# Patient Record
Sex: Female | Born: 1945 | Race: White | Hispanic: No | Marital: Married | Smoking: Never smoker
Health system: Southern US, Community
[De-identification: ages and names within clinical notes are randomized; demographics above are authoritative.]

## PROBLEM LIST (undated history)

## (undated) DIAGNOSIS — E785 Hyperlipidemia, unspecified: Secondary | ICD-10-CM

## (undated) DIAGNOSIS — IMO0001 Reserved for inherently not codable concepts without codable children: Secondary | ICD-10-CM

## (undated) DIAGNOSIS — F419 Anxiety disorder, unspecified: Secondary | ICD-10-CM

## (undated) DIAGNOSIS — I10 Essential (primary) hypertension: Secondary | ICD-10-CM

## (undated) DIAGNOSIS — E559 Vitamin D deficiency, unspecified: Secondary | ICD-10-CM

## (undated) DIAGNOSIS — C4490 Unspecified malignant neoplasm of skin, unspecified: Secondary | ICD-10-CM

## (undated) DIAGNOSIS — M549 Dorsalgia, unspecified: Secondary | ICD-10-CM

## (undated) DIAGNOSIS — E669 Obesity, unspecified: Secondary | ICD-10-CM

## (undated) DIAGNOSIS — F32A Depression, unspecified: Secondary | ICD-10-CM

## (undated) DIAGNOSIS — K219 Gastro-esophageal reflux disease without esophagitis: Secondary | ICD-10-CM

## (undated) DIAGNOSIS — N189 Chronic kidney disease, unspecified: Secondary | ICD-10-CM

## (undated) DIAGNOSIS — R609 Edema, unspecified: Secondary | ICD-10-CM

## (undated) DIAGNOSIS — H269 Unspecified cataract: Secondary | ICD-10-CM

## (undated) DIAGNOSIS — M1990 Unspecified osteoarthritis, unspecified site: Secondary | ICD-10-CM

## (undated) DIAGNOSIS — H532 Diplopia: Secondary | ICD-10-CM

## (undated) DIAGNOSIS — T7840XA Allergy, unspecified, initial encounter: Secondary | ICD-10-CM

## (undated) DIAGNOSIS — R0602 Shortness of breath: Secondary | ICD-10-CM

## (undated) HISTORY — DX: Hyperlipidemia, unspecified: E78.5

## (undated) HISTORY — PX: BREAST BIOPSY: SHX20

## (undated) HISTORY — DX: Gastro-esophageal reflux disease without esophagitis: K21.9

## (undated) HISTORY — DX: Dorsalgia, unspecified: M54.9

## (undated) HISTORY — DX: Edema, unspecified: R60.9

## (undated) HISTORY — PX: POLYPECTOMY: SHX149

## (undated) HISTORY — DX: Shortness of breath: R06.02

## (undated) HISTORY — DX: Allergy, unspecified, initial encounter: T78.40XA

## (undated) HISTORY — DX: Chronic kidney disease, unspecified: N18.9

## (undated) HISTORY — DX: Diplopia: H53.2

## (undated) HISTORY — DX: Unspecified malignant neoplasm of skin, unspecified: C44.90

## (undated) HISTORY — DX: Reserved for inherently not codable concepts without codable children: IMO0001

## (undated) HISTORY — DX: Anxiety disorder, unspecified: F41.9

## (undated) HISTORY — DX: Depression, unspecified: F32.A

## (undated) HISTORY — PX: OTHER SURGICAL HISTORY: SHX169

## (undated) HISTORY — DX: Unspecified cataract: H26.9

## (undated) HISTORY — PX: TOTAL KNEE ARTHROPLASTY: SHX125

## (undated) HISTORY — DX: Unspecified osteoarthritis, unspecified site: M19.90

## (undated) HISTORY — DX: Vitamin D deficiency, unspecified: E55.9

## (undated) HISTORY — DX: Essential (primary) hypertension: I10

## (undated) HISTORY — DX: Obesity, unspecified: E66.9

---
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
## 1997-12-11 HISTORY — PX: KNEE ARTHROSCOPY: SUR90

## 1998-06-16 ENCOUNTER — Ambulatory Visit (HOSPITAL_COMMUNITY): Admission: RE | Admit: 1998-06-16 | Discharge: 1998-06-16 | Payer: Self-pay | Admitting: Obstetrics & Gynecology

## 1999-02-24 ENCOUNTER — Ambulatory Visit (HOSPITAL_BASED_OUTPATIENT_CLINIC_OR_DEPARTMENT_OTHER): Admission: RE | Admit: 1999-02-24 | Discharge: 1999-02-24 | Payer: Self-pay | Admitting: Orthopedic Surgery

## 1999-07-18 ENCOUNTER — Other Ambulatory Visit: Admission: RE | Admit: 1999-07-18 | Discharge: 1999-07-18 | Payer: Self-pay | Admitting: Obstetrics and Gynecology

## 1999-07-25 ENCOUNTER — Ambulatory Visit (HOSPITAL_COMMUNITY): Admission: RE | Admit: 1999-07-25 | Discharge: 1999-07-25 | Payer: Self-pay | Admitting: Obstetrics and Gynecology

## 1999-07-25 ENCOUNTER — Encounter: Payer: Self-pay | Admitting: Obstetrics and Gynecology

## 1999-12-12 HISTORY — PX: ABDOMINAL HYSTERECTOMY: SHX81

## 2000-08-15 ENCOUNTER — Other Ambulatory Visit: Admission: RE | Admit: 2000-08-15 | Discharge: 2000-08-15 | Payer: Self-pay | Admitting: Obstetrics and Gynecology

## 2000-09-18 ENCOUNTER — Encounter: Admission: RE | Admit: 2000-09-18 | Discharge: 2000-09-18 | Payer: Self-pay | Admitting: Occupational Medicine

## 2000-09-18 ENCOUNTER — Encounter: Payer: Self-pay | Admitting: Occupational Medicine

## 2000-09-24 ENCOUNTER — Encounter (INDEPENDENT_AMBULATORY_CARE_PROVIDER_SITE_OTHER): Payer: Self-pay

## 2000-09-24 ENCOUNTER — Inpatient Hospital Stay (HOSPITAL_COMMUNITY): Admission: RE | Admit: 2000-09-24 | Discharge: 2000-09-26 | Payer: Self-pay | Admitting: Obstetrics and Gynecology

## 2001-09-20 ENCOUNTER — Encounter: Payer: Self-pay | Admitting: Family Medicine

## 2001-09-20 ENCOUNTER — Encounter: Admission: RE | Admit: 2001-09-20 | Discharge: 2001-09-20 | Payer: Self-pay | Admitting: Family Medicine

## 2001-11-06 ENCOUNTER — Other Ambulatory Visit: Admission: RE | Admit: 2001-11-06 | Discharge: 2001-11-06 | Payer: Self-pay | Admitting: Obstetrics and Gynecology

## 2002-10-02 ENCOUNTER — Encounter: Payer: Self-pay | Admitting: Family Medicine

## 2002-10-02 ENCOUNTER — Encounter: Admission: RE | Admit: 2002-10-02 | Discharge: 2002-10-02 | Payer: Self-pay | Admitting: Family Medicine

## 2002-11-10 ENCOUNTER — Other Ambulatory Visit: Admission: RE | Admit: 2002-11-10 | Discharge: 2002-11-10 | Payer: Self-pay | Admitting: Obstetrics and Gynecology

## 2004-01-06 ENCOUNTER — Encounter: Admission: RE | Admit: 2004-01-06 | Discharge: 2004-01-06 | Payer: Self-pay | Admitting: Occupational Medicine

## 2005-01-06 ENCOUNTER — Encounter: Admission: RE | Admit: 2005-01-06 | Discharge: 2005-01-06 | Payer: Self-pay | Admitting: Family Medicine

## 2005-12-11 HISTORY — PX: CARDIAC CATHETERIZATION: SHX172

## 2005-12-11 HISTORY — PX: LAPAROSCOPIC CHOLECYSTECTOMY: SUR755

## 2006-01-22 ENCOUNTER — Encounter: Admission: RE | Admit: 2006-01-22 | Discharge: 2006-01-22 | Payer: Self-pay | Admitting: Family Medicine

## 2006-05-01 ENCOUNTER — Observation Stay (HOSPITAL_COMMUNITY): Admission: EM | Admit: 2006-05-01 | Discharge: 2006-05-01 | Payer: Self-pay | Admitting: Emergency Medicine

## 2006-05-20 ENCOUNTER — Emergency Department (HOSPITAL_COMMUNITY): Admission: EM | Admit: 2006-05-20 | Discharge: 2006-05-20 | Payer: Self-pay | Admitting: Emergency Medicine

## 2006-08-01 ENCOUNTER — Ambulatory Visit (HOSPITAL_COMMUNITY): Admission: RE | Admit: 2006-08-01 | Discharge: 2006-08-02 | Payer: Self-pay | Admitting: Surgery

## 2006-08-01 ENCOUNTER — Encounter (INDEPENDENT_AMBULATORY_CARE_PROVIDER_SITE_OTHER): Payer: Self-pay | Admitting: *Deleted

## 2007-01-22 ENCOUNTER — Ambulatory Visit: Payer: Self-pay | Admitting: Internal Medicine

## 2007-01-23 ENCOUNTER — Encounter: Admission: RE | Admit: 2007-01-23 | Discharge: 2007-01-23 | Payer: Self-pay | Admitting: Family Medicine

## 2007-02-07 ENCOUNTER — Ambulatory Visit: Payer: Self-pay | Admitting: Internal Medicine

## 2007-02-07 DIAGNOSIS — K5730 Diverticulosis of large intestine without perforation or abscess without bleeding: Secondary | ICD-10-CM | POA: Insufficient documentation

## 2007-04-20 ENCOUNTER — Emergency Department (HOSPITAL_COMMUNITY): Admission: EM | Admit: 2007-04-20 | Discharge: 2007-04-20 | Payer: Self-pay | Admitting: Emergency Medicine

## 2007-04-24 ENCOUNTER — Encounter: Admission: RE | Admit: 2007-04-24 | Discharge: 2007-04-24 | Payer: Self-pay | Admitting: Family Medicine

## 2007-05-15 ENCOUNTER — Emergency Department (HOSPITAL_COMMUNITY): Admission: EM | Admit: 2007-05-15 | Discharge: 2007-05-15 | Payer: Self-pay | Admitting: Emergency Medicine

## 2007-07-22 ENCOUNTER — Ambulatory Visit: Payer: Self-pay | Admitting: Internal Medicine

## 2007-07-25 ENCOUNTER — Ambulatory Visit: Payer: Self-pay | Admitting: Internal Medicine

## 2007-07-25 ENCOUNTER — Encounter: Payer: Self-pay | Admitting: Internal Medicine

## 2007-07-25 DIAGNOSIS — K2100 Gastro-esophageal reflux disease with esophagitis, without bleeding: Secondary | ICD-10-CM | POA: Insufficient documentation

## 2007-07-25 DIAGNOSIS — K449 Diaphragmatic hernia without obstruction or gangrene: Secondary | ICD-10-CM | POA: Insufficient documentation

## 2008-01-27 ENCOUNTER — Encounter: Admission: RE | Admit: 2008-01-27 | Discharge: 2008-01-27 | Payer: Self-pay | Admitting: Family Medicine

## 2008-03-14 DIAGNOSIS — I10 Essential (primary) hypertension: Secondary | ICD-10-CM | POA: Insufficient documentation

## 2008-03-14 DIAGNOSIS — F411 Generalized anxiety disorder: Secondary | ICD-10-CM | POA: Insufficient documentation

## 2008-03-14 DIAGNOSIS — F329 Major depressive disorder, single episode, unspecified: Secondary | ICD-10-CM | POA: Insufficient documentation

## 2008-03-14 DIAGNOSIS — K219 Gastro-esophageal reflux disease without esophagitis: Secondary | ICD-10-CM | POA: Insufficient documentation

## 2009-07-16 ENCOUNTER — Encounter: Admission: RE | Admit: 2009-07-16 | Discharge: 2009-07-16 | Payer: Self-pay | Admitting: Family Medicine

## 2009-12-11 DIAGNOSIS — IMO0001 Reserved for inherently not codable concepts without codable children: Secondary | ICD-10-CM

## 2009-12-11 HISTORY — DX: Reserved for inherently not codable concepts without codable children: IMO0001

## 2010-05-31 ENCOUNTER — Emergency Department (HOSPITAL_COMMUNITY): Admission: EM | Admit: 2010-05-31 | Discharge: 2010-05-31 | Payer: Self-pay | Admitting: Emergency Medicine

## 2010-06-02 ENCOUNTER — Ambulatory Visit: Payer: Self-pay | Admitting: Cardiology

## 2010-06-02 ENCOUNTER — Observation Stay (HOSPITAL_COMMUNITY): Admission: EM | Admit: 2010-06-02 | Discharge: 2010-06-03 | Payer: Self-pay | Admitting: Emergency Medicine

## 2010-06-03 ENCOUNTER — Encounter (INDEPENDENT_AMBULATORY_CARE_PROVIDER_SITE_OTHER): Payer: Self-pay | Admitting: Emergency Medicine

## 2010-07-18 ENCOUNTER — Encounter: Admission: RE | Admit: 2010-07-18 | Discharge: 2010-07-18 | Payer: Self-pay | Admitting: Family Medicine

## 2010-07-25 ENCOUNTER — Telehealth: Payer: Self-pay | Admitting: Internal Medicine

## 2010-07-25 ENCOUNTER — Encounter (INDEPENDENT_AMBULATORY_CARE_PROVIDER_SITE_OTHER): Payer: Self-pay | Admitting: *Deleted

## 2010-07-27 ENCOUNTER — Ambulatory Visit: Payer: Self-pay | Admitting: Internal Medicine

## 2010-08-05 ENCOUNTER — Ambulatory Visit (HOSPITAL_COMMUNITY): Admission: RE | Admit: 2010-08-05 | Discharge: 2010-08-05 | Payer: Self-pay | Admitting: Internal Medicine

## 2011-01-01 ENCOUNTER — Encounter: Payer: Self-pay | Admitting: Family Medicine

## 2011-01-12 NOTE — Progress Notes (Signed)
Summary: Appt ssoner than next available  Phone Note From Other Clinic   Caller: Raynelle Fanning @ Dr Maurice Small [phone] direct line ok to leave info if no answer Call For: Dr Juanda Chance Summary of Call: Would like pt seen sooner than next available in Nov - PA would be ok. Non Cardiac chest pain, increased Nexium to two times a day with no hlep. Screen for Endo.  Initial call taken by: Leanor Kail [facility],  July 25, 2010 12:35 PM  Follow-up for Phone Call        I have left a voicemail with Raynelle Fanning for appointment 07/27/10 10:15.   Follow-up by: Darcey Nora RN, CGRN,  July 25, 2010 1:51 PM

## 2011-01-12 NOTE — Assessment & Plan Note (Signed)
Summary: non cardiac chest pain-sheri   History of Present Illness Visit Type: Initial Consult Primary GI MD: Lina Sar MD Primary Janard Culp: Maurice Small, MD Requesting Brinley Treanor: Maurice Small, MD Chief Complaint: chest pain non-cardiac, burning intense x 2 months History of Present Illness:   This is a 65 year old white female with recent acute substernal chest pain and left precordial chest pain evaluated in the emergency room. She still has some remaining tenderness in the rib cage. Cardiac causes have been ruled out.  She had noncardiac chest pain in 2008 for which she underwent an upper endoscopy in August 2008 with findings of a 2 cm hiatal hernia and mild esophagitis. She was put on Nexium and Zantac. She continued to take Nexium one a day. On the night before the episode of chest pain, she had a chocolate sundae at [facility] cream shop, went to bed and woke up in the morning with  belching and  pain and indigestion. It reminded her of her gallbladder attack. She responded to a GI cocktail while in the emergency room. She since then has been increased to twice a day Nexium and has not had any recurrence of the pain. There is a family history of colon cancer in her father and sister. Prior colonoscopies in 2003 and 2008 showed mild diverticulosis of the left colon. She had a laparoscopic cholecystectomy in 2007.   GI Review of Systems    Reports acid reflux, belching, chest pain, and  nausea.      Denies abdominal pain, bloating, dysphagia with liquids, dysphagia with solids, heartburn, loss of appetite, vomiting, vomiting blood, weight loss, and  weight gain.      Reports constipation and  diarrhea.     Denies anal fissure, black tarry stools, change in bowel habit, diverticulosis, fecal incontinence, heme positive stool, hemorrhoids, irritable bowel syndrome, jaundice, light color stool, liver problems, rectal bleeding, and  rectal pain.    Current Medications (verified): 1)   Quinapril Hcl 40 Mg Tabs (Quinapril Hcl) .... Take 1 Tablet Once Daily 2)  Amlodipine Besylate 10 Mg Tabs (Amlodipine Besylate) .... Take 1 Tablet By Mouth Once Daily 3)  Metoprolol Tartrate 25 Mg Tabs (Metoprolol Tartrate) .... Take  1 Tablet By Mouth Once Daily 4)  Nexium 40 Mg Cpdr (Esomeprazole Magnesium) .... Two Times A Day 5)  Ibuprofen 400 Mg Tabs (Ibuprofen) .... As Needed  Allergies (verified): 1)  ! Penicillin  Past History:  Past Medical History: Reviewed history from 03/14/2008 and no changes required. Current Problems:  GERD (ICD-530.81) DEPRESSION (ICD-311) ANXIETY (ICD-300.00) HYPERTENSION (ICD-401.9) ESOPHAGITIS, REFLUX (ICD-530.11) HIATAL HERNIA (ICD-553.3) Family Hx of ADENOCARCINOMA, COLON, FAMILY HX (ICD-V16.0) DIVERTICULOSIS, COLON (ICD-562.10)  Past Surgical History: appendectomy cholecystectomy Hysterectomy  Family History: Reviewed history from 07/25/2010 and no changes required. Family History of Colon Cancer: Father, Sister Family History of Colon Polyps: Brother, Sister Family History of Heart Disease: Mother, Father Family History of Prostate Cancer: Father Family History of Diabetes:   Social History: Alcohol Use - no Illicit Drug Use - no Occupation: Retired Daily Caffeine Use  Review of Systems       The patient complains of anxiety-new, arthritis/joint pain, back pain, depression-new, fatigue, headaches-new, itching, and swelling of feet/legs.  The patient denies allergy/sinus, anemia, blood in urine, breast changes/lumps, change in vision, confusion, cough, coughing up blood, fainting, fever, hearing problems, heart murmur, heart rhythm changes, menstrual pain, muscle pains/cramps, night sweats, nosebleeds, pregnancy symptoms, shortness of breath, skin rash, sleeping problems, sore throat,  swollen lymph glands, thirst - excessive , urination - excessive , urination changes/pain, urine leakage, vision changes, and voice change.          Pertinent positive and negative review of systems were noted in the above HPI. All other ROS was otherwise negative.   Vital Signs:  Patient profile:   65 year old female Height:      66 inches Weight:      239.50 pounds BMI:     38.80 Pulse rate:   64 / minute Pulse rhythm:   regular BP sitting:   122 / 80  (left arm) Cuff size:   large  Vitals Entered By: June McMurray CMA Duncan Dull) (July 27, 2010 10:23 AM)  Physical Exam  General:  alert, oriented and in no distress, overweight. Eyes:  PERRLA, no icterus. Mouth:  No deformity or lesions, dentition normal. Neck:  Supple; no masses or thyromegaly. Chest Wall:  mild tenderness over the costochondral junctions in the left precordial tissues including the breasts. Lungs:  Clear throughout to auscultation. Heart:  Regular rate and rhythm; no murmurs, rubs,  or bruits. Abdomen:  soft protuberant abdomen but no tenderness. No mass. Normoactive bowel sounds. Left lower and right lower quadrants unremarkable. Extremities:  No clubbing, cyanosis, edema or deformities noted. Skin:  Intact without significant lesions or rashes. Psych:  Alert and cooperative. Normal mood and affect.   Impression & Recommendations:  Problem # 1:  GERD (ICD-530.81)  Patient has noncardiac chest pain almost certainly due to gastroesophageal reflux based on the symptoms and the history  preceding the episode .The fact that cardiac causes have been ruled out. She had a small hiatal hernia 3 years ago. She might have developed esophageal spasms as a result of the reflux or possibly have an enlarged hiatal hernia. I will schedule for for a barium esophagram to assess the esophageal motility, rule out spasm and also quantitate the reflux. She will stay on Nexium 40 mg twice a day for now and I have given her samples of it. I have also discussed head of the bed elevation at night and antireflux measures in general, weight loss included. I would not rush into a Nissen  fundoplication because she can improve her lifestyle and reduce her reflux without surgical intervention. Should we decide for Nissen fundoplication, she would need esophageal motility testing and re endoscopy.   Orders: Barium Swallow (Barium Swallow)  Problem # 2:  Family Hx of ADENOCARCINOMA, COLON, FAMILY HX (ICD-V16.0) Patient will be due for a recall colonoscopy in February 2013.  Patient Instructions: 1)  Continue Nexium 40 mg twice a day. 2)  Strict antireflux measures. 3)  Barium esophagram. 4)  Recall colonoscopy February 2013. 5)  Copy sent to : Dr Maurice Small 6)  The medication list was reviewed and reconciled.  All changed / newly prescribed medications were explained.  A complete medication list was provided to the patient / caregiver.  Appended Document: non cardiac chest pain-sheri please call pt with results, normal exam, no significant reflux noted during this particular exam. Continue Nexium, please ( two times a day). If Sx,s continue, will need upper endoscopy. She needs to call us within 4-6 weeks.  Appended Document: non cardiac chest pain-sheri Patient notified of radiology results and Dr Regino Schultze recommendations.  She will call back with an update.

## 2011-01-12 NOTE — Progress Notes (Signed)
Summary: Office Visit- [facility]  Office Visit- [facility]   Imported By: Harlow Mares CMA (AAMA) 07/27/2010 10:31:20  _____________________________________________________________________  External Attachment:    Type:   Image     Comment:   External Document

## 2011-02-26 LAB — DIFFERENTIAL
Basophils Absolute: 0 10*3/uL (ref 0.0–0.1)
Lymphocytes Relative: 23 % (ref 12–46)
Monocytes Absolute: 0.4 10*3/uL (ref 0.1–1.0)
Monocytes Relative: 5 % (ref 3–12)

## 2011-02-26 LAB — CBC: Platelets: 226 10*3/uL (ref 150–400)

## 2011-02-26 LAB — POCT CARDIAC MARKERS
CKMB, poc: <1 ng/mL — ABNORMAL LOW (ref 1.0–8.0)
CKMB, poc: <1 ng/mL — ABNORMAL LOW (ref 1.0–8.0)
CKMB, poc: <1 ng/mL — ABNORMAL LOW (ref 1.0–8.0)
Myoglobin, poc: 34.1 ng/mL (ref 12–200)
Myoglobin, poc: 56.3 ng/mL (ref 12–200)
Myoglobin, poc: 59.7 ng/mL (ref 12–200)
Troponin i, poc: <0.05 ng/mL (ref 0.00–0.09)
Troponin i, poc: <0.05 ng/mL (ref 0.00–0.09)
Troponin i, poc: <0.05 ng/mL (ref 0.00–0.09)

## 2011-02-26 LAB — POCT I-STAT, CHEM 8
Calcium, Ion: 1.04 mmol/L — ABNORMAL LOW (ref 1.12–1.32)
Chloride: 106 mEq/L (ref 96–112)
Chloride: 108 mEq/L (ref 96–112)
Glucose, Bld: 132 mg/dL — ABNORMAL HIGH (ref 70–99)
Glucose, Bld: 140 mg/dL — ABNORMAL HIGH (ref 70–99)
HCT: 45 % (ref 36.0–46.0)
HCT: 46 % (ref 36.0–46.0)
Potassium: 3.9 mEq/L (ref 3.5–5.1)
Potassium: 4 mEq/L (ref 3.5–5.1)
TCO2: 20 mmol/L (ref 0–100)

## 2011-04-25 NOTE — Assessment & Plan Note (Signed)
[facility]                         GASTROENTEROLOGY OFFICE NOTE   NAME:[name], [name]                    MRN:          [id number]  DATE:07/22/2007                            DOB:          [birth year]    Ms. Heese is a very nice, 65 year old patient of Dr. Valentina Lucks whom we  saw in the past for colorectal screening. She has a positive family  history of colon cancer in her father and sister. Her last colonoscopy  in February 2008 showed diverticulosis of the left colon but no polyps.  She is here today because of episode of epigastric pain which occurred  in June 2008. It occurred at night as well as during the day and it was  a burning sensation. She has been under a lot of stress with her aging  mother and took some Xanax to relax but the pain continued. Dr. Valentina Lucks  referred her for a cardiology consultation and she was seen by Dr.  Elsie Lincoln. Her coronary arteries were normal on cardiac catheterization in  the past. She underwent a laparoscopic cholecystectomy for acute  cholecystitis by Dr. Daphine Deutscher in August 2007. The pain in June of this  year was similar to the gallbladder pain. She has decreased her fat  intake and modified her diet. She also followed Dr. Jone Baseman  recommendations to add ranitidine 150 mg to her regimen of Nexium 40 mg  a day. This seemed to have helped some although she still had some  discomfort in the epigastrium. She does not smoke but takes ibuprofen 2  at a time several times a week for headaches.   MEDICATIONS:  1. Quinapril 40 mg p.o. daily.  2. Metoprolol 25 mg p.o. daily.  3. Nexium 40 mg p.o. daily.  4. Ranitidine 150 mg p.o. daily.   PAST MEDICAL HISTORY:  Significant for high blood pressure, anxiety,  depression.   PAST SURGICAL HISTORY:  Laparoscopic cholecystectomy and appendectomy.   FAMILY HISTORY:  Positive for colon cancer in father and sister. Colon  polyps in brother and sister. Prostate cancer in  father. Heart disease  in mother and father.   SOCIAL HISTORY:  Married with one child. Retired as a Arboriculturist. She does not smoke and does not drink alcohol.   REVIEW OF SYSTEMS:  Positive for eyeglasses,  frequent cough, arthritic  complaints.   PHYSICAL EXAMINATION:  Blood pressure 158/90, pulse 60 and weight 238  pounds.  She was alert and oriented in no distress. She was clearly overweight.  LUNGS:  Clear to auscultation.  COR:  Normal S1, normal S2.  ABDOMEN:  Protuberant, soft, very tender in subxiphoid and midline  epigastric area not radiating to the left or right upper quadrant. The  lower abdomen was normal. Post cholecystectomy, a surgical scar was  around the umbilicus.  EXTREMITIES:  Trace edema.   IMPRESSION:  A 65 year old white female with epigastric discomfort  status post remote laparoscopic cholecystectomy. Her pain is now  suggestive of gastritis because of its location in the midline, although  I cannot entirely rule out the possibility of retained common bile duct  stone. Her intraoperative cholangiogram after lap chole was normal. Her  pain is really more consistent with ibuprofen-induced gastropathy or  possibly due to hiatal hernia with reflux.   PLAN:  1. Upper endoscopy scheduled.  2. Nexium 40 mg at bedtime and Zantac 150 mg q.a.m.  3. Avoid ibuprofen.  4. Antireflux measures and dietary modifications. Also change the      eating time at the end of the day to give her at least 2 hours      prior to laying down. Depending on the results of the upper      endoscopy, I would consider adding Reglan to the regimen.     Hedwig Morton. Juanda Chance, MD  Electronically Signed    DMB/MedQ  DD: 07/22/2007  DT: 07/23/2007  Job #: [id number]   cc:   Gretta Arab. Valentina Lucks, M.D.

## 2011-04-28 NOTE — Op Note (Signed)
[facility]  Patient:    [name], [name]            MRN: [id number] Proc. Date: 09/24/00 Adm. Date:  [id number] Attending:  Oliver Pila                           Operative Report  PREOPERATIVE DIAGNOSES: 1. Menorrhagia. 2. Fibroid uterus. 3. History of pelvic inflammatory disease with Dalkon shield.  POSTOPERATIVE DIAGNOSES: 1. Menorrhagia. 2. Fibroid uterus. 3. History of pelvic inflammatory disease with Dalkon shield. 4. Extensive pelvic adhesions.  PROCEDURE:  Total abdominal hysterectomy and bilateral salpingo-oophorectomy.  SURGEON:  Alvino Chapel, M.D.  ASSISTANT:  Malachi Pro. Ambrose Mantle, M.D.  ANESTHESIA:  General.  ESTIMATED BLOOD LOSS:  400 cc.  URINE OUTPUT:  Approximately 150 cc of clear urine.  IV FLUIDS:  Approximately 2500 cc LR.  FINDINGS:  The uterus had a large _______ fibroid, approximately 5 cm, and there was a bilateral hydrosalpinx noted with the tubes and ovaries scarred into the pelvis and the cul-de-sac.  There were also some large bowel adhesions to the adnexa and uterus.  DESCRIPTION OF PROCEDURE:  The patient was taken to the operating room where general anesthesia was obtained without difficulty.  She was then prepped and draped in the normal sterile fashion in the dorsal supine position.  A Pfannenstiel skin incision was then made with the scalpel and carried through to the underlying layered fascia with Bovie cautery.  The fascia was then nicked in the midline and the incision was extended laterally with Mayo scissors.  The inferior aspect of the fascia was then grasped with the Kocher clamps, elevated, and dissected off the underlying rectus muscles.  In a similar fashion, the superior aspect of the fascia was elevated with Kocher clamps and dissected off the rectus muscles.  The rectus muscles were then separated in the midline, the peritoneum identified, grasped with hemostats x 2, and  entered sharply with Metzenbaum scissors.  The peritoneal incision was then extended both superiorly and inferiorly with careful attention to avoid both bowel and bladder.  The Balfour retractor was then placed within the incision and the bowel packed away with moist laparotomy sponges.  With the bladder blade in place, the pelvis was inspected with the findings of adhesions as previously stated.  Initially, the adhesions of the descending colon were taken down with Metzenbaum scissors from the patients left adnexal region.  This freed the left hydrosalpinx significantly and it was able to be elevated.  Two curved Kelly clamps were then placed on the uterine cornua and the uterus was elevated slightly.  The patients right round ligament was then transected with Bovie cautery and the retroperitoneal space opened laterally.  The infundibulopelvic ligament was isolated on the patients right, doubly clamped with curved Zeppelin clamps, and suture ligated x 2 with 0 Vicryl.  This pedicle was hemostatic and attention was turned to the right adnexa.  The right adnexa was quite adherent into the cul-de-sac and could not be freed at this time.  Attention was then turned to the patients left, where the round ligament was transected with Bovie cautery in a similar fashion and the retroperitoneal space opened with Bovie cautery.  The infundibulopelvic ligament was also able to be isolated with two ________ which was then transected and suture ligated x 2 with 0 Vicryl.  The bladder flap was then initially developed with Metzenbaum scissors and the uterine arteries skeletonized.  The uterine arteries were then clamped bilaterally, transected, and suture ligated with 0 Vicryl.  At this point, secondary to poor visibility, the decision was made to proceed with removal of the uterine fundus which was trimmed away with Jorgenson scissors and the cervix was grasped with straight Kocher clamps.  The  bladder flap was then further developed with some scarring noted on the anterior surface of the cervix.  However, this was adequately taken down, and the remainder of the cervix was removed with straight Zeppelin clamps in a sequential fashion along the lateral edge at each step.  There was a ligature of 0 Vicryl.  At the level of the vaginal cuff, the cervix was clamped across with right angle Zeppelin clamps and trimmed away with Jorgenson scissors. The vaginal cuff was then secured with 0 Vicryl sutures at each angle, and several additional figure-of-eight sutures were placed in the middle to assure adequate hemostasis.  Attention was then turned to an area of mesentery which was bleeding.  This was grasped with a right angle clamp and suture ligated with 3-0 Vicryl with good hemostasis noted.  Finally, attention was turned to the patients right adnexa.  The retroperitoneal space was further opened and ureter clearly identified.  The right adnexa was then able to be bluntly removed without difficulty.  This was handed off with the specimen and the abdomen was irrigated x 2.  There was no significant active bleeding.  Some small areas of oozing were controlled with Bovie cautery.  The infundibulopelvic ligaments were once again inspected and found to be hemostatic, and the mesenteric area then suture ligated and was also hemostatic.  Therefore, all sponge and instruments were removed from the abdomen and the omentum pulled over the intestine.  The fascia was then closed with 0 Vicryl in a running fashion and the skin was closed with staples.  Sponge, lap, and needle counts were correct x 2, and the patient was taken to the recovery room in stable condition. DD:  09/24/00 TD:  09/24/00 Job: [id number]

## 2011-04-28 NOTE — Discharge Summary (Signed)
[facility]  Patient:    [name], REIGER            MRN: [id number] Adm. Date:  [id number] Disc. Date: 09/26/00 Attending:  Oliver Pila                           Discharge Summary  DISCHARGE DIAGNOSES: 1. Status post total abdominal hysterectomy and bilateral salpingo-oophorectomy. 2. Menorrhagia. 3. Fibroid uterus. 4. History of pelvic inflammatory disease.  DISCHARGE MEDICATIONS: 1. Motrin 600 mg p.o. q.6h. p.r.n. 2. Percocet one to two tablets p.o. q.4h. p.r.n. 3. Premarin one tablet 0.625 mg p.o. q.d.  DISCHARGE FOLLOWUP:  The patient is to follow up in my office on Friday, October 19 for staple removal.  HOSPITAL COURSE:  The patient is a 65 year old, G1, P1, who is admitted for a total abdominal hysterectomy and bilateral salpingo-oophorectomy secondary to significant and longstanding menorrhagia which failed oral management and previous D & C.  She underwent a total abdominal hysterectomy and bilateral salpingo-oophorectomy on September 24, 2000, without difficulty and was then admitted for routine postoperative care.  She did very well postoperatively. On postop day #2, she was tolerating regular diet, passing flatus, her pain was well controlled, and she was voiding without difficulty.  She was afebrile with stable vital signs.  Her incision was well approximated with no erythema or exudate; therefore, she was discharged to home with follow-up and prescriptions as previously stated.  DISCHARGE CONDITION:  Improved.  DISCHARGE LABORATORIES:  Postoperative hemoglobin was 10.6, hematocrit 30.4. DD:  09/26/00 TD:  09/26/00 Job: [id number]

## 2011-04-28 NOTE — H&P (Signed)
[facility]  Patient:    [name], [name]                      MRN: [id number] Adm. Date:  09/24/00 Attending:  Alvino Chapel, M.D. CC:         [facility] OR 09/24/00   History and Physical  PREOPERATIVE HISTORY AND PHYSICAL  HISTORY OF PRESENT ILLNESS:  The patient is a 65 year old G1, P1, who is admitted for a scheduled total abdominal hysterectomy and bilateral salpingo-oophorectomy given a long history of menorrhagia and some dysmenorrhea as well.  In the past, patient has been treated with D&C and oral contraceptives which failed to control her problem.  Furthermore, on oral contraceptive pills, patient had severe migraine headaches.  PAST MEDICAL HISTORY:  None.  PAST SURGICAL HISTORY:  She had a knee surgery in March 2000, a [facility] July 1999 and an appendectomy.  PAST GYNECOLOGIC HISTORY:  There are no abnormal Pap smears.  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  PAST OBSTETRICAL HISTORY:  Normal spontaneous vaginal delivery.  FAMILY HISTORY:  History of colon cancer in the father.  No family history of breast cancer.  MEDICATIONS:  The patient has no medications.  SOCIAL HISTORY:  She is a nonsmoker.  ALLERGIES:  PENICILLIN only.  PHYSICAL EXAMINATION:  VITAL SIGNS:  Weight 233 pounds.  Blood pressure 155/90.  CARDIAC:  Regular rate and rhythm.  LUNGS:  Clear to auscultation bilaterally.  ABDOMEN:  Slightly obese, nontender.  PELVIC:  Exam reveals normal external genitalia.  Cervix is without lesions. Uterus is approximately 12-weekssize, retroverted with very little descensus.  Adnexa are nonpalpable.  RECTAL:  Heme negative.  BREASTS:  Exam was normal with no masses, adenopathy or discharge.  LABORATORY DATA:  Hemoglobin 13.6.  ASSESSMENT/PLAN:  The patient was counseled as to all available options, given history of fibroid uterus and menorrhagia which  has been unresponsive to dilatation and curettage and oral contraceptive therapy.  Given that the uterus is greater than 10 weeks in size and there are distorting fibroids, it is unlikely that she will benefit from ThermaChoice ablation.  Therefore, the patient requests proceed with hysterectomy.  As a 65 year old woman, she elects to have an oophorectomy at the time of her hysterectomy and will proceed with a total abdominal hysterectomy given her history of pelvic inflammatory disease and a ruptured appendix with significant scarring possible.  The patient was counseled as to the risks and benefits of the surgery including bleeding, infection and possible damage to bowel and bladder.  The patient understands these risks and agrees to proceed. DD:  09/21/00 TD:  09/21/00 Job: [id number]

## 2011-04-28 NOTE — Consult Note (Signed)
NAMEJONQUIL, [name]             ACCOUNT NO.:  [id number]   MEDICAL RECORD NO.:  [id number]          PATIENT TYPE:  INP   LOCATION:  1824                         FACILITY:  [facility]   PHYSICIAN:  [name], M.D.DATE OF BIRTH:  May 11, 1946   DATE OF CONSULTATION:  05/01/2006  DATE OF DISCHARGE:                                   CONSULTATION   CHIEF COMPLAINT:  Left subscapular pain.   HISTORY OF PRESENT ILLNESS:  Ms. [name] is a 65 year old female whom we  were asked to see in consult in the emergency room from the [name]  group for left subscapular pain.  The patient complains of localized left  scapular pain off and on for the last 5 days.  Symptoms have increased over  the last 24 hours.  This morning, she felt funny with some flushing and  nausea.  She denies any shortness of breath or diaphoresis.  In the  emergency room, her exam is notable for tachycardia.  Initially, her blood  pressure was elevated by EMS, but is now normal.  The patient denies any  aggravation of her symptoms with exercise, and, in fact, her symptoms get  better when she walks.  She has taken Tums with intermittent relief, but not  this morning.   PAST MEDICAL HISTORY:  1.  Remarkable for dyslipidemia, which is better with Omega 3 treatment.  2.  She is status post hysterectomy in 2001.  3.  She is status post remote appendectomy and tonsillectomy.  4.  Her blood pressure has been somewhat labile in the past, but this has      never been treated.   ALLERGIES:  PENICILLIN, which has caused a rash.   SOCIAL HISTORY:  She has been married for 37 years.  She has never smoked.  She is retired from [employer] work.  She has 1 child and 2 grandchildren.   FAMILY HISTORY:  Remarkable in that her mother has a history of coronary  disease and is followed by Dr. Clarene [name].   REVIEW OF SYSTEMS:  Essentially unremarkable, except as noted above.  She  denies any thyroid problems, diabetes, GI bleeding  or kidney problems.  She  has had some tachycardia today when she was seen by EMS, but admits she was  anxious at that time.  She has not had syncope.   PHYSICAL EXAMINATION:  VITAL SIGNS:  Blood pressure 110/55, pulse 75,  temperature 89, respirations 12.  GENERAL:  She is a well-developed, well-nourished female, somewhat anxious,  but in no acute distress.  HEENT:  Normocephalic.  Extraocular movements are intact.  Sclerae are  nonicteric.  She does wear glasses.  NECK:  Without JVD and without bruit.  CHEST:  Clear to auscultation and percussion.  CARDIAC:  Regular rate and rhythm without obvious murmur, rub, or gallop.  Normal S1 and S2.  ABDOMEN:  The abdomen is not tender.  No hepatosplenomegaly.  EXTREMITIES:  Without edema.  Distal pulses are 2+/4.  NEUROLOGIC:  Grossly intact.  She is awake, alert, oriented and cooperative.  Moves all extremities without obvious deficit.  SKIN:  Warm and dry.   ELECTROCARDIOGRAM:  Sinus rhythm without acute changes.   LABORATORY DATA:  Essentially normal.   CHEST X-RAY:  No active disease.   IMPRESSION:  1.  Left scapular pain, rule out angina.  2.  Family history of coronary disease.  3.  History of dyslipidemia.   PLAN:  The patient was seen by Dr. Elsie [name] in the emergency room.  Outpatient  stress test would take at least a couple weeks.  Dr. Elsie [name] has offered her  a diagnostic catheterization this afternoon to clarify the issue.  The  patient is considering this.      [name], P.A.    ______________________________  [name], M.D.    [name]  D:  05/01/2006  T:  05/01/2006  Job:  [id number]

## 2011-04-28 NOTE — Discharge Summary (Signed)
[name], [name]             ACCOUNT NO.:  [id number]   MEDICAL RECORD NO.:  [id number]          PATIENT TYPE:  OBV   LOCATION:  2852                         FACILITY:  [facility]   PHYSICIAN:  [name], M.D.DATE OF BIRTH:  1946/07/23   DATE OF ADMISSION:  05/01/2006  DATE OF DISCHARGE:  05/01/2006                                 DISCHARGE SUMMARY   DISCHARGE DIAGNOSES:  1.  Subscapular pain worrisome for unstable angina, catheterization showing      normal coronaries and normal left ventricular function.  2.  Family history of coronary artery disease.  3.  Treated dyslipidemia.  4.  History of hypertension.   HOSPITAL COURSE:  The patient is a 65 year old female followed by Dr. Maurice Small who was admitted to the emergency room with left subscapular pain.  Please see admission history and physical for complete details.  She does  have risk factors for coronary artery disease.  She was seen by Dr. Elsie Lincoln,  and it was decided to proceed with a diagnostic catheterization.  Her  troponin was 0.05.  Her EKG showed nonspecific ST changes.  The patient was  given Lovenox once in the emergency room.  Catheterization done on May 01, 2006 by Dr. gamble revealed normal coronaries and normal LV function.  She  was discharged later on May 01, 2006.   DISCHARGE MEDICATIONS:  Prilosec OTC.   LABORATORIES:  EKG showed sinus rhythm, with nonspecific ST changes.  Chest  x-ray showed no pulmonary disease.  Sodium 139, potassium 3.6, BUN 12,  creatinine 0.8.  CK-MB and troponin are negative. Hemoglobin 15.6,  hematocrit 46.  Urinalysis unremarkable   DISPOSITION:  The patient is discharged stable condition.  She will follow  up with her family doctor as an outpatient.  We will see her on a p.r.n.  basis.      Abelino Derrick, P.A.    ______________________________  [name], M.D.    Lenard Lance  D:  05/23/2006  T:  05/23/2006  Job:  [id number]   cc:   Gretta Arab. Valentina Lucks,  M.D.  Fax: [phone]

## 2011-04-28 NOTE — Op Note (Signed)
[name], [name]             ACCOUNT NO.:  [id number]   MEDICAL RECORD NO.:  [id number]          PATIENT TYPE:  OIB   LOCATION:  1608                         [facility]ACILITY:  [facility]   PHYSICIAN:  Thornton Park. Daphine Deutscher, MD  DATE OF BIRTH:  [birth year]   DATE OF PROCEDURE:  08/01/2006  DATE OF DISCHARGE:                                 OPERATIVE REPORT   PREOPERATIVE DIAGNOSIS:  Chronic cholecystitis.   POSTOPERATIVE DIAGNOSIS:  Chronic cholecystitis.   PROCEDURE:  Laparoscopic cholecystectomy, intraoperative cholangiogram,  normal intraoperative cholangiogram.   SURGEON:  Thornton Park. Daphine Deutscher, MD   ASSISTANT:  Wilmon Arms. Tsuei, M.D.   ANESTHESIA:  General.   DESCRIPTION OF PROCEDURE:  [name] was taken to OR #1 August 01, 2006, given general anesthesia.  The abdomen was prepped with chlorhexidine  and draped sterilely.  A longitudinal incision was made down to the  umbilicus and the abdomen was entered without difficulty.  The abdomen was  insufflated through the Hasson and then three ports placed in he upper  abdomen.  The gallbladder was elevated, mobilized, and the cystic duct  dissected free.  I incised and inserted the Reddick catheter.  A dynamic  cholangiogram was done, which showed good intrahepatic filling and free flow  into the duodenum.  The cystic duct was then triple-clipped and divided, the  cystic artery was triple-clipped and divided, and the gallbladder is removed  without entering it from the gallbladder bed with the hook electrocautery.  It was placed in a bag and brought out through the umbilicus, although I did  have to enlarge my incision in the umbilicus because she had about at least  a 1.5 cm stone.  I then I went back in and surveyed the area and no bleeding  was noted.  No bile leaks were noted.  I repaired the umbilical defect with  the laparoscopic the upper position so I could watch myself and I got a good  closure of that.  The abdomen was  deflated, port sites were removed and  closed with 4-0 Vicryl, Benzoin and Steri-Strips.  The patient tolerated the  procedure well taken, was taken to the recovery room in satisfactory  addition.      Thornton Park Daphine Deutscher, MD  Electronically Signed     MBM/MEDQ  D:  08/01/2006  T:  08/01/2006  Job:  [id number]   cc:   Gretta Arab. Valentina Lucks, M.D.  Fax: [phone]

## 2011-04-28 NOTE — H&P (Signed)
[name], [name]             ACCOUNT NO.:  [id number]   MEDICAL RECORD NO.:  [id number]          PATIENT TYPE:  EMS   LOCATION:  MAJO                         FACILITY:  [facility]   PHYSICIAN:  Kela Millin, M.D.DATE OF BIRTH:  04-29-1946   DATE OF ADMISSION:  05/01/2006  DATE OF DISCHARGE:                                HISTORY & PHYSICAL   PRIMARY CARE PHYSICIAN:  Dr. Maurice Small.   CHIEF COMPLAINT:  Chest pain.   HISTORY OF PRESENT ILLNESS:  The patient is a 65 year old obese white female  with a past medical history significant for hyperlipidemia, who presents  with complaints of chest pain.  She states that initially, she had a pain in  her left shoulder blade area, and this radiated up to her neck.  She states  that overall, she just did not feel well and began experiencing chest pain,  left precordial, described as burning, intermittent, associated with nausea  but no vomiting.  The patient also reports that she has a lot of belching in  the past few weeks.  She reports that she felt dizzy as well.  She denies  diaphoresis and no associated shortness of breath.  The patient denies  cough, fevers, dysuria, melena, diarrhea, and no hematochezia.   The patient was seen in the ER, and EKG showed normal sinus rhythm with  nonspecific T wave changes.  Point-of-care markers were negative.  She was  admitted to the [facility] service for further evaluation and  management.   PAST MEDICAL HISTORY:  As stated above.   PAST SURGICAL HISTORY:  1.  Status post hysterectomy with BSO.  2.  History of right knee surgery.   MEDICATIONS:  Omega III, chondroitin sulfate, calcium, arthritis pain  reliever (over-the-counter).  She also states that she was started on Tricor  by her primary care physician, but she has not filled the prescription.  She  has been taking Omega III instead.   ALLERGIES:  PENICILLIN.   SOCIAL HISTORY:  She denies tobacco.  She also denies  alcohol.   FAMILY HISTORY:  Her father had colon cancer and congestive heart failure.  Mother has a history of angina but no MI.   REVIEW OF SYSTEMS:  As per HPI, other review of systems negative.   PHYSICAL EXAMINATION:  VITAL SIGNS:  Temperature 98.8, blood pressure  111/55, initially 161/99, and her pulse 75, respiratory rate 20.  O2 sat  98%.  GENERAL:  The patient is an older white female, obese, alert in no apparent  distress.  HEENT:  PERRL.  EOMI.  Sclerae are anicteric.  Moist mucous membranes.  No  oral exudates.  NECK:  Supple.  No thyromegaly.  No adenopathy.  LUNGS:  Clear to auscultation bilaterally.  No crackles or wheezes.  CARDIOVASCULAR:  Regular rate and rhythm.  Normal S1 and S2.  No murmurs or  gallops.  ABDOMEN:  Soft.  Normal bowel sounds present.  Nontender, nondistended.  No  organomegaly.  No masses palpable.  EXTREMITIES:  No cyanosis or edema.  NEUROLOGIC:  Alert and oriented x3.  Cranial nerves II-XII are  grossly  intact.  Nonfocal exam.   LABORATORY DATA:  EKG normal sinus rhythm.  Nonspecific T wave changes.   Point-of-care markers are negative.  Urinalysis is negative.  Her sodium is  139, potassium 3.6, chloride 108, BUN 12, glucose 117.  The pH is 7.43, pCO2  34.2, bicarb 23.1.  Hemoglobin is 15.6, hematocrit 46.  Her creatinine is  0.8.   ASSESSMENT/PLAN:  1.  Atypical chest pain:  As discussed above, the patient is an obese older      white female, status post total abdominal hysterectomy with BSO and      history of hyperlipidemia who presents with atypical chest pain.  Will      obtain serial cardiac enzymes and an EKG.  Place the patient on aspirin      and nitroglycerin.  Will consult cardiology for further risk      stratification.  Will cover with a PPI for possible GI etiology.  2.  Dyslipidemia:  Will recheck fasting lipid profile and follow.  3.  Obesity.      Kela Millin, M.D.  Electronically Signed     ACV/MEDQ  D:   05/01/2006  T:  05/01/2006  Job:  [id number]   cc:   Gretta Arab. Valentina Lucks, M.D.  Fax: [phone]

## 2011-04-28 NOTE — Cardiovascular Report (Signed)
[name], [name]             ACCOUNT NO.:  [id number]   MEDICAL RECORD NO.:  [id number]          PATIENT TYPE:  OBV   LOCATION:  2852                         FACILITY:  [facility]   PHYSICIAN:  [name], M.D.DATE OF BIRTH:  May 31, 1946   DATE OF PROCEDURE:  05/01/2006  DATE OF DISCHARGE:                              CARDIAC CATHETERIZATION   PROCEDURES PERFORMED:  1.  Selective coronary angiography with Judkins technique.  2.  Retrograde left heart catheterization.  3.  Left ventricular angiography.  4.  Right percutaneous femoral artery AngioSeal closure.   COMPLICATIONS:  None.   ENTRY SITE:  Right femoral.   DYE USED:  Omnipaque.   PATIENT PROFILE:  The patient is a 65 year old obese, hypertensive white  female with a family history of coronary disease who presented to the  emergency room with chest pain.  She was admitted to the service of Dr.  Kela Millin.  We were asked to see the patient in consultation.  The  patient had some features of her chest pain that were somewhat suggestive of  coronary disease, as well as being compatible with GI dysfunction because of  a lot burping and belching.  We evaluated the patient, and, after informed  consent with the patient and her husband including the risks of death, MI,  stroke, bleeding and infection, we came to the cath lab and proceeded with  diagnostic cardiac catheterization.  No complications occurred.   RESULTS:   PRESSURES:  Left ventricular pressure after labetalol was given for a blood  pressure of 190/100 was then noted to be 147/14, end-diastolic pressure of  26, central aortic pressure 150/84, mean of 110.  No aortic valve gradient  by pullback technique.   ANGIOGRAPHIC RESULTS:  The patient's coronary arteries were entirely normal.  The left main was normal in length and in overall size.  The LAD coursed to  the cardiac apex.  It gave rise to three diagonal branches which were medium  in size  none.  None were diseased.  The circumflex was nondominant, giving  rise to one somewhat tortuous obtuse marginal branch.  No lesions seen.  RCA  was normal with a bifurcating posterolateral branch and a medium-sized  posterior descending branch, all normal.   After I then did a right femoral angiogram with hand injection, I proceeded  to perform a successful closure of the right femoral artery.  No  complications occurred.  The patient tolerated the procedure well.   FINAL DIAGNOSES:  1.  Angiographically patent coronary arteries.  2.  Normal left ventricular systolic function.  3.  Successful right femoral AngioSeal.  4.  Chest pain with mixed features of gastrointestinal and cardiac suspected      etiology.   PLAN:  The patient will be a candidate for discharge in 2 hours because of  her successful AngioSeal closure.           ______________________________  [name], M.D.     WHG/MEDQ  D:  05/01/2006  T:  05/02/2006  Job:  [id number]   cc:   Kela Millin, M.D.  [facility]   [facility]

## 2011-06-23 ENCOUNTER — Other Ambulatory Visit: Payer: Self-pay | Admitting: Family Medicine

## 2011-06-23 DIAGNOSIS — Z1231 Encounter for screening mammogram for malignant neoplasm of breast: Secondary | ICD-10-CM

## 2011-07-27 ENCOUNTER — Ambulatory Visit
Admission: RE | Admit: 2011-07-27 | Discharge: 2011-07-27 | Disposition: A | Discharge status: Home / Self Care | Payer: Federal, State, Local not specified - PPO | Source: Ambulatory Visit | Attending: Family Medicine | Admitting: Family Medicine

## 2011-07-27 DIAGNOSIS — Z1231 Encounter for screening mammogram for malignant neoplasm of breast: Secondary | ICD-10-CM

## 2011-09-28 LAB — COMPREHENSIVE METABOLIC PANEL
AST: 21
Alkaline Phosphatase: 67
BUN: 10
CO2: 26
Chloride: 107
Creatinine, Ser: 0.66
GFR calc Af Amer: >60
GFR calc non Af Amer: >60
Potassium: 3.7
Total Bilirubin: 1.1

## 2011-09-28 LAB — CBC
HCT: 41.5
MCV: 85.3
RBC: 4.86
WBC: 9.5

## 2011-09-28 LAB — DIFFERENTIAL
Basophils Absolute: 0.1
Basophils Relative: 1
Eosinophils Absolute: 0.3
Eosinophils Relative: 3
Lymphocytes Relative: 23
Monocytes Absolute: 0.5

## 2011-09-28 LAB — POCT CARDIAC MARKERS
Operator id: 208821
Troponin i, poc: <0.05

## 2012-02-12 DIAGNOSIS — H40019 Open angle with borderline findings, low risk, unspecified eye: Secondary | ICD-10-CM | POA: Diagnosis not present

## 2012-02-12 DIAGNOSIS — H2510 Age-related nuclear cataract, unspecified eye: Secondary | ICD-10-CM | POA: Diagnosis not present

## 2012-03-20 ENCOUNTER — Encounter: Payer: Self-pay | Admitting: Internal Medicine

## 2012-04-04 DIAGNOSIS — E785 Hyperlipidemia, unspecified: Secondary | ICD-10-CM | POA: Diagnosis not present

## 2012-04-04 DIAGNOSIS — I10 Essential (primary) hypertension: Secondary | ICD-10-CM | POA: Diagnosis not present

## 2012-04-08 DIAGNOSIS — I10 Essential (primary) hypertension: Secondary | ICD-10-CM | POA: Diagnosis not present

## 2012-04-08 DIAGNOSIS — Z23 Encounter for immunization: Secondary | ICD-10-CM | POA: Diagnosis not present

## 2012-04-08 DIAGNOSIS — E669 Obesity, unspecified: Secondary | ICD-10-CM | POA: Diagnosis not present

## 2012-04-08 DIAGNOSIS — B351 Tinea unguium: Secondary | ICD-10-CM | POA: Diagnosis not present

## 2012-04-08 DIAGNOSIS — E7800 Pure hypercholesterolemia, unspecified: Secondary | ICD-10-CM | POA: Diagnosis not present

## 2012-06-26 ENCOUNTER — Other Ambulatory Visit: Payer: Self-pay | Admitting: Family Medicine

## 2012-06-26 DIAGNOSIS — Z1231 Encounter for screening mammogram for malignant neoplasm of breast: Secondary | ICD-10-CM

## 2012-07-30 ENCOUNTER — Ambulatory Visit
Admission: RE | Admit: 2012-07-30 | Discharge: 2012-07-30 | Disposition: A | Discharge status: Home / Self Care | Payer: Medicare Other | Source: Ambulatory Visit | Attending: Family Medicine | Admitting: Family Medicine

## 2012-07-30 ENCOUNTER — Ambulatory Visit: Payer: Federal, State, Local not specified - PPO

## 2012-07-30 DIAGNOSIS — Z1231 Encounter for screening mammogram for malignant neoplasm of breast: Secondary | ICD-10-CM | POA: Diagnosis not present

## 2012-10-17 DIAGNOSIS — H6120 Impacted cerumen, unspecified ear: Secondary | ICD-10-CM | POA: Diagnosis not present

## 2012-10-17 DIAGNOSIS — K219 Gastro-esophageal reflux disease without esophagitis: Secondary | ICD-10-CM | POA: Diagnosis not present

## 2012-10-17 DIAGNOSIS — F411 Generalized anxiety disorder: Secondary | ICD-10-CM | POA: Diagnosis not present

## 2012-10-17 DIAGNOSIS — E669 Obesity, unspecified: Secondary | ICD-10-CM | POA: Diagnosis not present

## 2012-10-17 DIAGNOSIS — E7800 Pure hypercholesterolemia, unspecified: Secondary | ICD-10-CM | POA: Diagnosis not present

## 2012-10-17 DIAGNOSIS — Z0000 Encounter for general adult medical examination without abnormal findings: Secondary | ICD-10-CM | POA: Diagnosis not present

## 2012-10-17 DIAGNOSIS — I10 Essential (primary) hypertension: Secondary | ICD-10-CM | POA: Diagnosis not present

## 2012-10-17 DIAGNOSIS — Z23 Encounter for immunization: Secondary | ICD-10-CM | POA: Diagnosis not present

## 2012-10-22 DIAGNOSIS — I10 Essential (primary) hypertension: Secondary | ICD-10-CM | POA: Diagnosis not present

## 2012-10-22 DIAGNOSIS — E782 Mixed hyperlipidemia: Secondary | ICD-10-CM | POA: Diagnosis not present

## 2012-12-31 DIAGNOSIS — M899 Disorder of bone, unspecified: Secondary | ICD-10-CM | POA: Diagnosis not present

## 2012-12-31 DIAGNOSIS — M949 Disorder of cartilage, unspecified: Secondary | ICD-10-CM | POA: Diagnosis not present

## 2013-01-17 DIAGNOSIS — J019 Acute sinusitis, unspecified: Secondary | ICD-10-CM | POA: Diagnosis not present

## 2013-03-06 DIAGNOSIS — H2510 Age-related nuclear cataract, unspecified eye: Secondary | ICD-10-CM | POA: Diagnosis not present

## 2013-03-06 DIAGNOSIS — H40019 Open angle with borderline findings, low risk, unspecified eye: Secondary | ICD-10-CM | POA: Diagnosis not present

## 2013-04-15 DIAGNOSIS — I10 Essential (primary) hypertension: Secondary | ICD-10-CM | POA: Diagnosis not present

## 2013-04-15 DIAGNOSIS — E669 Obesity, unspecified: Secondary | ICD-10-CM | POA: Diagnosis not present

## 2013-04-15 DIAGNOSIS — K219 Gastro-esophageal reflux disease without esophagitis: Secondary | ICD-10-CM | POA: Diagnosis not present

## 2013-04-15 DIAGNOSIS — B351 Tinea unguium: Secondary | ICD-10-CM | POA: Diagnosis not present

## 2013-04-15 DIAGNOSIS — E7800 Pure hypercholesterolemia, unspecified: Secondary | ICD-10-CM | POA: Diagnosis not present

## 2013-04-15 DIAGNOSIS — F411 Generalized anxiety disorder: Secondary | ICD-10-CM | POA: Diagnosis not present

## 2013-05-20 DIAGNOSIS — R609 Edema, unspecified: Secondary | ICD-10-CM | POA: Diagnosis not present

## 2013-05-20 DIAGNOSIS — Z8349 Family history of other endocrine, nutritional and metabolic diseases: Secondary | ICD-10-CM | POA: Diagnosis not present

## 2013-05-20 DIAGNOSIS — E669 Obesity, unspecified: Secondary | ICD-10-CM | POA: Diagnosis not present

## 2013-05-20 DIAGNOSIS — I10 Essential (primary) hypertension: Secondary | ICD-10-CM | POA: Diagnosis not present

## 2013-05-20 DIAGNOSIS — R5381 Other malaise: Secondary | ICD-10-CM | POA: Diagnosis not present

## 2013-05-22 ENCOUNTER — Encounter: Payer: Self-pay | Admitting: Cardiology

## 2013-05-22 DIAGNOSIS — E785 Hyperlipidemia, unspecified: Secondary | ICD-10-CM

## 2013-05-22 DIAGNOSIS — I10 Essential (primary) hypertension: Secondary | ICD-10-CM

## 2013-05-23 ENCOUNTER — Encounter: Payer: Self-pay | Admitting: Cardiovascular Disease

## 2013-05-23 ENCOUNTER — Ambulatory Visit (INDEPENDENT_AMBULATORY_CARE_PROVIDER_SITE_OTHER): Payer: Medicare Other | Admitting: Cardiovascular Disease

## 2013-05-23 VITALS — BP 124/86 | HR 78 | Resp 16 | Ht 66.0 in | Wt 261.1 lb

## 2013-05-23 DIAGNOSIS — I10 Essential (primary) hypertension: Secondary | ICD-10-CM

## 2013-05-23 DIAGNOSIS — E785 Hyperlipidemia, unspecified: Secondary | ICD-10-CM | POA: Diagnosis not present

## 2013-05-23 NOTE — Patient Instructions (Signed)
Your physician recommends that you schedule a follow-up appointment in: 1 year. Keep up the hard work with trying to lose weight: don't be discouraged, your efforts will pay off with health benefits.

## 2013-05-24 ENCOUNTER — Encounter: Payer: Self-pay | Admitting: Cardiovascular Disease

## 2013-05-24 NOTE — Progress Notes (Signed)
[name], female   DOB: 1946-03-12, 67 y.o.   MRN: [id number]      Reason for office visit Followup for coronary risk factors.  [name] is a morbidly obese 67 year old woman whose husband [name] is also our patient. Chest strong family history of coronary disease and a personal history of hypertension and hyperlipidemia and borderline diabetes mellitus. She has a previous stress test performed in 2008 that did not show signs of coronary disease and underwent cardiac catheterization in 2007 and had angiographically patent coronary arteries without meaningful atherosclerosis.  From a cardiac standpoint there have been no new problems since her last visit. Laboratory tests performed by Dr. Doristine Locks recently showed generally satisfactory lipid profile values, considering the fact that she has been intolerant to statin medications and fibroids do to severe myalgia. Her total course was 186 HDL 39 LDL 108 triglycerides 242. Her fasting glucose is 97 and her liver function tests and renal function tests were normal.  [name] is very frustrated and discouraged about her inability to lose weight. She says this has been a lifelong struggle and clearly has deeply rooted unpleasant feelings related to her body image. She cried for a bit in the office today. She says that exercising hasn't helped at all. She is frustrated that she eats a lot less and her husband who is much leaner than she is.  She is seeing Dr. Talmage Coin and is hoping that they will be an endocrinological explanation for her obesity that he can help treat    Allergies  Allergen Reactions  . Penicillins Rash    Current Outpatient Prescriptions  Medication Sig Dispense Refill  . amLODipine (NORVASC) 10 MG tablet Take 10 mg by mouth daily.      Marland Kitchen aspirin EC 81 MG tablet Take 81 mg by mouth daily.      . cyanocobalamin 500 MCG tablet Take 500 mcg by mouth daily.      Marland Kitchen esomeprazole (NEXIUM) 40 MG capsule Take  40 mg by mouth 2 (two) times daily.      . Lecith-Inosi-Chol-B12-Liver (LIVERITE PO) Take 2 tablets by mouth at bedtime.      . metoprolol tartrate (LOPRESSOR) 25 MG tablet Take 12.5 mg by mouth 2 (two) times daily.      . Multiple Minerals-Vitamins (CALCIUM-MAGNESIUM-ZINC-D3) TABS Take 1 tablet by mouth 2 (two) times daily.      . Omega-3 Fatty Acids (FISH OIL) 1000 MG CAPS Take 2 capsules by mouth at bedtime.      . quinapril (ACCUPRIL) 40 MG tablet Take 40 mg by mouth at bedtime.      . Red Yeast Rice 600 MG CAPS Take 1 capsule by mouth daily.      Marland Kitchen terbinafine (LAMISIL) 250 MG tablet Take 250 mg by mouth daily.      . Turmeric Curcumin 500 MG CAPS Take 1 capsule by mouth daily.       No current facility-administered medications for this visit.    Past Medical History  Diagnosis Date  . HTN (hypertension)   . Hyperlipidemia   . Obesity   . Normal cardiac stress test 2011    echo stress with normal LV function., Normal 2-D echo 2008    Past Surgical History  Procedure Laterality Date  . Appendectomy  1957  . Tonsilectomy/adenoidectomy with myringotomy  1963  . Partial hysterectomy  2001  . Cardiac catheterization  2007    Patent coronary arteries    Family  History  Problem Relation Age of Onset  . Heart disease Mother 37  . Hypertension Mother   . Hyperlipidemia Mother   . Cancer Father   . Heart failure Father   . Sudden death Maternal Grandfather 87  . Sudden death Paternal Grandfather 34    History   Social History  . Marital Status: Married    Spouse Name: N/A    Number of Children: N/A  . Years of Education: N/A   Occupational History  . Not on file.   Social History Main Topics  . Smoking status: Never Smoker   . Smokeless tobacco: Not on file  . Alcohol Use: Not on file  . Drug Use: Not on file  . Sexually Active: Not on file   Other Topics Concern  . Not on file   Social History Narrative  . No narrative on file    Review of systems: The  patient specifically denies any chest pain at rest or with exertion, dyspnea at rest or with exertion, orthopnea, paroxysmal nocturnal dyspnea, syncope, palpitations, focal neurological deficits, intermittent claudication, lower extremity edema, unexplained weight gain, cough, hemoptysis or wheezing.  The patient also denies abdominal pain, nausea, vomiting, dysphagia, diarrhea, constipation, polyuria, polydipsia, dysuria, hematuria, frequency, urgency, abnormal bleeding or bruising, fever, chills, unexpected weight changes, mood swings, change in skin or hair texture, change in voice quality, auditory or visual problems, allergic reactions or rashes, new musculoskeletal complaints other than usual "aches and pains".   PHYSICAL EXAM BP 124/86  Pulse 78  Resp 16  Ht 5\' 6"  (1.676 m)  Wt 261 lb 1.6 oz (118.434 kg)  BMI 42.16 kg/m2  General: Alert, oriented x3, no distress Head: no evidence of trauma, PERRL, EOMI, no exophtalmos or lid lag, no myxedema, no xanthelasma; normal ears, nose and oropharynx Neck: normal jugular venous pulsations and no hepatojugular reflux; brisk carotid pulses without delay and no carotid bruits Chest: clear to auscultation, no signs of consolidation by percussion or palpation, normal fremitus, symmetrical and full respiratory excursions Cardiovascular: normal position and quality of the apical impulse, regular rhythm, normal first and second heart sounds, no murmurs, rubs or gallops Abdomen: no tenderness or distention, no masses by palpation, no abnormal pulsatility or arterial bruits, normal bowel sounds, no hepatosplenomegaly Extremities: no clubbing, cyanosis or edema; 2+ radial, ulnar and brachial pulses bilaterally; 2+ right femoral, posterior tibial and dorsalis pedis pulses; 2+ left femoral, posterior tibial and dorsalis pedis pulses; no subclavian or femoral bruits Neurological: grossly nonfocal   EKG: Normal sinus rhythm, normal tracing    ASSESSMENT AND  PLAN HYPERTENSION Good control  Hyperlipidemia I tried to encourage [name], telling her that her efforts at diet and exercise are likely beneficial to her health, even if they are not associated with visible weight loss. I've encouraged her to keep on exercising and to continue watching her intake of sweets, high glycemic index arches and saturated fats and concentrated on a diet rich in protein and on saturated fat. Since she does not have established coronary disease and her diabetes is kept at bay with diet only I do not think aggressive pharmacological treatment is necessary for her mild dyslipidemia. It would be used to bring her triglycerides less than 150 but I do not think that the side effects of medication can be justified for her mild abnormality. A short note continue the same medication      Meds ordered this encounter  Medications  . aspirin EC 81 MG tablet  Sig: Take 81 mg by mouth daily.  . cyanocobalamin 500 MCG tablet    Sig: Take 500 mcg by mouth daily.  . Turmeric Curcumin 500 MG CAPS    Sig: Take 1 capsule by mouth daily.  Marland Kitchen terbinafine (LAMISIL) 250 MG tablet    Sig: Take 250 mg by mouth daily.  . Red Yeast Rice 600 MG CAPS    Sig: Take 1 capsule by mouth daily.  . Lecith-Inosi-Chol-B12-Liver (LIVERITE PO)    Sig: Take 2 tablets by mouth at bedtime.  . Multiple Minerals-Vitamins (CALCIUM-MAGNESIUM-ZINC-D3) TABS    Sig: Take 1 tablet by mouth 2 (two) times daily.  . Omega-3 Fatty Acids (FISH OIL) 1000 MG CAPS    Sig: Take 2 capsules by mouth at bedtime.    Junious Silk, MD, [facility] [phone] office [phone] pager  \

## 2013-05-24 NOTE — Assessment & Plan Note (Signed)
I tried to encourage [name], telling her that her efforts at diet and exercise are likely beneficial to her health, even if they are not associated with visible weight loss. I've encouraged her to keep on exercising and to continue watching her intake of sweets, high glycemic index arches and saturated fats and concentrated on a diet rich in protein and on saturated fat. Since she does not have established coronary disease and her diabetes is kept at bay with diet only I do not think aggressive pharmacological treatment is necessary for her mild dyslipidemia. It would be used to bring her triglycerides less than 150 but I do not think that the side effects of medication can be justified for her mild abnormality. A short note continue the same medication

## 2013-05-24 NOTE — Assessment & Plan Note (Signed)
Good control

## 2013-05-26 ENCOUNTER — Encounter: Payer: Self-pay | Admitting: Cardiovascular Disease

## 2013-05-28 DIAGNOSIS — Z8349 Family history of other endocrine, nutritional and metabolic diseases: Secondary | ICD-10-CM | POA: Diagnosis not present

## 2013-05-28 DIAGNOSIS — I10 Essential (primary) hypertension: Secondary | ICD-10-CM | POA: Diagnosis not present

## 2013-05-28 DIAGNOSIS — R609 Edema, unspecified: Secondary | ICD-10-CM | POA: Diagnosis not present

## 2013-05-28 DIAGNOSIS — R5381 Other malaise: Secondary | ICD-10-CM | POA: Diagnosis not present

## 2013-05-28 DIAGNOSIS — E669 Obesity, unspecified: Secondary | ICD-10-CM | POA: Diagnosis not present

## 2013-06-27 DIAGNOSIS — Z8349 Family history of other endocrine, nutritional and metabolic diseases: Secondary | ICD-10-CM | POA: Diagnosis not present

## 2013-06-27 DIAGNOSIS — Z713 Dietary counseling and surveillance: Secondary | ICD-10-CM | POA: Diagnosis not present

## 2013-06-27 DIAGNOSIS — E559 Vitamin D deficiency, unspecified: Secondary | ICD-10-CM | POA: Diagnosis not present

## 2013-06-30 ENCOUNTER — Other Ambulatory Visit: Payer: Self-pay

## 2013-06-30 DIAGNOSIS — Z1231 Encounter for screening mammogram for malignant neoplasm of breast: Secondary | ICD-10-CM

## 2013-07-11 DIAGNOSIS — C4490 Unspecified malignant neoplasm of skin, unspecified: Secondary | ICD-10-CM

## 2013-07-11 HISTORY — DX: Unspecified malignant neoplasm of skin, unspecified: C44.90

## 2013-07-31 ENCOUNTER — Ambulatory Visit
Admission: RE | Admit: 2013-07-31 | Discharge: 2013-07-31 | Disposition: A | Discharge status: Home / Self Care | Payer: Medicare Other | Source: Ambulatory Visit

## 2013-07-31 DIAGNOSIS — Z1231 Encounter for screening mammogram for malignant neoplasm of breast: Secondary | ICD-10-CM | POA: Diagnosis not present

## 2013-08-14 ENCOUNTER — Other Ambulatory Visit: Payer: Self-pay | Admitting: Dermatology

## 2013-08-14 DIAGNOSIS — L819 Disorder of pigmentation, unspecified: Secondary | ICD-10-CM | POA: Diagnosis not present

## 2013-08-14 DIAGNOSIS — W57XXXA Bitten or stung by nonvenomous insect and other nonvenomous arthropods, initial encounter: Secondary | ICD-10-CM | POA: Diagnosis not present

## 2013-08-14 DIAGNOSIS — L723 Sebaceous cyst: Secondary | ICD-10-CM | POA: Diagnosis not present

## 2013-08-14 DIAGNOSIS — C44319 Basal cell carcinoma of skin of other parts of face: Secondary | ICD-10-CM | POA: Diagnosis not present

## 2013-08-14 DIAGNOSIS — D239 Other benign neoplasm of skin, unspecified: Secondary | ICD-10-CM | POA: Diagnosis not present

## 2013-08-14 DIAGNOSIS — D485 Neoplasm of uncertain behavior of skin: Secondary | ICD-10-CM | POA: Diagnosis not present

## 2013-08-14 DIAGNOSIS — T148 Other injury of unspecified body region: Secondary | ICD-10-CM | POA: Diagnosis not present

## 2013-08-14 DIAGNOSIS — D1801 Hemangioma of skin and subcutaneous tissue: Secondary | ICD-10-CM | POA: Diagnosis not present

## 2013-08-14 DIAGNOSIS — L909 Atrophic disorder of skin, unspecified: Secondary | ICD-10-CM | POA: Diagnosis not present

## 2013-09-03 DIAGNOSIS — Z85828 Personal history of other malignant neoplasm of skin: Secondary | ICD-10-CM | POA: Diagnosis not present

## 2013-09-03 DIAGNOSIS — C44319 Basal cell carcinoma of skin of other parts of face: Secondary | ICD-10-CM | POA: Diagnosis not present

## 2013-10-20 DIAGNOSIS — E559 Vitamin D deficiency, unspecified: Secondary | ICD-10-CM | POA: Diagnosis not present

## 2013-10-20 DIAGNOSIS — Z0000 Encounter for general adult medical examination without abnormal findings: Secondary | ICD-10-CM | POA: Diagnosis not present

## 2013-10-20 DIAGNOSIS — I10 Essential (primary) hypertension: Secondary | ICD-10-CM | POA: Diagnosis not present

## 2013-10-20 DIAGNOSIS — C4491 Basal cell carcinoma of skin, unspecified: Secondary | ICD-10-CM | POA: Diagnosis not present

## 2013-10-20 DIAGNOSIS — Z23 Encounter for immunization: Secondary | ICD-10-CM | POA: Diagnosis not present

## 2013-10-20 DIAGNOSIS — K219 Gastro-esophageal reflux disease without esophagitis: Secondary | ICD-10-CM | POA: Diagnosis not present

## 2013-10-20 DIAGNOSIS — E7800 Pure hypercholesterolemia, unspecified: Secondary | ICD-10-CM | POA: Diagnosis not present

## 2013-10-20 DIAGNOSIS — F411 Generalized anxiety disorder: Secondary | ICD-10-CM | POA: Diagnosis not present

## 2014-01-15 ENCOUNTER — Encounter: Payer: Self-pay | Admitting: Internal Medicine

## 2014-01-21 ENCOUNTER — Encounter: Payer: Self-pay | Admitting: Internal Medicine

## 2014-03-04 ENCOUNTER — Ambulatory Visit (AMBULATORY_SURGERY_CENTER): Payer: Self-pay | Admitting: *Deleted

## 2014-03-04 VITALS — Ht 66.0 in | Wt 231.0 lb

## 2014-03-04 DIAGNOSIS — Z800 Family history of malignant neoplasm of digestive organs: Secondary | ICD-10-CM

## 2014-03-04 MED ORDER — MOVIPREP 100 G PO SOLR: ORAL | Status: DC

## 2014-03-04 NOTE — Progress Notes (Signed)
No allergies to eggs or soy. No problems with anesthesia.  

## 2014-03-09 DIAGNOSIS — H40019 Open angle with borderline findings, low risk, unspecified eye: Secondary | ICD-10-CM | POA: Diagnosis not present

## 2014-03-18 ENCOUNTER — Encounter: Payer: Self-pay | Admitting: Internal Medicine

## 2014-03-18 ENCOUNTER — Ambulatory Visit (AMBULATORY_SURGERY_CENTER): Payer: Medicare Other | Admitting: Internal Medicine

## 2014-03-18 VITALS — BP 109/62 | HR 60 | Temp 96.4°F | Resp 20 | Ht 66.0 in | Wt 231.0 lb

## 2014-03-18 DIAGNOSIS — Z1211 Encounter for screening for malignant neoplasm of colon: Secondary | ICD-10-CM

## 2014-03-18 DIAGNOSIS — F411 Generalized anxiety disorder: Secondary | ICD-10-CM | POA: Diagnosis not present

## 2014-03-18 DIAGNOSIS — Z800 Family history of malignant neoplasm of digestive organs: Secondary | ICD-10-CM | POA: Diagnosis not present

## 2014-03-18 MED ORDER — SODIUM CHLORIDE 0.9 % IV SOLN: 500.0000 mL | INTRAVENOUS | Status: DC

## 2014-03-18 NOTE — Progress Notes (Signed)
A/ox3 pleased with MAC, report to April RN 

## 2014-03-18 NOTE — Patient Instructions (Signed)
YOU HAD AN ENDOSCOPIC PROCEDURE TODAY AT THE [facility]: Refer to the procedure report that was given to you for any specific questions about what was found during the examination.  If the procedure report does not answer your questions, please call your gastroenterologist to clarify.  If you requested that your care partner not be given the details of your procedure findings, then the procedure report has been included in a sealed envelope for you to review at your convenience later.  YOU SHOULD EXPECT: Some feelings of bloating in the abdomen. Passage of more gas than usual.  Walking can help get rid of the air that was put into your GI tract during the procedure and reduce the bloating. If you had a lower endoscopy (such as a colonoscopy or flexible sigmoidoscopy) you may notice spotting of blood in your stool or on the toilet paper. If you underwent a bowel prep for your procedure, then you may not have a normal bowel movement for a few days.  DIET: Your first meal following the procedure should be a light meal and then it is ok to progress to your normal diet.  A half-sandwich or bowl of soup is an example of a good first meal.  Heavy or fried foods are harder to digest and may make you feel nauseous or bloated.  Likewise meals heavy in dairy and vegetables can cause extra gas to form and this can also increase the bloating.  Drink plenty of fluids but you should avoid alcoholic beverages for 24 hours.  ACTIVITY: Your care partner should take you home directly after the procedure.  You should plan to take it easy, moving slowly for the rest of the day.  You can resume normal activity the day after the procedure however you should NOT DRIVE or use heavy machinery for 24 hours (because of the sedation medicines used during the test).    SYMPTOMS TO REPORT IMMEDIATELY: A gastroenterologist can be reached at any hour.  During normal business hours, 8:30 AM to 5:00 PM Monday through Friday,  call [phone].  After hours and on weekends, please call the GI answering service at [phone] who will take a message and have the physician on call contact you.   Following lower endoscopy (colonoscopy or flexible sigmoidoscopy):  Excessive amounts of blood in the stool  Significant tenderness or worsening of abdominal pains  Swelling of the abdomen that is new, acute  Fever of 100F or higher  FOLLOW UP: If any biopsies were taken you will be contacted by phone or by letter within the next 1-3 weeks.  Call your gastroenterologist if you have not heard about the biopsies in 3 weeks.  Our staff will call the home number listed on your records the next business day following your procedure to check on you and address any questions or concerns that you may have at that time regarding the information given to you following your procedure. This is a courtesy call and so if there is no answer at the home number and we have not heard from you through the emergency physician on call, we will assume that you have returned to your regular daily activities without incident.  SIGNATURES/CONFIDENTIALITY: You and/or your care partner have signed paperwork which will be entered into your electronic medical record.  These signatures attest to the fact that that the information above on your After Visit Summary has been reviewed and is understood.  Full responsibility of the confidentiality of this   this discharge information lies with you and/or your care-partner.  Diverticulosis, high fiber diet-handouts given   

## 2014-03-18 NOTE — Op Note (Signed)
[place]  [address] [place], [id number]   COLONOSCOPY PROCEDURE REPORT  PATIENT: [name], [name]  MR#: [id number] BIRTHDATE: 22-Apr-1946 , 67  yrs. old GENDER: Female ENDOSCOPIST: Lafayette Dragon, MD REFERRED AO:[id number] Laurann Montana, M.D. PROCEDURE DATE:  03/18/2014 PROCEDURE:   Colonoscopy, screening First Screening Colonoscopy - Avg.  risk and is 50 yrs.  old or older - No.  Prior Negative Screening - Now for repeat screening. 10 or more years since last screening Prior Negative Screening - Now for repeat screening.  Above average risk  History of Adenoma - Now for follow-up colonoscopy & has been > or = to 3 yrs.  N/A Polyps Removed Today? No.  Recommend repeat exam, <10 yrs? Yes. High risk (family or personal hx). ASA CLASS:   Class II INDICATIONS:parent with colon cancer.  Last colonoscopy 2003 and 2008. MEDICATIONS: MAC sedation, administered by CRNA and propofol (Diprivan) 200mg  IV  DESCRIPTION OF PROCEDURE:   After the risks benefits and alternatives of the procedure were thoroughly explained, informed consent was obtained.  A digital rectal exam revealed no abnormalities of the rectum.   The LB [id number]  endoscope was introduced through the anus and advanced to the cecum, which was identified by both the appendix and ileocecal valve. No adverse events experienced.   The quality of the prep was good, using MoviPrep  The instrument was then slowly withdrawn as the colon was fully examined.      COLON FINDINGS: Mild diverticulosis was noted throughout the entire examined colon.  Retroflexed views revealed no abnormalities. The time to cecum=4 minutes 41 seconds.  Withdrawal time=7 minutes 08 seconds.  The scope was withdrawn and the procedure completed. COMPLICATIONS: There were no complications.  ENDOSCOPIC IMPRESSION: Mild diverticulosis was noted throughout the entire examined colon  RECOMMENDATIONS: high fiber diet Recall  colonoscopy in 5 years   eSigned:  Lafayette Dragon, MD 03/18/2014 2:36 PM   cc:   PATIENT NAME:  [name], [name] MR#: [id number]

## 2014-03-19 ENCOUNTER — Telehealth: Payer: Self-pay | Admitting: *Deleted

## 2014-03-19 NOTE — Telephone Encounter (Signed)
  Follow up Call-  Call back number 03/18/2014  Post procedure Call Back phone  # cell [phone]  Permission to leave phone message No     Patient questions:  Do you have a fever, pain , or abdominal swelling? no Pain Score  0 *  Have you tolerated food without any problems? yes  Have you been able to return to your normal activities? yes  Do you have any questions about your discharge instructions: Diet   no Medications  no Follow up visit  no  Do you have questions or concerns about your Care? no  Actions: * If pain score is 4 or above: No action needed, pain <4. Patient stating she is a little dizzy this am. Patient eating and drinking fine, no other complaints. Encouraged patient to drink plenty of fluids.Marland Kitchen

## 2014-04-20 DIAGNOSIS — I10 Essential (primary) hypertension: Secondary | ICD-10-CM | POA: Diagnosis not present

## 2014-04-20 DIAGNOSIS — E7800 Pure hypercholesterolemia, unspecified: Secondary | ICD-10-CM | POA: Diagnosis not present

## 2014-04-27 ENCOUNTER — Ambulatory Visit: Payer: Medicare Other | Admitting: Cardiovascular Disease

## 2014-05-19 ENCOUNTER — Ambulatory Visit: Payer: Medicare Other | Admitting: Cardiovascular Disease

## 2014-05-28 ENCOUNTER — Encounter: Payer: Self-pay | Admitting: Cardiovascular Disease

## 2014-06-29 ENCOUNTER — Other Ambulatory Visit: Payer: Self-pay

## 2014-06-29 DIAGNOSIS — Z1231 Encounter for screening mammogram for malignant neoplasm of breast: Secondary | ICD-10-CM

## 2014-06-30 ENCOUNTER — Ambulatory Visit: Payer: Medicare Other | Admitting: Cardiovascular Disease

## 2014-07-10 ENCOUNTER — Ambulatory Visit (INDEPENDENT_AMBULATORY_CARE_PROVIDER_SITE_OTHER): Payer: Medicare Other | Admitting: Cardiovascular Disease

## 2014-07-10 ENCOUNTER — Encounter: Payer: Self-pay | Admitting: Cardiovascular Disease

## 2014-07-10 VITALS — BP 118/76 | HR 73 | Resp 16 | Ht 67.0 in | Wt 230.7 lb

## 2014-07-10 DIAGNOSIS — I10 Essential (primary) hypertension: Secondary | ICD-10-CM | POA: Diagnosis not present

## 2014-07-10 DIAGNOSIS — E785 Hyperlipidemia, unspecified: Secondary | ICD-10-CM | POA: Diagnosis not present

## 2014-07-10 NOTE — Patient Instructions (Signed)
Your physician wants you to follow-up in: 1 year. You will receive a reminder letter in the mail two months in advance. If you don't receive a letter, please call our office to schedule the follow-up appointment.  

## 2014-07-12 ENCOUNTER — Encounter: Payer: Self-pay | Admitting: Cardiovascular Disease

## 2014-07-12 NOTE — Progress Notes (Signed)
[name], female   DOB: 11-24-1946, 68 y.o.   MRN: [id number]     Reason for office visit Hypertension, hyperlipidemia , lower extremity edema  [name] is a morbidly obese 68 year old woman, whose husband [name] is also our patient. She has a strong family history of coronary disease and a personal history of hypertension and hyperlipidemia and borderline diabetes mellitus. She has a previous stress test performed in 2008 that did not show signs of coronary disease and underwent cardiac catheterization in 2007 with angiographically patent coronary arteries without meaningful atherosclerosis. She has been intolerant to statin medications and fibrates due to severe myalgia.   Since her last appointment she seems a little more optimistic. She has joined [employer] again and has lost weight. She is unhappy with the slow rate of progress. She had 2 cancel her subscription [employer] since she could not afford both that and [employer]. Her lipid profile which was just performed in May shows a total cholesterol of 170, triglycerides 168, HDL 41, LDL 95. Her appointment with Dr. Buddy Duty did not show evidence of any new endocrinology problems and vitamin D deficiency.  Her only complaint today is intermittent puffiness in her feet.  Allergies  Allergen Reactions  . Statins     Legs swell; muscle aches  . Penicillins Rash    Current Outpatient Prescriptions  Medication Sig Dispense Refill  . amLODipine (NORVASC) 10 MG tablet Take 10 mg by mouth daily.      Marland Kitchen aspirin EC 81 MG tablet Take 81 mg by mouth daily.      Marland Kitchen b complex vitamins tablet Take 1 tablet by mouth daily.      . Cholecalciferol (VITAMIN D-3) 1000 UNITS CAPS Take 2,000 Units by mouth daily.      Marland Kitchen esomeprazole (NEXIUM) 40 MG capsule Take 40 mg by mouth daily as needed.       Marland Kitchen FLAXSEED, LINSEED, PO Take 1,000 mg by mouth daily.      . Lecith-Inosi-Chol-B12-Liver (LIVERITE PO) Take 2 tablets by mouth at  bedtime.      . metoprolol tartrate (LOPRESSOR) 25 MG tablet Take 6.25 mg by mouth 2 (two) times daily.       . Multiple Minerals-Vitamins (CALCIUM-MAGNESIUM-ZINC-D3) TABS Take 1 tablet by mouth 2 (two) times daily.      . Omega-3 Fatty Acids (FISH OIL) 1000 MG CAPS Take 1 capsule by mouth at bedtime.       . Probiotic Product (PROBIOTIC DAILY PO) Take by mouth daily.      . quinapril (ACCUPRIL) 40 MG tablet Take 40 mg by mouth at bedtime.      . Turmeric Curcumin 500 MG CAPS Take 1 capsule by mouth daily.      . vitamin C (ASCORBIC ACID) 500 MG tablet Take 500 mg by mouth daily.       No current facility-administered medications for this visit.    Past Medical History  Diagnosis Date  . HTN (hypertension)   . Hyperlipidemia   . Obesity   . Normal cardiac stress test 2011    echo stress with normal LV function., Normal 2-D echo 2008  . GERD (gastroesophageal reflux disease)   . 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 Appendectomy  1957  . Tonsilectomy/adenoidectomy with myringotomy  1963  . Partial hysterectomy  2001  . Cardiac catheterization  2007  Patent coronary arteries  . Laparoscopic cholecystectomy  2007  . Knee arthroscopy Right 1999    Family History  Problem Relation Age of Onset  . Heart disease Mother 70  . Hypertension Mother   . Hyperlipidemia Mother   . Cancer Father   . Heart failure Father   . Colon cancer Father 54  . Sudden death Maternal Grandfather 54  . Sudden death Paternal Grandfather 27  . Colon cancer Sister 72  . Esophageal cancer Neg Hx   . Rectal cancer Neg Hx   . Stomach cancer Neg Hx     History   Social History  . Marital Status: Married    Spouse Name: N/A    Number of Children: N/A  . Years of Education: N/A   Occupational History  . Not on file.   Social History Main Topics  . Smoking status: Never Smoker   . Smokeless tobacco: Never Used  . Alcohol Use: No  .  Drug Use: No  . Sexual Activity: Not on file   Other Topics Concern  . Not on file   Social History Narrative  . No narrative on file    Review of systems: The patient specifically denies any chest pain at rest or with exertion, dyspnea at rest or with exertion, orthopnea, paroxysmal nocturnal dyspnea, syncope, palpitations, focal neurological deficits, intermittent claudication, unexplained weight gain, cough, hemoptysis or wheezing.  The patient also denies abdominal pain, nausea, vomiting, dysphagia, diarrhea, constipation, polyuria, polydipsia, dysuria, hematuria, frequency, urgency, abnormal bleeding or bruising, fever, chills, unexpected weight changes, mood swings, change in skin or hair texture, change in voice quality, auditory or visual problems, allergic reactions or rashes, new musculoskeletal complaints other than usual "aches and pains".   PHYSICAL EXAM BP 118/76  Pulse 73  Resp 16  Ht 5\' 7"  (1.702 m)  Wt 230 lb 11.2 oz (104.645 kg)  BMI 36.12 kg/m2 General: Alert, oriented x3, no distress  Head: no evidence of trauma, PERRL, EOMI, no exophtalmos or lid lag, no myxedema, no xanthelasma; normal ears, nose and oropharynx  Neck: normal jugular venous pulsations and no hepatojugular reflux; brisk carotid pulses without delay and no carotid bruits  Chest: clear to auscultation, no signs of consolidation by percussion or palpation, normal fremitus, symmetrical and full respiratory excursions  Cardiovascular: normal position and quality of the apical impulse, regular rhythm, normal first and second heart sounds, no murmurs, rubs or gallops  Abdomen: no tenderness or distention, no masses by palpation, no abnormal pulsatility or arterial bruits, normal bowel sounds, no hepatosplenomegaly  Extremities: no clubbing, cyanosis or edema; 2+ radial, ulnar and brachial pulses bilaterally; 2+ right femoral, posterior tibial and dorsalis pedis pulses; 2+ left femoral, posterior tibial and  dorsalis pedis pulses; no subclavian or femoral bruits  Neurological: grossly nonfocal   EKG: Normal sinus rhythm, LVH by voltage in leads 1 and aVL, and reduced voltage in the precordial leads possibly do to obesity  Lipid Panel May 2015 total cholesterol of 170, triglycerides 168, HDL 41, LDL 95.    BMET Creatinine 0.87, potassium 4.5, glucose 96, normal LFTs   ASSESSMENT AND PLAN   Lipid profile findings are encouraging, with slight improvement in all her parameters since last year. She is maintaining normal, albeit borderline high glucose. She has managed to lose some weight. Her blood pressure is excellent. All in all although the changes are small, they are all in the right direction. Even if [facility]'t [facility] she should continue  trying to exercise even if this is only walking at a slow pace to her neighborhood.  Patient Instructions  Your physician wants you to follow-up in: 1 year. You will receive a reminder letter in the mail two months in advance. If you don't receive a letter, please call our office to schedule the follow-up appointment.     No orders of the defined types were placed in this encounter.   Meds ordered this encounter  Medications  . vitamin C (ASCORBIC ACID) 500 MG tablet    Sig: Take 500 mg by mouth daily.  Marland Kitchen b complex vitamins tablet    Sig: Take 1 tablet by mouth daily.  . Cholecalciferol (VITAMIN D-3) 1000 UNITS CAPS    Sig: Take 2,000 Units by mouth daily.    Holli Humbles, MD, [place] [phone] office [phone] pager

## 2014-08-03 ENCOUNTER — Ambulatory Visit
Admission: RE | Admit: 2014-08-03 | Discharge: 2014-08-03 | Disposition: A | Discharge status: Home / Self Care | Payer: Medicare Other | Source: Ambulatory Visit

## 2014-08-03 DIAGNOSIS — Z1231 Encounter for screening mammogram for malignant neoplasm of breast: Secondary | ICD-10-CM

## 2014-08-22 ENCOUNTER — Encounter: Payer: Self-pay | Admitting: Internal Medicine

## 2014-10-07 ENCOUNTER — Other Ambulatory Visit: Payer: Self-pay | Admitting: Dermatology

## 2014-10-07 DIAGNOSIS — D2372 Other benign neoplasm of skin of left lower limb, including hip: Secondary | ICD-10-CM | POA: Diagnosis not present

## 2014-10-07 DIAGNOSIS — Z85828 Personal history of other malignant neoplasm of skin: Secondary | ICD-10-CM | POA: Diagnosis not present

## 2014-10-07 DIAGNOSIS — D485 Neoplasm of uncertain behavior of skin: Secondary | ICD-10-CM | POA: Diagnosis not present

## 2014-10-07 DIAGNOSIS — L821 Other seborrheic keratosis: Secondary | ICD-10-CM | POA: Diagnosis not present

## 2014-10-07 DIAGNOSIS — D2272 Melanocytic nevi of left lower limb, including hip: Secondary | ICD-10-CM | POA: Diagnosis not present

## 2014-10-07 DIAGNOSIS — D225 Melanocytic nevi of trunk: Secondary | ICD-10-CM | POA: Diagnosis not present

## 2014-10-07 DIAGNOSIS — L820 Inflamed seborrheic keratosis: Secondary | ICD-10-CM | POA: Diagnosis not present

## 2014-10-07 DIAGNOSIS — D1801 Hemangioma of skin and subcutaneous tissue: Secondary | ICD-10-CM | POA: Diagnosis not present

## 2014-10-21 DIAGNOSIS — E559 Vitamin D deficiency, unspecified: Secondary | ICD-10-CM | POA: Diagnosis not present

## 2014-10-21 DIAGNOSIS — F39 Unspecified mood [affective] disorder: Secondary | ICD-10-CM | POA: Diagnosis not present

## 2014-10-21 DIAGNOSIS — Z6838 Body mass index (BMI) 38.0-38.9, adult: Secondary | ICD-10-CM | POA: Diagnosis not present

## 2014-10-21 DIAGNOSIS — K219 Gastro-esophageal reflux disease without esophagitis: Secondary | ICD-10-CM | POA: Diagnosis not present

## 2014-10-21 DIAGNOSIS — Z0000 Encounter for general adult medical examination without abnormal findings: Secondary | ICD-10-CM | POA: Diagnosis not present

## 2014-10-21 DIAGNOSIS — E669 Obesity, unspecified: Secondary | ICD-10-CM | POA: Diagnosis not present

## 2014-10-21 DIAGNOSIS — I10 Essential (primary) hypertension: Secondary | ICD-10-CM | POA: Diagnosis not present

## 2014-10-21 DIAGNOSIS — E780 Pure hypercholesterolemia: Secondary | ICD-10-CM | POA: Diagnosis not present

## 2014-10-21 DIAGNOSIS — Z1389 Encounter for screening for other disorder: Secondary | ICD-10-CM | POA: Diagnosis not present

## 2014-10-21 DIAGNOSIS — Z23 Encounter for immunization: Secondary | ICD-10-CM | POA: Diagnosis not present

## 2015-03-18 DIAGNOSIS — H40013 Open angle with borderline findings, low risk, bilateral: Secondary | ICD-10-CM | POA: Diagnosis not present

## 2015-03-18 DIAGNOSIS — H2513 Age-related nuclear cataract, bilateral: Secondary | ICD-10-CM | POA: Diagnosis not present

## 2015-04-22 DIAGNOSIS — N183 Chronic kidney disease, stage 3 (moderate): Secondary | ICD-10-CM | POA: Diagnosis not present

## 2015-04-22 DIAGNOSIS — E780 Pure hypercholesterolemia: Secondary | ICD-10-CM | POA: Diagnosis not present

## 2015-04-22 DIAGNOSIS — I129 Hypertensive chronic kidney disease with stage 1 through stage 4 chronic kidney disease, or unspecified chronic kidney disease: Secondary | ICD-10-CM | POA: Diagnosis not present

## 2015-04-22 DIAGNOSIS — M5430 Sciatica, unspecified side: Secondary | ICD-10-CM | POA: Diagnosis not present

## 2015-05-20 DIAGNOSIS — J45909 Unspecified asthma, uncomplicated: Secondary | ICD-10-CM | POA: Diagnosis not present

## 2015-06-18 ENCOUNTER — Ambulatory Visit (INDEPENDENT_AMBULATORY_CARE_PROVIDER_SITE_OTHER): Payer: Medicare Other | Admitting: Cardiovascular Disease

## 2015-06-18 ENCOUNTER — Encounter: Payer: Self-pay | Admitting: Cardiovascular Disease

## 2015-06-18 VITALS — BP 106/78 | HR 74 | Ht 66.0 in | Wt 233.0 lb

## 2015-06-18 DIAGNOSIS — I10 Essential (primary) hypertension: Secondary | ICD-10-CM | POA: Diagnosis not present

## 2015-06-18 DIAGNOSIS — E785 Hyperlipidemia, unspecified: Secondary | ICD-10-CM | POA: Diagnosis not present

## 2015-06-18 NOTE — Patient Instructions (Signed)
Medication Instructions:   STOP METOPROLOL    Dr. Sallyanne Kuster recommends that you schedule a follow-up appointment in: [place]

## 2015-06-19 NOTE — Progress Notes (Signed)
[name], female   DOB: 1946/07/09, 69 y.o.   MRN: [id number]      Cardiology Office Note   Date:  06/19/2015   ID:  [name], DOB 08/05/1946, MRN [id number]  PCP:  Osborne Casco, MD  Cardiologist:   Sanda Klein, MD   Chief Complaint  Patient presents with  . Annual Exam    Patient feels sob at time and has had swelling.      History of Present Illness: [name] is a 69 y.o. female who presents for HTN, hyperlipidemia. She has no cardiovascular complaints and as always is very frustrated with her inability to lose weight. She has given up on [employer] since she didn't make any progress. She has ankle swelling but no other cardiovascular complaints. Recently she has been seriously limited by bilateral sciatica pain. She has had a lot of respiratory infections this year and was diagnosed with "asthmatic bronchitis". She is using bronchodilator inhalers  occasionally.  husband, [name], is also a patient. She has a strong family history of coronary disease and a personal history of hypertension and hyperlipidemia and borderline diabetes mellitus. She has a previous stress test performed in 2008 that did not show signs of coronary disease and underwent cardiac catheterization in 2007 with angiographically patent coronary arteries without meaningful atherosclerosis. She has been intolerant to statin medications and fibrates due to severe myalgia.    Past Medical History  Diagnosis Date  . HTN (hypertension)   . Hyperlipidemia   . Obesity   . Normal cardiac stress test 2011    echo stress with normal LV function., Normal 2-D echo 2008  . GERD (gastroesophageal reflux disease)   . 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 Appendectomy  1957  . Tonsilectomy/adenoidectomy with myringotomy  1963  . Partial hysterectomy  2001  . Cardiac catheterization  2007    Patent coronary  arteries  . Laparoscopic cholecystectomy  2007  . Knee arthroscopy Right 1999     Current Outpatient Prescriptions  Medication Sig Dispense Refill  . amLODipine (NORVASC) 10 MG tablet Take 10 mg by mouth daily.    Marland Kitchen aspirin EC 81 MG tablet Take 81 mg by mouth daily.    Marland Kitchen b complex vitamins tablet Take 1 tablet by mouth daily.    . Cholecalciferol (VITAMIN D-3) 1000 UNITS CAPS Take 2,000 Units by mouth daily.    Marland Kitchen esomeprazole (NEXIUM) 40 MG capsule Take 40 mg by mouth daily as needed.     Marland Kitchen FLAXSEED, LINSEED, PO Take 1,000 mg by mouth daily.    . Lecith-Inosi-Chol-B12-Liver (LIVERITE PO) Take 2 tablets by mouth at bedtime.    . Multiple Minerals-Vitamins (CALCIUM-MAGNESIUM-ZINC-D3) TABS Take 1 tablet by mouth 2 (two) times daily.    . Omega-3 Fatty Acids (FISH OIL) 1000 MG CAPS Take 1 capsule by mouth at bedtime.     . Probiotic Product (PROBIOTIC DAILY PO) Take by mouth daily.    . quinapril (ACCUPRIL) 40 MG tablet Take 40 mg by mouth at bedtime.    . Turmeric Curcumin 500 MG CAPS Take 1 capsule by mouth daily.    . vitamin C (ASCORBIC ACID) 500 MG tablet Take 500 mg by mouth daily.     No current facility-administered medications for this visit.    Allergies:   Statins and Penicillins    Social History:  The patient  reports that she  has never smoked. She has never used smokeless tobacco. She reports that she does not drink alcohol or use illicit drugs.   Family History:  The patient's family history includes Cancer in her father; Colon cancer (age of onset: 44) in her sister; Colon cancer (age of onset: 69) in her father; Heart disease (age of onset: 85) in her mother; Heart failure in her father; Hyperlipidemia in her mother; Hypertension in her mother; Sudden death (age of onset: 31) in her paternal grandfather; Sudden death (age of onset: 19) in her maternal grandfather. There is no history of Esophageal cancer, Rectal cancer, or Stomach cancer.    ROS:  Please see the history  of present illness.    Otherwise, review of systems positive for none.   All other systems are reviewed and negative.    PHYSICAL EXAM: VS:  BP 106/78 mmHg  Pulse 74  Ht 5\' 6"  (1.676 m)  Wt 233 lb (105.688 kg)  BMI 37.63 kg/m2 , BMI Body mass index is 37.63 kg/(m^2).  General: Alert, oriented x3, no distress Head: no evidence of trauma, PERRL, EOMI, no exophtalmos or lid lag, no myxedema, no xanthelasma; normal ears, nose and oropharynx Neck: normal jugular venous pulsations and no hepatojugular reflux; brisk carotid pulses without delay and no carotid bruits Chest: clear to auscultation, no signs of consolidation by percussion or palpation, normal fremitus, symmetrical and full respiratory excursions Cardiovascular: normal position and quality of the apical impulse, regular rhythm, normal first and second heart sounds, no murmurs, rubs or gallops Abdomen: no tenderness or distention, no masses by palpation, no abnormal pulsatility or arterial bruits, normal bowel sounds, no hepatosplenomegaly Extremities: no clubbing, cyanosis or edema; 2+ radial, ulnar and brachial pulses bilaterally; 2+ right femoral, posterior tibial and dorsalis pedis pulses; 2+ left femoral, posterior tibial and dorsalis pedis pulses; no subclavian or femoral bruits Neurological: grossly nonfocal Psych: euthymic mood, full affect   EKG:  EKG is ordered today. The ekg ordered today demonstrates  Most sinus rhythm, QTC 408 ms   Lipid Panel May 2015 total cholesterol of 170, triglycerides 168, HDL 41, LDL 95.    BMET Creatinine 0.87, potassium 4.5, glucose 96, normal LFTs  Wt Readings from Last 3 Encounters:  06/18/15 233 lb (105.688 kg)  07/10/14 230 lb 11.2 oz (104.645 kg)  03/18/14 231 lb (104.781 kg)      ASSESSMENT AND PLAN:  1.  Blood pressure control is excellent. Ankle swelling is probably secondary to amlodipine. Problems with wheezing in the past noted. We'll discontinue the tiny dose of beta  blocker that she is taking.  2.  Updated lipid profile was performed by Dr. Laurann Montana and is reportedly good. I don't have those results yet. No changes made to her medications.   Encouraged her to try alternative methods of exercise such as water aerobics or recumbent bicycle even if she is limited by her back pain and knee pain. Even if she doesn't lose weight, improved fitness will be beneficial in more ways than 1.    Current medicines are reviewed at length with the patient today.  The patient does not have concerns regarding medicines.   Labs/ tests ordered today include:  Orders Placed This Encounter  Procedures  . EKG 12-Lead     Patient Instructions  Medication Instructions:   STOP METOPROLOL    Dr. Sallyanne Kuster recommends that you schedule a follow-up appointment in: ONE YEAR         SignedSanda Klein, MD  06/19/2015 10:23 AM  Sanda Klein, MD, [facility] [phone] office [phone] pager

## 2015-06-24 ENCOUNTER — Encounter: Payer: Self-pay | Admitting: Cardiovascular Disease

## 2015-07-01 ENCOUNTER — Other Ambulatory Visit: Payer: Self-pay

## 2015-07-01 DIAGNOSIS — Z1231 Encounter for screening mammogram for malignant neoplasm of breast: Secondary | ICD-10-CM

## 2015-08-06 ENCOUNTER — Ambulatory Visit
Admission: RE | Admit: 2015-08-06 | Discharge: 2015-08-06 | Disposition: A | Discharge status: Home / Self Care | Payer: Medicare Other | Source: Ambulatory Visit

## 2015-08-06 DIAGNOSIS — Z1231 Encounter for screening mammogram for malignant neoplasm of breast: Secondary | ICD-10-CM | POA: Diagnosis not present

## 2015-11-11 DIAGNOSIS — I129 Hypertensive chronic kidney disease with stage 1 through stage 4 chronic kidney disease, or unspecified chronic kidney disease: Secondary | ICD-10-CM | POA: Diagnosis not present

## 2015-11-11 DIAGNOSIS — N183 Chronic kidney disease, stage 3 (moderate): Secondary | ICD-10-CM | POA: Diagnosis not present

## 2015-11-11 DIAGNOSIS — Z0000 Encounter for general adult medical examination without abnormal findings: Secondary | ICD-10-CM | POA: Diagnosis not present

## 2015-11-11 DIAGNOSIS — J45909 Unspecified asthma, uncomplicated: Secondary | ICD-10-CM | POA: Diagnosis not present

## 2015-11-11 DIAGNOSIS — E669 Obesity, unspecified: Secondary | ICD-10-CM | POA: Diagnosis not present

## 2015-11-11 DIAGNOSIS — M1990 Unspecified osteoarthritis, unspecified site: Secondary | ICD-10-CM | POA: Diagnosis not present

## 2015-11-11 DIAGNOSIS — K219 Gastro-esophageal reflux disease without esophagitis: Secondary | ICD-10-CM | POA: Diagnosis not present

## 2015-11-11 DIAGNOSIS — Z1389 Encounter for screening for other disorder: Secondary | ICD-10-CM | POA: Diagnosis not present

## 2015-11-11 DIAGNOSIS — F39 Unspecified mood [affective] disorder: Secondary | ICD-10-CM | POA: Diagnosis not present

## 2015-11-11 DIAGNOSIS — Z23 Encounter for immunization: Secondary | ICD-10-CM | POA: Diagnosis not present

## 2015-11-11 DIAGNOSIS — E559 Vitamin D deficiency, unspecified: Secondary | ICD-10-CM | POA: Diagnosis not present

## 2015-11-11 DIAGNOSIS — E7800 Pure hypercholesterolemia, unspecified: Secondary | ICD-10-CM | POA: Diagnosis not present

## 2015-11-17 DIAGNOSIS — D2371 Other benign neoplasm of skin of right lower limb, including hip: Secondary | ICD-10-CM | POA: Diagnosis not present

## 2015-11-17 DIAGNOSIS — L821 Other seborrheic keratosis: Secondary | ICD-10-CM | POA: Diagnosis not present

## 2015-11-17 DIAGNOSIS — D225 Melanocytic nevi of trunk: Secondary | ICD-10-CM | POA: Diagnosis not present

## 2015-11-17 DIAGNOSIS — D2372 Other benign neoplasm of skin of left lower limb, including hip: Secondary | ICD-10-CM | POA: Diagnosis not present

## 2015-11-17 DIAGNOSIS — D1801 Hemangioma of skin and subcutaneous tissue: Secondary | ICD-10-CM | POA: Diagnosis not present

## 2015-11-17 DIAGNOSIS — Z85828 Personal history of other malignant neoplasm of skin: Secondary | ICD-10-CM | POA: Diagnosis not present

## 2015-11-17 DIAGNOSIS — L218 Other seborrheic dermatitis: Secondary | ICD-10-CM | POA: Diagnosis not present

## 2015-11-29 DIAGNOSIS — K219 Gastro-esophageal reflux disease without esophagitis: Secondary | ICD-10-CM | POA: Diagnosis not present

## 2015-12-07 DIAGNOSIS — M1711 Unilateral primary osteoarthritis, right knee: Secondary | ICD-10-CM | POA: Diagnosis not present

## 2015-12-07 DIAGNOSIS — M545 Low back pain: Secondary | ICD-10-CM | POA: Diagnosis not present

## 2015-12-15 DIAGNOSIS — G8929 Other chronic pain: Secondary | ICD-10-CM | POA: Diagnosis not present

## 2015-12-15 DIAGNOSIS — M545 Low back pain: Secondary | ICD-10-CM | POA: Diagnosis not present

## 2015-12-17 DIAGNOSIS — M545 Low back pain: Secondary | ICD-10-CM | POA: Diagnosis not present

## 2015-12-17 DIAGNOSIS — G8929 Other chronic pain: Secondary | ICD-10-CM | POA: Diagnosis not present

## 2015-12-22 DIAGNOSIS — M545 Low back pain: Secondary | ICD-10-CM | POA: Diagnosis not present

## 2015-12-22 DIAGNOSIS — G8929 Other chronic pain: Secondary | ICD-10-CM | POA: Diagnosis not present

## 2015-12-23 DIAGNOSIS — F39 Unspecified mood [affective] disorder: Secondary | ICD-10-CM | POA: Diagnosis not present

## 2015-12-23 DIAGNOSIS — E7800 Pure hypercholesterolemia, unspecified: Secondary | ICD-10-CM | POA: Diagnosis not present

## 2015-12-24 DIAGNOSIS — G8929 Other chronic pain: Secondary | ICD-10-CM | POA: Diagnosis not present

## 2015-12-24 DIAGNOSIS — M545 Low back pain: Secondary | ICD-10-CM | POA: Diagnosis not present

## 2015-12-28 DIAGNOSIS — M545 Low back pain: Secondary | ICD-10-CM | POA: Diagnosis not present

## 2015-12-28 DIAGNOSIS — G8929 Other chronic pain: Secondary | ICD-10-CM | POA: Diagnosis not present

## 2015-12-30 DIAGNOSIS — M545 Low back pain: Secondary | ICD-10-CM | POA: Diagnosis not present

## 2015-12-30 DIAGNOSIS — G8929 Other chronic pain: Secondary | ICD-10-CM | POA: Diagnosis not present

## 2016-01-04 DIAGNOSIS — G8929 Other chronic pain: Secondary | ICD-10-CM | POA: Diagnosis not present

## 2016-01-04 DIAGNOSIS — M545 Low back pain: Secondary | ICD-10-CM | POA: Diagnosis not present

## 2016-01-06 DIAGNOSIS — M545 Low back pain: Secondary | ICD-10-CM | POA: Diagnosis not present

## 2016-01-06 DIAGNOSIS — G8929 Other chronic pain: Secondary | ICD-10-CM | POA: Diagnosis not present

## 2016-01-10 DIAGNOSIS — G8929 Other chronic pain: Secondary | ICD-10-CM | POA: Diagnosis not present

## 2016-01-10 DIAGNOSIS — M545 Low back pain: Secondary | ICD-10-CM | POA: Diagnosis not present

## 2016-01-13 DIAGNOSIS — G8929 Other chronic pain: Secondary | ICD-10-CM | POA: Diagnosis not present

## 2016-01-13 DIAGNOSIS — M545 Low back pain: Secondary | ICD-10-CM | POA: Diagnosis not present

## 2016-01-21 DIAGNOSIS — M545 Low back pain: Secondary | ICD-10-CM | POA: Diagnosis not present

## 2016-01-21 DIAGNOSIS — G8929 Other chronic pain: Secondary | ICD-10-CM | POA: Diagnosis not present

## 2016-01-25 DIAGNOSIS — M545 Low back pain: Secondary | ICD-10-CM | POA: Diagnosis not present

## 2016-01-25 DIAGNOSIS — G8929 Other chronic pain: Secondary | ICD-10-CM | POA: Diagnosis not present

## 2016-01-27 DIAGNOSIS — M545 Low back pain: Secondary | ICD-10-CM | POA: Diagnosis not present

## 2016-01-27 DIAGNOSIS — G8929 Other chronic pain: Secondary | ICD-10-CM | POA: Diagnosis not present

## 2016-02-01 DIAGNOSIS — M545 Low back pain: Secondary | ICD-10-CM | POA: Diagnosis not present

## 2016-02-01 DIAGNOSIS — G8929 Other chronic pain: Secondary | ICD-10-CM | POA: Diagnosis not present

## 2016-02-03 DIAGNOSIS — G8929 Other chronic pain: Secondary | ICD-10-CM | POA: Diagnosis not present

## 2016-02-03 DIAGNOSIS — M545 Low back pain: Secondary | ICD-10-CM | POA: Diagnosis not present

## 2016-02-08 DIAGNOSIS — G8929 Other chronic pain: Secondary | ICD-10-CM | POA: Diagnosis not present

## 2016-02-08 DIAGNOSIS — M545 Low back pain: Secondary | ICD-10-CM | POA: Diagnosis not present

## 2016-02-21 DIAGNOSIS — E7800 Pure hypercholesterolemia, unspecified: Secondary | ICD-10-CM | POA: Diagnosis not present

## 2016-03-07 DIAGNOSIS — M1711 Unilateral primary osteoarthritis, right knee: Secondary | ICD-10-CM | POA: Diagnosis not present

## 2016-03-24 ENCOUNTER — Telehealth: Payer: Self-pay

## 2016-03-24 NOTE — Telephone Encounter (Signed)
Faxed surgical clearance to Santiago Bur at [facility].  Patient is cleared for surgery with the following recommendations of 'low risk of major cv complications. Can stop ASA 5 days before surgery if recommended by surgeon.'

## 2016-06-14 DIAGNOSIS — M1711 Unilateral primary osteoarthritis, right knee: Secondary | ICD-10-CM | POA: Diagnosis not present

## 2016-06-15 ENCOUNTER — Encounter: Payer: Self-pay | Admitting: Cardiovascular Disease

## 2016-06-15 ENCOUNTER — Ambulatory Visit (INDEPENDENT_AMBULATORY_CARE_PROVIDER_SITE_OTHER): Payer: Medicare Other | Admitting: Cardiovascular Disease

## 2016-06-15 VITALS — BP 112/82 | HR 92 | Ht 66.0 in | Wt 248.0 lb

## 2016-06-15 DIAGNOSIS — I10 Essential (primary) hypertension: Secondary | ICD-10-CM | POA: Diagnosis not present

## 2016-06-15 DIAGNOSIS — E785 Hyperlipidemia, unspecified: Secondary | ICD-10-CM

## 2016-06-15 DIAGNOSIS — Z01810 Encounter for preprocedural cardiovascular examination: Secondary | ICD-10-CM | POA: Diagnosis not present

## 2016-06-15 MED ORDER — VALSARTAN 160 MG PO TABS: 160.0000 mg | ORAL_TABLET | Freq: Every day | ORAL | Status: DC

## 2016-06-15 NOTE — Patient Instructions (Signed)
Dr Sallyanne Kuster has recommended making the following medication changes: 1. STOP Quinapril 2. START Valsartan 160 mg - take 1 tablet by mouth once daily  Your physician wants you to follow-up in: 1 year with Dr Sallyanne Kuster You will receive a reminder letter in the mail two months in advance. If you don't receive a letter, please call our office to schedule the follow-up appointment.  If you need a refill on your cardiac medications before your next appointment, please call your pharmacy.

## 2016-06-15 NOTE — Progress Notes (Signed)
Cardiology Office Note    Date:  06/16/2016   ID:  [name], DOB [birth year], MRN [id number]  PCP:  Kristina Casco, MD  Cardiologist:   Kristina Klein, MD   Chief Complaint  Patient presents with  . Follow-up    pt having knee surgey 07/21/16    History of Present Illness:  [name] is a 70 y.o. female with obesity and associated hypertension and hyperlipidemia returning for follow-up. She remains frustrated about her inability to lose weight. She is planning right knee replacement on August 11 and hopes that after recovering from this she will be better able to exercise.  She denies exertional angina or dyspnea. She does have swelling in her legs after long car rides. She describes a cough that she believes may be ACE inhibitor related. She states that she has had a dry hacking cough ever since she started this medication, but that it is recently worse.  She has a strong family history of coronary disease and a personal history of hypertension and hyperlipidemia and borderline diabetes mellitus. She has a previous stress test performed in 2008 that did not show signs of coronary disease and underwent cardiac catheterization in 2007 with angiographically patent coronary arteries without meaningful atherosclerosis. She has been intolerant to statin medications and fibrates due to severe myalgia.   Past Medical History  Diagnosis Date  . HTN (hypertension)   . Hyperlipidemia   . Obesity   . Normal cardiac stress test 2011    echo stress with normal LV function., Normal 2-D echo 2008  . GERD (gastroesophageal reflux disease)   . 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 Appendectomy  1957  . Tonsilectomy/adenoidectomy with myringotomy  1963  . Partial hysterectomy  2001  . Cardiac catheterization  2007    Patent coronary arteries  . Laparoscopic cholecystectomy  2007  . Knee arthroscopy Right 1999     Current Medications: Outpatient Prescriptions Prior to Visit  Medication Sig Dispense Refill  . amLODipine (NORVASC) 10 MG tablet Take 10 mg by mouth daily.    Marland Kitchen aspirin EC 81 MG tablet Take 81 mg by mouth daily.    Marland Kitchen b complex vitamins tablet Take 1 tablet by mouth daily.    Marland Kitchen esomeprazole (NEXIUM) 40 MG capsule Take 40 mg by mouth daily as needed.     Marland Kitchen FLAXSEED, LINSEED, PO Take 1,000 mg by mouth daily.    . Lecith-Inosi-Chol-B12-Liver (LIVERITE PO) Take 2 tablets by mouth at bedtime.    . Multiple Minerals-Vitamins (CALCIUM-MAGNESIUM-ZINC-D3) TABS Take 1 tablet by mouth 2 (two) times daily.    . Probiotic Product (PROBIOTIC DAILY PO) Take by mouth daily.    . Turmeric Curcumin 500 MG CAPS Take 1 capsule by mouth daily.    . vitamin C (ASCORBIC ACID) 500 MG tablet Take 500 mg by mouth daily.    . quinapril (ACCUPRIL) 40 MG tablet Take 40 mg by mouth at bedtime.    . Cholecalciferol (VITAMIN D-3) 1000 UNITS CAPS Take 2,000 Units by mouth daily.    . Omega-3 Fatty Acids (FISH OIL) 1000 MG CAPS Take 1 capsule by mouth at bedtime. Reported on 06/15/2016     No facility-administered medications prior to visit.     Allergies:   Statins and Penicillins   Social History   Social History  . Marital Status: Married    Spouse Name: N/A  .  Number of Children: N/A  . Years of Education: N/A   Social History Main Topics  . Smoking status: Never Smoker   . Smokeless tobacco: Never Used  . Alcohol Use: No  . Drug Use: No  . Sexual Activity: Not Asked   Other Topics Concern  . None   Social History Narrative     Family History:  The patient's family history includes Cancer in her father; Colon cancer (age of onset: 42) in her sister; Colon cancer (age of onset: 19) in her father; Heart disease (age of onset: 78) in her mother; Heart failure in her father; Hyperlipidemia in her mother; Hypertension in her mother; Sudden death (age of onset: 60) in her paternal grandfather; Sudden  death (age of onset: 22) in her maternal grandfather. There is no history of Esophageal cancer, Rectal cancer, or Stomach cancer.   ROS:   Please see the history of present illness.    ROS All other systems reviewed and are negative.   PHYSICAL EXAM:   VS:  BP 112/82 mmHg  Pulse 92  Ht 5\' 6"  (1.676 m)  Wt 112.492 kg (248 lb)  BMI 40.05 kg/m2  SpO2 99%   GEN: Morbidly obese, well developed, in no acute distress HEENT: normal Neck: no JVD, carotid bruits, or masses Cardiac: RRR; no murmurs, rubs, or gallops,no edema  Respiratory:  clear to auscultation bilaterally, normal work of breathing GI: soft, nontender, nondistended, + BS MS: no deformity or atrophy Skin: warm and dry, no rash Neuro:  Alert and Oriented x 3, Strength and sensation are intact Psych: euthymic mood, full affect  Wt Readings from Last 3 Encounters:  06/15/16 112.492 kg (248 lb)  06/18/15 105.688 kg (233 lb)  07/10/14 104.645 kg (230 lb 11.2 oz)      Studies/Labs Reviewed:   EKG:  EKG is ordered today.  The ekg ordered today demonstrates Normal sinus rhythm, QTC 422 ms  Recent Labs: Dr. Laurann Montana, March 2017 Glucose 94, creatinine 0.94, potassium 5.4, normal liver function tests cholesterol 182, triglycerides 127, HDL 55, LDL 102   ASSESSMENT:    1. Essential hypertension   2. Hyperlipidemia   3. Morbid obesity due to excess calories ([place])   4. Preoperative cardiovascular examination      PLAN:  In order of problems listed above:  1. HTN: Good blood pressure control; try to substitute ACE inhibitor with a angiotensin receptor blocker due to her cough 2. HLP: Actually a fair lipid profile even without statin therapy 3. Morbid obesity: A lifelong struggle for her, with setback recently. Encouraged her to keep trying. 4. Preoperative risk evaluation: She does not have signs or symptoms of coronary artery disease has well-controlled risk factors and has a normal resting EKG. I believe she is at low  risk for cardiac complications with the surgery. She has an increased risk of DVT/PE due to her morbid obesity. Appropriate DVT prophylaxis measures are indicated.    Medication Adjustments/Labs and Tests Ordered: Current medicines are reviewed at length with the patient today.  Concerns regarding medicines are outlined above.  Medication changes, Labs and Tests ordered today are listed in the Patient Instructions below. Patient Instructions  Dr Sallyanne Kuster has recommended making the following medication changes: 1. STOP Quinapril 2. START Valsartan 160 mg - take 1 tablet by mouth once daily  Your physician wants you to follow-up in: 1 year with Dr Sallyanne Kuster You will receive a reminder letter in the mail two months in advance. If you don't receive  a letter, please call our office to schedule the follow-up appointment.  If you need a refill on your cardiac medications before your next appointment, please call your pharmacy.       Signed, Kristina Klein, MD  06/16/2016 9:06 PM    [facility] [address], [place], [place]  [address] Phone: [phone]; Fax: [phone]

## 2016-06-16 DIAGNOSIS — Z01810 Encounter for preprocedural cardiovascular examination: Secondary | ICD-10-CM | POA: Insufficient documentation

## 2016-06-21 DIAGNOSIS — M545 Low back pain: Secondary | ICD-10-CM | POA: Diagnosis not present

## 2016-06-21 DIAGNOSIS — M1990 Unspecified osteoarthritis, unspecified site: Secondary | ICD-10-CM | POA: Diagnosis not present

## 2016-06-21 DIAGNOSIS — K219 Gastro-esophageal reflux disease without esophagitis: Secondary | ICD-10-CM | POA: Diagnosis not present

## 2016-06-21 DIAGNOSIS — R8299 Other abnormal findings in urine: Secondary | ICD-10-CM | POA: Diagnosis not present

## 2016-06-29 ENCOUNTER — Encounter: Payer: Self-pay | Admitting: Cardiovascular Disease

## 2016-07-04 NOTE — H&P (Signed)
[name] is an 69 y.o. female.    Chief Complaint: right knee pain  HPI: Pt is a 70 y.o. female complaining of right knee pain for multiple years. Pain had continually increased since the beginning. X-rays in the clinic show end-stage arthritic changes of the right knee. Pt has tried various conservative treatments which have failed to alleviate their symptoms, including injections and therapy. Various options are discussed with the patient. Risks, benefits and expectations were discussed with the patient. Patient understand the risks, benefits and expectations and wishes to proceed with surgery.   PCP:  Osborne Casco, MD  D/C Plans: Home  PMH: Past Medical History:  Diagnosis Date  . GERD (gastroesophageal reflux disease)   . HTN (hypertension)   . Hyperlipidemia   . Normal cardiac stress test 2011   echo stress with normal LV function., Normal 2-D echo 2008  . Obesity   . 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 APPENDECTOMY  1957  . CARDIAC CATHETERIZATION  2007   Patent coronary arteries  . KNEE ARTHROSCOPY Right 1999  . LAPAROSCOPIC CHOLECYSTECTOMY  2007  . PARTIAL HYSTERECTOMY  2001  . TONSILECTOMY/ADENOIDECTOMY WITH MYRINGOTOMY  1963    Social History:  reports that she has never smoked. She has never used smokeless tobacco. She reports that she does not drink alcohol or use drugs.  Allergies:  Allergies  Allergen Reactions  . Statins     Legs swell; muscle aches  . Penicillins Rash    Medications: No current facility-administered medications for this encounter.    Current Outpatient Prescriptions  Medication Sig Dispense Refill  . amLODipine (NORVASC) 10 MG tablet Take 10 mg by mouth daily.    Marland Kitchen aspirin EC 81 MG tablet Take 81 mg by mouth daily.    Marland Kitchen b complex vitamins tablet Take 1 tablet by mouth daily.    . Cholecalciferol (VITAMIN D-3) 1000 UNITS CAPS Take 2,000 Units by mouth  daily.    . diphenhydrAMINE (ALLERGY) 25 mg capsule Take 25 mg by mouth every 6 (six) hours as needed.    Marland Kitchen esomeprazole (NEXIUM) 40 MG capsule Take 40 mg by mouth daily as needed.     Marland Kitchen FLAXSEED, LINSEED, PO Take 1,000 mg by mouth daily.    Marland Kitchen glucosamine-chondroitin 500-400 MG tablet Take 1 tablet by mouth 3 (three) times daily.    Marland Kitchen KRILL OIL PO Take 2 capsules by mouth daily.    . Lecith-Inosi-Chol-B12-Liver (LIVERITE PO) Take 2 tablets by mouth at bedtime.    . Multiple Minerals-Vitamins (CALCIUM-MAGNESIUM-ZINC-D3) TABS Take 1 tablet by mouth 2 (two) times daily.    . Probiotic Product (PROBIOTIC DAILY PO) Take by mouth daily.    . Turmeric Curcumin 500 MG CAPS Take 1 capsule by mouth daily.    . valsartan (DIOVAN) 160 MG tablet Take 1 tablet (160 mg total) by mouth daily. 30 tablet 11  . vitamin C (ASCORBIC ACID) 500 MG tablet Take 500 mg by mouth daily.      No results found for this or any previous visit (from the past 48 hour(s)). No results found.  ROS: Pain with rom of the right lower extremity  Physical Exam:  Alert and oriented 70 y.o. female in no acute distress Cranial nerves 2-12 intact Cervical spine: full rom with no tenderness, nv intact distally Chest: active breath sounds bilaterally, no wheeze rhonchi or rales Heart: regular rate and rhythm,  no murmur Abd: non tender non distended with active bowel sounds Hip is stable with rom  Right knee crepitus with rom nv intact distally No rashes  Antalgic gait  Assessment/Plan Assessment: right knee end stage osteoarthritis  Plan: Patient will undergo a right total knee by Dr. Veverly Fells at [facility]. Risks benefits and expectations were discussed with the patient. Patient understand risks, benefits and expectations and wishes to proceed.

## 2016-07-10 ENCOUNTER — Encounter (HOSPITAL_COMMUNITY)
Admission: RE | Admit: 2016-07-10 | Discharge: 2016-07-10 | Disposition: A | Discharge status: Home / Self Care | Payer: Medicare Other | Source: Ambulatory Visit | Attending: Orthopedic Surgery | Admitting: Orthopedic Surgery

## 2016-07-10 ENCOUNTER — Encounter (HOSPITAL_COMMUNITY): Payer: Self-pay

## 2016-07-10 DIAGNOSIS — Z79899 Other long term (current) drug therapy: Secondary | ICD-10-CM | POA: Insufficient documentation

## 2016-07-10 DIAGNOSIS — I10 Essential (primary) hypertension: Secondary | ICD-10-CM | POA: Insufficient documentation

## 2016-07-10 DIAGNOSIS — Z888 Allergy status to other drugs, medicaments and biological substances status: Secondary | ICD-10-CM | POA: Insufficient documentation

## 2016-07-10 DIAGNOSIS — Z880 Allergy status to penicillin: Secondary | ICD-10-CM | POA: Insufficient documentation

## 2016-07-10 DIAGNOSIS — K219 Gastro-esophageal reflux disease without esophagitis: Secondary | ICD-10-CM | POA: Diagnosis not present

## 2016-07-10 DIAGNOSIS — Z01812 Encounter for preprocedural laboratory examination: Secondary | ICD-10-CM | POA: Insufficient documentation

## 2016-07-10 DIAGNOSIS — E785 Hyperlipidemia, unspecified: Secondary | ICD-10-CM | POA: Insufficient documentation

## 2016-07-10 DIAGNOSIS — M1711 Unilateral primary osteoarthritis, right knee: Secondary | ICD-10-CM | POA: Diagnosis not present

## 2016-07-10 HISTORY — DX: Essential (primary) hypertension: I10

## 2016-07-10 LAB — BASIC METABOLIC PANEL
ANION GAP: 9 (ref 5–15)
BUN: 14 mg/dL (ref 6–20)
CALCIUM: 9.7 mg/dL (ref 8.9–10.3)
CO2: 24 mmol/L (ref 22–32)
Chloride: 106 mmol/L (ref 101–111)
Creatinine, Ser: 0.94 mg/dL (ref 0.44–1.00)
GFR calc Af Amer: >60 mL/min (ref >60)
GLUCOSE: 111 mg/dL — AB (ref 65–99)
POTASSIUM: 4.4 mmol/L (ref 3.5–5.1)
SODIUM: 139 mmol/L (ref 135–145)

## 2016-07-10 LAB — CBC
HEMATOCRIT: 46.9 % — AB (ref 36.0–46.0)
Hemoglobin: 15.4 g/dL — ABNORMAL HIGH (ref 12.0–15.0)
MCH: 29 pg (ref 26.0–34.0)
MCHC: 32.8 g/dL (ref 30.0–36.0)
MCV: 88.3 fL (ref 78.0–100.0)
PLATELETS: 214 10*3/uL (ref 150–400)
RBC: 5.31 MIL/uL — ABNORMAL HIGH (ref 3.87–5.11)
RDW: 12.8 % (ref 11.5–15.5)
WBC: 8.9 10*3/uL (ref 4.0–10.5)

## 2016-07-10 LAB — SURGICAL PCR SCREEN
MRSA, PCR: NEGATIVE
STAPHYLOCOCCUS AUREUS: NEGATIVE

## 2016-07-10 NOTE — Progress Notes (Signed)
PCP: Dr. Kelton Pillar Cardiologist: Dr. Sallyanne Kuster  Pt. States Dr. Veverly Fells stated to stop aspirin 5 days prior to surgery

## 2016-07-10 NOTE — Pre-Procedure Instructions (Signed)
    [name]  07/10/2016      [id number] [place] [address] [phone] [phone]    Your procedure is scheduled on Friday, August 11.  Report to [facility] Admitting at 5:30 A.M.               Your surgery or procedure is scheduled for 7:30 AM   Call this number if you have problems the morning of surgery:[phone]                For any other questions, please call [phone], Monday - Friday 8 AM - 4 PM.   Remember:  Do not eat food or drink liquids after midnight Thursday, August 10.  Take these medicines the morning of surgery with A SIP OF WATER: amLODipine (NORVASC).                 Take if needed:esomeprazole ([place]), diphenhydrAMINE (ALLERGY).               Friday, August 4: 1 Week prior to surgery :STOP taking Aspirin , Aspirin Products (Goody Powder, Excedrin Migraine), Ibuprofen (Advil), Naproxen (Aleve), Vitams and Herbal Products (ie Fish Oil, Flaxseed, etc.)                Do not wear jewelry, make-up or nail polish.  Do not wear lotions, powders, or perfumes.    Do not shave 48 hours prior to surgery.   Do not bring valuables to the hospital.  [facility] is not responsible for any belongings or valuables.  Contacts, dentures or bridgework may not be worn into surgery.  Leave your suitcase in the car.  After surgery it may be brought to your room.  For patients admitted to the hospital, discharge time will be determined by your treatment team.  Special instructions:  Review  [place] - Preparing For Surgery.  Please read over the following fact sheets that you were given. [place]- Preparing For Surgery and Patient Instructions for Mupirocin Application and Incentive Spirometry.

## 2016-07-17 ENCOUNTER — Telehealth: Payer: Self-pay | Admitting: Cardiovascular Disease

## 2016-07-17 MED ORDER — QUINAPRIL HCL 40 MG PO TABS: 40.0000 mg | ORAL_TABLET | Freq: Every day | ORAL | 3 refills | Status: DC

## 2016-07-17 NOTE — Telephone Encounter (Signed)
Okay, switch back to quinapril and call us when she feels she has recovered from her knee surgery. By the way: did the cough gets better after she stopped the quinapril?

## 2016-07-17 NOTE — Telephone Encounter (Signed)
New message   Pt c/o medication issue:  1. Name of Medication: Valsartan  2. How are you currently taking this medication (dosage and times per day)? 160mg  1xday  3. Are you having a reaction (difficulty breathing--STAT)? no 4. What is your medication issue? Upset stomach

## 2016-07-17 NOTE — Telephone Encounter (Signed)
Returned call to patient. Patient c/o diarrhea with abdominal cramping and skin feels "tender to the touch, but not red and no rash."  She said these symptoms started about 2 weeks after switching from quinapril to valsartan. She has not made any new dietary changes. She is having knee surgery on Friday and would prefer to go back on the quinapril because she knows she can take that despite the cough, then try new medications after that.  Advised patient I would route this to Dr Sallyanne Kuster for advice.

## 2016-07-17 NOTE — Telephone Encounter (Signed)
Called patient with recommendations. Patient verbalized understanding and agreed to restart quinapril. Patient understanding to call when she has recovered from surgery.  Patient stated that her cough did get better, but "she'd rather deal with a cough than her other symptoms with surgery coming up."  New refill for quinapril 40 mg sent to the pharmacy electronically.

## 2016-07-20 MED ORDER — CLINDAMYCIN PHOSPHATE 900 MG/50ML IV SOLN
900.0000 mg | INTRAVENOUS | Status: AC
  Administered 2016-07-21: 900 mg via INTRAVENOUS
  Filled 2016-07-20: qty 50

## 2016-07-21 ENCOUNTER — Inpatient Hospital Stay (HOSPITAL_COMMUNITY): Payer: Medicare Other | Admitting: Anesthesiology

## 2016-07-21 ENCOUNTER — Encounter (HOSPITAL_COMMUNITY)
Admission: RE | Disposition: A | Discharge status: Home Health | Payer: Self-pay | Source: Ambulatory Visit | Attending: Orthopedic Surgery

## 2016-07-21 ENCOUNTER — Encounter (HOSPITAL_COMMUNITY): Payer: Self-pay | Admitting: *Deleted

## 2016-07-21 ENCOUNTER — Inpatient Hospital Stay (HOSPITAL_COMMUNITY)
Admission: RE | Admit: 2016-07-21 | Discharge: 2016-07-23 | DRG: 470 | Disposition: A | Discharge status: Home Health | Payer: Medicare Other | Source: Ambulatory Visit | Attending: Orthopedic Surgery | Admitting: Orthopedic Surgery

## 2016-07-21 DIAGNOSIS — Z880 Allergy status to penicillin: Secondary | ICD-10-CM

## 2016-07-21 DIAGNOSIS — Z6841 Body Mass Index (BMI) 40.0 and over, adult: Secondary | ICD-10-CM

## 2016-07-21 DIAGNOSIS — M1711 Unilateral primary osteoarthritis, right knee: Principal | ICD-10-CM | POA: Diagnosis present

## 2016-07-21 DIAGNOSIS — K219 Gastro-esophageal reflux disease without esophagitis: Secondary | ICD-10-CM | POA: Diagnosis present

## 2016-07-21 DIAGNOSIS — Z888 Allergy status to other drugs, medicaments and biological substances status: Secondary | ICD-10-CM | POA: Diagnosis not present

## 2016-07-21 DIAGNOSIS — E785 Hyperlipidemia, unspecified: Secondary | ICD-10-CM | POA: Diagnosis present

## 2016-07-21 DIAGNOSIS — Z79899 Other long term (current) drug therapy: Secondary | ICD-10-CM

## 2016-07-21 DIAGNOSIS — I10 Essential (primary) hypertension: Secondary | ICD-10-CM | POA: Diagnosis present

## 2016-07-21 DIAGNOSIS — Z85828 Personal history of other malignant neoplasm of skin: Secondary | ICD-10-CM | POA: Diagnosis not present

## 2016-07-21 DIAGNOSIS — M179 Osteoarthritis of knee, unspecified: Secondary | ICD-10-CM | POA: Diagnosis not present

## 2016-07-21 DIAGNOSIS — E669 Obesity, unspecified: Secondary | ICD-10-CM | POA: Diagnosis not present

## 2016-07-21 DIAGNOSIS — Z7982 Long term (current) use of aspirin: Secondary | ICD-10-CM | POA: Diagnosis not present

## 2016-07-21 DIAGNOSIS — Z96651 Presence of right artificial knee joint: Secondary | ICD-10-CM

## 2016-07-21 DIAGNOSIS — G8918 Other acute postprocedural pain: Secondary | ICD-10-CM | POA: Diagnosis not present

## 2016-07-21 DIAGNOSIS — Z96659 Presence of unspecified artificial knee joint: Secondary | ICD-10-CM

## 2016-07-21 HISTORY — PX: TOTAL KNEE ARTHROPLASTY: SHX125

## 2016-07-21 LAB — CBC
HEMATOCRIT: 41 % (ref 36.0–46.0)
HEMOGLOBIN: 13.6 g/dL (ref 12.0–15.0)
MCH: 28.9 pg (ref 26.0–34.0)
MCHC: 33.2 g/dL (ref 30.0–36.0)
MCV: 87 fL (ref 78.0–100.0)
Platelets: 211 10*3/uL (ref 150–400)
RBC: 4.71 MIL/uL (ref 3.87–5.11)
RDW: 12.6 % (ref 11.5–15.5)
WBC: 17.3 10*3/uL — ABNORMAL HIGH (ref 4.0–10.5)

## 2016-07-21 LAB — CREATININE, SERUM
Creatinine, Ser: 0.86 mg/dL (ref 0.44–1.00)
GFR calc non Af Amer: >60 mL/min (ref >60)

## 2016-07-21 SURGERY — ARTHROPLASTY, KNEE, TOTAL
Anesthesia: Regional | Site: Knee | Laterality: Right

## 2016-07-21 MED ORDER — LIVERITE 1.125 MCG PO TABS: ORAL_TABLET | Freq: Every day | ORAL | Status: DC

## 2016-07-21 MED ORDER — POLYETHYLENE GLYCOL 3350 17 G PO PACK: 17.0000 g | PACK | Freq: Every day | ORAL | Status: DC | PRN

## 2016-07-21 MED ORDER — BUPIVACAINE-EPINEPHRINE (PF) 0.5% -1:200000 IJ SOLN
INTRAMUSCULAR | Status: DC | PRN
  Administered 2016-07-21: 30 mL via PERINEURAL

## 2016-07-21 MED ORDER — PANTOPRAZOLE SODIUM 40 MG PO TBEC: 40.0000 mg | DELAYED_RELEASE_TABLET | Freq: Every day | ORAL | Status: DC

## 2016-07-21 MED ORDER — ONDANSETRON HCL 4 MG/2ML IJ SOLN
INTRAMUSCULAR | Status: DC | PRN
  Administered 2016-07-21: 4 mg via INTRAVENOUS

## 2016-07-21 MED ORDER — PHENYLEPHRINE HCL 10 MG/ML IJ SOLN
INTRAVENOUS | Status: DC | PRN
  Administered 2016-07-21: 25 ug/min via INTRAVENOUS

## 2016-07-21 MED ORDER — FENTANYL CITRATE (PF) 100 MCG/2ML IJ SOLN
25.0000 ug | INTRAMUSCULAR | Status: DC | PRN
  Administered 2016-07-21 (×2): 50 ug via INTRAVENOUS

## 2016-07-21 MED ORDER — PHENYLEPHRINE 40 MCG/ML (10ML) SYRINGE FOR IV PUSH (FOR BLOOD PRESSURE SUPPORT)
PREFILLED_SYRINGE | INTRAVENOUS | Status: AC
  Filled 2016-07-21: qty 10

## 2016-07-21 MED ORDER — SODIUM CHLORIDE 0.9 % IV SOLN
INTRAVENOUS | Status: DC
Start: 2016-07-21 — End: 2016-07-23
  Administered 2016-07-21: 14:00:00 via INTRAVENOUS

## 2016-07-21 MED ORDER — SODIUM CHLORIDE 0.9 % IR SOLN
Status: DC | PRN
  Administered 2016-07-21: 3000 mL

## 2016-07-21 MED ORDER — BISACODYL 10 MG RE SUPP: 10.0000 mg | Freq: Every day | RECTAL | Status: DC | PRN

## 2016-07-21 MED ORDER — GLUCOSAMINE-CHONDROITIN 500-400 MG PO TABS: 1.0000 | ORAL_TABLET | Freq: Two times a day (BID) | ORAL | Status: DC

## 2016-07-21 MED ORDER — METHOCARBAMOL 1000 MG/10ML IJ SOLN
500.0000 mg | Freq: Four times a day (QID) | INTRAVENOUS | Status: DC | PRN
  Filled 2016-07-21: qty 5

## 2016-07-21 MED ORDER — FENTANYL CITRATE (PF) 100 MCG/2ML IJ SOLN
INTRAMUSCULAR | Status: AC
  Administered 2016-07-21: 50 ug via INTRAVENOUS
  Filled 2016-07-21: qty 2

## 2016-07-21 MED ORDER — VITAMIN D-3 25 MCG (1000 UT) PO CAPS: 2000.0000 [IU] | ORAL_CAPSULE | Freq: Every day | ORAL | Status: DC

## 2016-07-21 MED ORDER — METHOCARBAMOL 500 MG PO TABS
500.0000 mg | ORAL_TABLET | Freq: Four times a day (QID) | ORAL | Status: DC | PRN
  Administered 2016-07-22 – 2016-07-23 (×2): 500 mg via ORAL
  Filled 2016-07-21 (×4): qty 1

## 2016-07-21 MED ORDER — METOCLOPRAMIDE HCL 5 MG PO TABS: 5.0000 mg | ORAL_TABLET | Freq: Three times a day (TID) | ORAL | Status: DC | PRN

## 2016-07-21 MED ORDER — FENTANYL CITRATE (PF) 100 MCG/2ML IJ SOLN
INTRAMUSCULAR | Status: DC | PRN
  Administered 2016-07-21 (×2): 50 ug via INTRAVENOUS
  Administered 2016-07-21 (×2): 25 ug via INTRAVENOUS
  Administered 2016-07-21 (×2): 50 ug via INTRAVENOUS

## 2016-07-21 MED ORDER — DIPHENHYDRAMINE HCL 25 MG PO CAPS: 25.0000 mg | ORAL_CAPSULE | Freq: Four times a day (QID) | ORAL | Status: DC | PRN

## 2016-07-21 MED ORDER — CLINDAMYCIN PHOSPHATE 600 MG/50ML IV SOLN
600.0000 mg | Freq: Four times a day (QID) | INTRAVENOUS | Status: AC
  Administered 2016-07-21 (×2): 600 mg via INTRAVENOUS
  Filled 2016-07-21 (×2): qty 50

## 2016-07-21 MED ORDER — QUINAPRIL HCL 10 MG PO TABS
40.0000 mg | ORAL_TABLET | Freq: Every day | ORAL | Status: DC
  Administered 2016-07-21 – 2016-07-22 (×2): 40 mg via ORAL
  Filled 2016-07-21 (×3): qty 4

## 2016-07-21 MED ORDER — ONDANSETRON HCL 4 MG PO TABS: 4.0000 mg | ORAL_TABLET | Freq: Four times a day (QID) | ORAL | Status: DC | PRN

## 2016-07-21 MED ORDER — ENOXAPARIN SODIUM 30 MG/0.3ML ~~LOC~~ SOLN
30.0000 mg | Freq: Two times a day (BID) | SUBCUTANEOUS | Status: DC
  Administered 2016-07-22 – 2016-07-23 (×3): 30 mg via SUBCUTANEOUS
  Filled 2016-07-21 (×3): qty 0.3

## 2016-07-21 MED ORDER — LIDOCAINE 2% (20 MG/ML) 5 ML SYRINGE
INTRAMUSCULAR | Status: AC
  Filled 2016-07-21: qty 5

## 2016-07-21 MED ORDER — MIDAZOLAM HCL 2 MG/2ML IJ SOLN
INTRAMUSCULAR | Status: AC
  Filled 2016-07-21: qty 2

## 2016-07-21 MED ORDER — WARFARIN SODIUM 5 MG PO TABS: 5.0000 mg | ORAL_TABLET | Freq: Every day | ORAL | 0 refills | Status: DC

## 2016-07-21 MED ORDER — METHOCARBAMOL 500 MG PO TABS: 500.0000 mg | ORAL_TABLET | Freq: Three times a day (TID) | ORAL | 1 refills | Status: DC | PRN

## 2016-07-21 MED ORDER — ONDANSETRON HCL 4 MG/2ML IJ SOLN
INTRAMUSCULAR | Status: AC
  Filled 2016-07-21: qty 2

## 2016-07-21 MED ORDER — WARFARIN - PHARMACIST DOSING INPATIENT
Freq: Every day | Status: DC
  Administered 2016-07-21: 17:00:00

## 2016-07-21 MED ORDER — LIDOCAINE 2% (20 MG/ML) 5 ML SYRINGE
INTRAMUSCULAR | Status: DC | PRN
  Administered 2016-07-21: 100 mg via INTRAVENOUS

## 2016-07-21 MED ORDER — MIDAZOLAM HCL 5 MG/5ML IJ SOLN
INTRAMUSCULAR | Status: DC | PRN
  Administered 2016-07-21: 2 mg via INTRAVENOUS

## 2016-07-21 MED ORDER — AMLODIPINE BESYLATE 10 MG PO TABS
10.0000 mg | ORAL_TABLET | Freq: Every day | ORAL | Status: DC
  Administered 2016-07-22 – 2016-07-23 (×2): 10 mg via ORAL
  Filled 2016-07-21 (×2): qty 1

## 2016-07-21 MED ORDER — ONDANSETRON HCL 4 MG/2ML IJ SOLN: 4.0000 mg | Freq: Four times a day (QID) | INTRAMUSCULAR | Status: DC | PRN

## 2016-07-21 MED ORDER — ASPIRIN EC 81 MG PO TBEC
81.0000 mg | DELAYED_RELEASE_TABLET | Freq: Every day | ORAL | Status: DC
  Administered 2016-07-22 – 2016-07-23 (×2): 81 mg via ORAL
  Filled 2016-07-21 (×2): qty 1

## 2016-07-21 MED ORDER — 0.9 % SODIUM CHLORIDE (POUR BTL) OPTIME
TOPICAL | Status: DC | PRN
  Administered 2016-07-21: 1000 mL

## 2016-07-21 MED ORDER — METHOCARBAMOL 1000 MG/10ML IJ SOLN
500.0000 mg | INTRAMUSCULAR | Status: AC
  Administered 2016-07-21: 500 mg via INTRAVENOUS
  Filled 2016-07-21: qty 5

## 2016-07-21 MED ORDER — WARFARIN SODIUM 7.5 MG PO TABS
7.5000 mg | ORAL_TABLET | Freq: Every day | ORAL | Status: DC
  Administered 2016-07-21 – 2016-07-22 (×2): 7.5 mg via ORAL
  Filled 2016-07-21 (×2): qty 1

## 2016-07-21 MED ORDER — KRILL OIL 500 MG PO CAPS: ORAL_CAPSULE | Freq: Every day | ORAL | Status: DC

## 2016-07-21 MED ORDER — PROPOFOL 10 MG/ML IV BOLUS
INTRAVENOUS | Status: AC
  Filled 2016-07-21: qty 20

## 2016-07-21 MED ORDER — DOCUSATE SODIUM 100 MG PO CAPS
100.0000 mg | ORAL_CAPSULE | Freq: Two times a day (BID) | ORAL | Status: DC
  Administered 2016-07-21 – 2016-07-23 (×4): 100 mg via ORAL
  Filled 2016-07-21 (×4): qty 1

## 2016-07-21 MED ORDER — LACTATED RINGERS IV SOLN
INTRAVENOUS | Status: DC | PRN
  Administered 2016-07-21 (×2): via INTRAVENOUS

## 2016-07-21 MED ORDER — TRANEXAMIC ACID 1000 MG/10ML IV SOLN
2000.0000 mg | INTRAVENOUS | Status: AC
  Administered 2016-07-21: 2000 mg via TOPICAL
  Filled 2016-07-21: qty 20

## 2016-07-21 MED ORDER — TURMERIC CURCUMIN 500 MG PO CAPS: 1.0000 | ORAL_CAPSULE | Freq: Every day | ORAL | Status: DC

## 2016-07-21 MED ORDER — VITAMIN C 500 MG PO TABS
500.0000 mg | ORAL_TABLET | Freq: Every day | ORAL | Status: DC
  Administered 2016-07-22 – 2016-07-23 (×2): 500 mg via ORAL
  Filled 2016-07-21 (×2): qty 1

## 2016-07-21 MED ORDER — CHLORHEXIDINE GLUCONATE 4 % EX LIQD: 60.0000 mL | Freq: Once | CUTANEOUS | Status: DC

## 2016-07-21 MED ORDER — FERROUS SULFATE 325 (65 FE) MG PO TABS
325.0000 mg | ORAL_TABLET | Freq: Three times a day (TID) | ORAL | Status: DC
  Administered 2016-07-21 – 2016-07-23 (×7): 325 mg via ORAL
  Filled 2016-07-21 (×7): qty 1

## 2016-07-21 MED ORDER — ACETAMINOPHEN 325 MG PO TABS
650.0000 mg | ORAL_TABLET | Freq: Four times a day (QID) | ORAL | Status: DC | PRN
  Administered 2016-07-21: 650 mg via ORAL
  Filled 2016-07-21: qty 2

## 2016-07-21 MED ORDER — PHENYLEPHRINE HCL 10 MG/ML IJ SOLN
INTRAMUSCULAR | Status: DC | PRN
  Administered 2016-07-21: 80 ug via INTRAVENOUS

## 2016-07-21 MED ORDER — PROPOFOL 10 MG/ML IV BOLUS
INTRAVENOUS | Status: DC | PRN
  Administered 2016-07-21: 150 mg via INTRAVENOUS

## 2016-07-21 MED ORDER — OXYCODONE HCL 5 MG PO TABS
5.0000 mg | ORAL_TABLET | ORAL | Status: DC | PRN
  Administered 2016-07-21 – 2016-07-23 (×10): 10 mg via ORAL
  Filled 2016-07-21 (×10): qty 2

## 2016-07-21 MED ORDER — ACETAMINOPHEN 650 MG RE SUPP: 650.0000 mg | Freq: Four times a day (QID) | RECTAL | Status: DC | PRN

## 2016-07-21 MED ORDER — MENTHOL 3 MG MT LOZG: 1.0000 | LOZENGE | OROMUCOSAL | Status: DC | PRN

## 2016-07-21 MED ORDER — METOCLOPRAMIDE HCL 5 MG/ML IJ SOLN: 5.0000 mg | Freq: Three times a day (TID) | INTRAMUSCULAR | Status: DC | PRN

## 2016-07-21 MED ORDER — MORPHINE SULFATE (PF) 2 MG/ML IV SOLN
2.0000 mg | INTRAVENOUS | Status: DC | PRN
  Administered 2016-07-22: 2 mg via INTRAVENOUS
  Filled 2016-07-21: qty 1

## 2016-07-21 MED ORDER — OXYCODONE-ACETAMINOPHEN 5-325 MG PO TABS: 1.0000 | ORAL_TABLET | ORAL | 0 refills | Status: DC | PRN

## 2016-07-21 MED ORDER — WARFARIN VIDEO
Freq: Once | Status: AC
  Administered 2016-07-21: 15:00:00

## 2016-07-21 MED ORDER — VITAMIN B-12 1000 MCG PO TABS: 1000.0000 ug | ORAL_TABLET | Freq: Every day | ORAL | Status: DC

## 2016-07-21 MED ORDER — FENTANYL CITRATE (PF) 250 MCG/5ML IJ SOLN
INTRAMUSCULAR | Status: AC
  Filled 2016-07-21: qty 5

## 2016-07-21 MED ORDER — PROMETHAZINE HCL 25 MG/ML IJ SOLN: 6.2500 mg | INTRAMUSCULAR | Status: DC | PRN

## 2016-07-21 MED ORDER — PANTOPRAZOLE SODIUM 40 MG PO TBEC
80.0000 mg | DELAYED_RELEASE_TABLET | Freq: Every day | ORAL | Status: DC
  Administered 2016-07-22 – 2016-07-23 (×2): 80 mg via ORAL
  Filled 2016-07-21 (×2): qty 2

## 2016-07-21 MED ORDER — PHENOL 1.4 % MT LIQD: 1.0000 | OROMUCOSAL | Status: DC | PRN

## 2016-07-21 MED ORDER — PATIENT'S GUIDE TO USING COUMADIN BOOK
Freq: Once | Status: AC
  Administered 2016-07-21: 12:00:00
  Filled 2016-07-21: qty 1

## 2016-07-21 MED ORDER — TRANEXAMIC ACID 1000 MG/10ML IV SOLN
1000.0000 mg | INTRAVENOUS | Status: AC
  Administered 2016-07-21: 1000 mg via INTRAVENOUS
  Filled 2016-07-21: qty 10

## 2016-07-21 SURGICAL SUPPLY — 61 items
BANDAGE ESMARK 6X9 LF (GAUZE/BANDAGES/DRESSINGS) ×1 IMPLANT
BLADE SAG 18X100X1.27 (BLADE) ×2 IMPLANT
BLADE SAW SGTL 13X75X1.27 (BLADE) ×2 IMPLANT
BLADE SAW SGTL 18X1.27X75 (BLADE) ×2 IMPLANT
BNDG ELASTIC 6X10 VLCR STRL LF (GAUZE/BANDAGES/DRESSINGS) ×2 IMPLANT
BNDG ESMARK 6X9 LF (GAUZE/BANDAGES/DRESSINGS) ×2
BNDG GAUZE ELAST 4 BULKY (GAUZE/BANDAGES/DRESSINGS) ×4 IMPLANT
BOWL SMART MIX CTS (DISPOSABLE) ×2 IMPLANT
CAP KNEE TOTAL 3 SIGMA ×2 IMPLANT
CEMENT HV SMART SET (Cement) ×4 IMPLANT
COVER SURGICAL LIGHT HANDLE (MISCELLANEOUS) ×2 IMPLANT
CUFF TOURNIQUET SINGLE 34IN LL (TOURNIQUET CUFF) IMPLANT
CUFF TOURNIQUET SINGLE 44IN (TOURNIQUET CUFF) IMPLANT
DRAPE EXTREMITY T 121X128X90 (DRAPE) ×2 IMPLANT
DRAPE IMP U-DRAPE 54X76 (DRAPES) ×2 IMPLANT
DRAPE PROXIMA HALF (DRAPES) ×2 IMPLANT
DRAPE U-SHAPE 47X51 STRL (DRAPES) ×2 IMPLANT
DRSG ADAPTIC 3X8 NADH LF (GAUZE/BANDAGES/DRESSINGS) ×2 IMPLANT
DRSG PAD ABDOMINAL 8X10 ST (GAUZE/BANDAGES/DRESSINGS) ×2 IMPLANT
DURAPREP 26ML APPLICATOR (WOUND CARE) ×2 IMPLANT
ELECT CAUTERY BLADE 6.4 (BLADE) ×2 IMPLANT
ELECT REM PT RETURN 9FT ADLT (ELECTROSURGICAL) ×2
ELECTRODE REM PT RTRN 9FT ADLT (ELECTROSURGICAL) ×1 IMPLANT
FACESHIELD WRAPAROUND (MASK) ×2 IMPLANT
GAUZE SPONGE 4X4 12PLY STRL (GAUZE/BANDAGES/DRESSINGS) ×2 IMPLANT
GLOVE BIOGEL PI ORTHO PRO 7.5 (GLOVE) ×1
GLOVE BIOGEL PI ORTHO PRO SZ8 (GLOVE) ×1
GLOVE ORTHO TXT STRL SZ7.5 (GLOVE) ×2 IMPLANT
GLOVE PI ORTHO PRO STRL 7.5 (GLOVE) ×1 IMPLANT
GLOVE PI ORTHO PRO STRL SZ8 (GLOVE) ×1 IMPLANT
GLOVE SURG ORTHO 8.5 STRL (GLOVE) ×2 IMPLANT
GOWN STRL REUS W/ TWL XL LVL3 (GOWN DISPOSABLE) ×3 IMPLANT
GOWN STRL REUS W/TWL XL LVL3 (GOWN DISPOSABLE) ×3
HANDPIECE INTERPULSE COAX TIP (DISPOSABLE) ×1
IMMOBILIZER KNEE 22 UNIV (SOFTGOODS) ×2 IMPLANT
KIT BASIN OR (CUSTOM PROCEDURE TRAY) ×2 IMPLANT
KIT MANIFOLD (MISCELLANEOUS) ×2 IMPLANT
KIT ROOM TURNOVER OR (KITS) ×2 IMPLANT
MANIFOLD NEPTUNE II (INSTRUMENTS) ×2 IMPLANT
NS IRRIG 1000ML POUR BTL (IV SOLUTION) ×2 IMPLANT
PACK TOTAL JOINT (CUSTOM PROCEDURE TRAY) ×2 IMPLANT
PACK UNIVERSAL I (CUSTOM PROCEDURE TRAY) IMPLANT
PAD ARMBOARD 7.5X6 YLW CONV (MISCELLANEOUS) ×4 IMPLANT
SET HNDPC FAN SPRY TIP SCT (DISPOSABLE) ×1 IMPLANT
STRIP CLOSURE SKIN 1/2X4 (GAUZE/BANDAGES/DRESSINGS) ×2 IMPLANT
SUCTION FRAZIER HANDLE 10FR (MISCELLANEOUS) ×1
SUCTION TUBE FRAZIER 10FR DISP (MISCELLANEOUS) ×1 IMPLANT
SUT MNCRL AB 3-0 PS2 18 (SUTURE) ×2 IMPLANT
SUT VIC AB 0 CT1 27 (SUTURE) ×2
SUT VIC AB 0 CT1 27XBRD ANBCTR (SUTURE) ×2 IMPLANT
SUT VIC AB 1 CT1 27 (SUTURE) ×3
SUT VIC AB 1 CT1 27XBRD ANBCTR (SUTURE) ×3 IMPLANT
SUT VIC AB 2-0 CT1 27 (SUTURE) ×2
SUT VIC AB 2-0 CT1 TAPERPNT 27 (SUTURE) ×2 IMPLANT
TOWEL OR 17X24 6PK STRL BLUE (TOWEL DISPOSABLE) ×2 IMPLANT
TOWEL OR 17X26 10 PK STRL BLUE (TOWEL DISPOSABLE) ×2 IMPLANT
TRAY FOLEY CATH 16FRSI W/METER (SET/KITS/TRAYS/PACK) ×2 IMPLANT
TUBE CONNECTING 12X1/4 (SUCTIONS) ×2 IMPLANT
UPCHARGE REV TRAY MBT KNEE ×2 IMPLANT
WATER STERILE IRR 1000ML POUR (IV SOLUTION) IMPLANT
YANKAUER SUCT BULB TIP NO VENT (SUCTIONS) ×2 IMPLANT

## 2016-07-21 NOTE — Anesthesia Postprocedure Evaluation (Addendum)
Anesthesia Post Note  Patient: [name]  Procedure(s) Performed: Procedure(s) (LRB): RIGHT TOTAL KNEE ARTHROPLASTY (Right)  Patient location during evaluation: PACU Anesthesia Type: General and Regional Level of consciousness: awake and alert Pain management: pain level controlled Vital Signs Assessment: post-procedure vital signs reviewed and stable Respiratory status: spontaneous breathing, nonlabored ventilation and respiratory function stable Cardiovascular status: blood pressure returned to baseline and stable Postop Assessment: no signs of nausea or vomiting Anesthetic complications: no    Last Vitals:  Vitals:   07/21/16 1100 07/21/16 1230  BP:  (!) 126/58  Pulse:  75  Resp:  16  Temp: 36.7 C 36.7 C    Last Pain:  Vitals:   07/21/16 1306  PainSc: 8                  Nilda Simmer

## 2016-07-21 NOTE — Anesthesia Procedure Notes (Signed)
Anesthesia Regional Block:  Adductor canal block  Pre-Anesthetic Checklist: ,, timeout performed, Correct Patient, Correct Site, Correct Laterality, Correct Procedure, Correct Position, site marked, Risks and benefits discussed,  Surgical consent,  Pre-op evaluation,  At surgeon's request and post-op pain management  Laterality: Right  Prep: chloraprep       Needles:  Injection technique: Single-shot  Needle Type: Stimiplex     Needle Length: 9cm 9 cm Needle Gauge: 21 and 21 G    Additional Needles:  Procedures: ultrasound guided (picture in chart) Adductor canal block Narrative:  Injection made incrementally with aspirations every 5 mL.  Performed by: Personally  Anesthesiologist: Nilda Simmer

## 2016-07-21 NOTE — Progress Notes (Signed)
ANTICOAGULATION CONSULT NOTE - Initial Consult  Pharmacy Consult for Coumadin Indication: VTE prophylaxis  Allergies  Allergen Reactions  . Statins Swelling and Other (See Comments)    MYALGIAS SWELLING OF LEGS  . Losartan     sick  . Penicillins Rash    Has patient had a PCN reaction causing immediate rash, facial/tongue/throat swelling, SOB or lightheadedness with hypotension: no Has patient had a PCN reaction causing severe rash involving mucus membranes or skin necrosis: no Has patient had a PCN reaction that required hospitalization no Has patient had a PCN reaction occurring within the last 10 years: no If all of the above answers are "NO", then may proceed with Cephalosporin use.    Patient Measurements: Weight: 250 lb (113.4 kg)   Vital Signs: Temp: 98.1 F (36.7 C) (08/11 1100) BP: 138/73 (08/11 1030) Pulse Rate: 87 (08/11 1010)  Labs: No results for input(s): HGB, HCT, PLT, APTT, LABPROT, INR, HEPARINUNFRC, HEPRLOWMOCWT, CREATININE, CKTOTAL, CKMB, TROPONINI in the last 72 hours.  Estimated Creatinine Clearance: 72.1 mL/min (by C-G formula based on SCr of 0.94 mg/dL).   Medical History: Past Medical History:  Diagnosis Date  . GERD (gastroesophageal reflux disease)   . HTN (hypertension)   . Hyperlipidemia   . Hypertension   . Normal cardiac stress test 2011   echo stress with normal LV function., Normal 2-D echo 2008  . Obesity   . 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

## 2016-07-21 NOTE — Brief Op Note (Signed)
07/21/2016  10:10 AM  PATIENT:  [name]  70 y.o. female  PRE-OPERATIVE DIAGNOSIS:  right knee osteoarthritis end stage  POST-OPERATIVE DIAGNOSIS:  right knee osteoarthritis end stage  PROCEDURE:  Procedure(s): RIGHT TOTAL KNEE ARTHROPLASTY (Right) Sigma RP  SURGEON:  Surgeon(s) and Role:    * Netta Cedars, MD - Primary  PHYSICIAN ASSISTANT:   ASSISTANTS: Ventura Bruns, PA-C   ANESTHESIA:   regional and general  EBL:  Total I/O In: 1000 [I.V.:1000] Out: 90 [Urine:90]  BLOOD ADMINISTERED:none  DRAINS: none   LOCAL MEDICATIONS USED:  NONE   SPECIMEN:  No Specimen  DISPOSITION OF SPECIMEN:  N/A  COUNTS:  YES  TOURNIQUET:   Total Tourniquet Time Documented: Thigh (Right) - 124 minutes Total: Thigh (Right) - 124 minutes   DICTATION: .Other Dictation: Dictation Number [id number]  PLAN OF CARE: Admit to inpatient   PATIENT DISPOSITION:  PACU - hemodynamically stable.   Delay start of Pharmacological VTE agent (>24hrs) due to surgical blood loss or risk of bleeding: no

## 2016-07-21 NOTE — Progress Notes (Signed)
Orthopedic Tech Progress Note Patient Details:  [name] [birth year] [id number]  CPM Right Knee CPM Right Knee: On Right Knee Flexion (Degrees): 90 Right Knee Extension (Degrees): 0 Additional Comments: trapeze bar patient helper   [name] 07/21/2016, 12:38 PM Viewed order from doctor's order list

## 2016-07-21 NOTE — Interval H&P Note (Signed)
History and Physical Interval Note:  07/21/2016 7:27 AM  [name]  has presented today for surgery, with the diagnosis of right knee osteoarthritis end stage  The various methods of treatment have been discussed with the patient and family. After consideration of risks, benefits and other options for treatment, the patient has consented to  Procedure(s): RIGHT TOTAL KNEE ARTHROPLASTY (Right) as a surgical intervention .  The patient's history has been reviewed, patient examined, no change in status, stable for surgery.  I have reviewed the patient's chart and labs.  Questions were answered to the patient's satisfaction.     Javin Nong,STEVEN R

## 2016-07-21 NOTE — Transfer of Care (Signed)
Immediate Anesthesia Transfer of Care Note  Patient: [name]  Procedure(s) Performed: Procedure(s): RIGHT TOTAL KNEE ARTHROPLASTY (Right)  Patient Location: PACU  Anesthesia Type:GA combined with regional for post-op pain  Level of Consciousness: awake, alert  and oriented  Airway & Oxygen Therapy: Patient Spontanous Breathing and Patient connected to face mask oxygen  Post-op Assessment: Report given to RN, Post -op Vital signs reviewed and stable and Patient moving all extremities  Post vital signs: Reviewed and stable  Last Vitals:  Vitals:   07/21/16 0552  BP: 132/85  Pulse: 98  Resp: 20  Temp: 36.9 C    Last Pain:  Vitals:   07/21/16 0552  PainSc: 7       Patients Stated Pain Goal: 2 ([id number])  Complications: No apparent anesthesia complications

## 2016-07-21 NOTE — Anesthesia Procedure Notes (Signed)
Procedure Name: LMA Insertion Date/Time: 07/21/2016 7:39 AM Performed by: Rush Farmer E Pre-anesthesia Checklist: Patient identified, Emergency Drugs available, Suction available, Patient being monitored and Timeout performed Patient Re-evaluated:Patient Re-evaluated prior to inductionOxygen Delivery Method: Circle system utilized Preoxygenation: Pre-oxygenation with 100% oxygen Intubation Type: IV induction LMA: LMA inserted LMA Size: 4.0 Number of attempts: 1 Placement Confirmation: positive ETCO2 and breath sounds checked- equal and bilateral Tube secured with: Tape Dental Injury: Teeth and Oropharynx as per pre-operative assessment

## 2016-07-21 NOTE — Anesthesia Preprocedure Evaluation (Addendum)
Anesthesia Evaluation  Patient identified by MRN, date of birth, ID band Patient awake    Reviewed: Allergy & Precautions, NPO status , Patient's Chart, lab work & pertinent test results  History of Anesthesia Complications Negative for: history of anesthetic complications  Airway Mallampati: III  TM Distance: >3 FB Neck ROM: Full    Dental  (+) Dental Advisory Given, Teeth Intact   Pulmonary neg pulmonary ROS, neg shortness of breath, neg COPD, neg recent URI,    Pulmonary exam normal breath sounds clear to auscultation       Cardiovascular Exercise Tolerance: Poor hypertension, Pt. on medications (-) angina(-) Past MI, (-) Cardiac Stents, (-) CABG and (-) Orthopnea  Rhythm:Regular Rate:Normal     Neuro/Psych neg Seizures PSYCHIATRIC DISORDERS Anxiety Depression negative neurological ROS     GI/Hepatic Neg liver ROS, hiatal hernia, GERD  Medicated and Controlled,diverticulosis   Endo/Other  neg diabetesMorbid obesity  Renal/GU negative Renal ROS     Musculoskeletal  (+) Arthritis , Osteoarthritis,    Abdominal (+) + obese,   Peds  Hematology negative hematology ROS (+)   Anesthesia Other Findings HLD  Reproductive/Obstetrics                            Anesthesia Physical Anesthesia Plan  ASA: III  Anesthesia Plan: Regional and General   Post-op Pain Management:  Regional for Post-op pain   Induction: Intravenous  Airway Management Planned: Natural Airway, Nasal Cannula and Oral ETT  Additional Equipment:   Intra-op Plan:   Post-operative Plan: Extubation in OR  Informed Consent: I have reviewed the patients History and Physical, chart, labs and discussed the procedure including the risks, benefits and alternatives for the proposed anesthesia with the patient or authorized representative who has indicated his/her understanding and acceptance.   Dental advisory given  Plan  Discussed with:   Anesthesia Plan Comments: (Patient declined a spinal. She stated she has always had general anesthesia and would like to have GA this time as well. She does consent to a nerve block. Risks and benefits discussed.)       Anesthesia Quick Evaluation

## 2016-07-21 NOTE — Progress Notes (Signed)
Orthopedic Tech Progress Note Patient Details:  [name] [birth year] [id number]  CPM Right Knee CPM Right Knee: On Right Knee Flexion (Degrees): 90 Right Knee Extension (Degrees): 0 Additional Comments: trapeze bar patient hwelper   [name] 07/21/2016, 10:47 AM Viewed order from doctor's order list

## 2016-07-21 NOTE — Op Note (Signed)
[name], [name]             ACCOUNT NO.:  [id number]  MEDICAL RECORD NO.:  [id number]  LOCATION:  [facility]                         FACILITY:  [facility]  PHYSICIAN:  [name], M.D. DATE OF BIRTH:  Jul 29, 1946  DATE OF PROCEDURE:  07/21/2016 DATE OF DISCHARGE:                              OPERATIVE REPORT   PREOPERATIVE DIAGNOSIS:  Right knee end-stage osteoarthritis.  POSTOPERATIVE DIAGNOSIS:  Right knee end-stage osteoarthritis.  PROCEDURE PERFORMED:  Right total knee replacement using DePuy Sigma rotating platform prosthesis.  ATTENDING SURGEON:  [name], M.D.  ASSISTANT:  Abbott Pao. Dixon, PA-C, who scrubbed the entire procedure and necessary for satisfactory completion of surgery.  ANESTHESIA:  General anesthesia was used plus femoral block.  ESTIMATED BLOOD LOSS:  Minimal.  FLUID REPLACEMENT:  1500 mL of crystalloid.  INSTRUMENT COUNTS:  Correct.  COMPLICATIONS:  There were no complications.  ANTIBIOTICS:  Perioperative antibiotics were given.  INDICATIONS:  The patient is a 70 year old female with worsening right knee pain secondary to end-stage arthritis.  The patient has failed extensive period of conservative management and now presents with disabling pain and limitations in her function and activities of daily living.  The patient desires total knee arthroplasty to restore function and eliminate pain to her knee.  Informed consent obtained.  DESCRIPTION OF PROCEDURE:  After an adequate level of anesthesia was achieved, the patient was positioned in the supine position.  Right leg correctly identified.  A nonsterile tourniquet was placed in the proximal thigh.  Right leg sterilely prepped and draped in usual manner. Time-out called.  We elevated the leg and exsanguinated using Esmarch bandage after a sterile prep and drape.  We then elevated the tourniquet to 350 mmHg.  Longitudinal midline incision was created with the knee in flexion.   Dissection down through subcutaneous tissues.  We identified the patella and the peripatellar tissues.  We performed a medial parapatellar arthrotomy utilizing a fresh 10-blade scalpel.  We divided the lateral patellofemoral ligaments.  We subluxed the patella as we could not evert it.  The patient's knee was extremely synovitic with hypertrophic erythematous synovium.  We removed some of that, had better visualization.  We then went ahead and flexed the knee and performed removal of some large osteophytes.  We then entered the distal femur using a step-cut drill.  We placed our intramedullary resection guide resecting 10 mm of bone off the femur set on 5 degrees right.  We then sized the femur size 4 anterior down and performed our anterior, posterior, and chamfer cuts off the 4:1 block.  We then removed ACL and PCL meniscal tissue, subluxed the tibia anteriorly, and cut our tibia perpendicular to the long axis of the tibia, 2 degrees posterior slope for the MBT revision tray.  Once we had our cut done, we checked our gaps, they were little tight.  We went ahead and resected and I was resecting 2 mm off the affected lateral side.  We then went ahead, went back and resected 2 more mm and it gave [place] symmetrical gaps.  We went ahead and removed excess posterior osteophytes with the rongeur and osteotome.  Once we had the posterior aspect of the  knee cleaned out, we did go ahead and release the popliteus as that was excessively tight and the deep MCL.  We then at this point went ahead and completed our tibial preparation with modular drill and keel punch.  We placed our trial tibia in place, it was a size 3 tibia.  We then used our box cut guide to resect the box for the posterior cruciate substituting prosthesis and then impacted a 4 narrow femur in place.  We then reduced the knee with the 4 10 trial and had a nice pop on the medial side.  We placed the knee in extension.  We then went  ahead and resurfaced the patella going from 25 mm thickness down to about 15 and used a 35 patellar button.  We drilled the lug holes, placed the patellar button in place.  There was some lateral tilt and subluxation.  We went ahead and did a lateral release, which improved the tracking tremendously.  We then removed all trial components, irrigated the knee thoroughly, and cemented the components into place using DePuy high-viscosity cement.  Once the cement was hardened, we went ahead and removed excess cement using quarter-inch curved osteotome, checked to make sure the 10 was the appropriate fit, which it was.  We removed the trial 10 poly and we selected the real size 4 10 mm poly and placed that into the knee and then reduced the knee again with a nice pop.  We had good stability in flexion, extension, full extension obtained.  We then went ahead and the IV TXA was given and then at this point, we went ahead and irrigated the knee with pulse irrigator and then we started our retinacular repair with #1 Vicryl suture and then placed TXA in the knee topically as well. We completed our medial parapatellar arthrotomy repair with the #1 Vicryl suture interrupted in a figure-of-eight or simple sutures and then removed the TXA at the end.  Once we completed our repair, then we went to 2-0 Vicryl subcutaneous and 0-Vicryl and a layered subcutaneous closure and 4-0 Monocryl for skin.  Steri-Strips applied followed by sterile dressing.  The patient tolerated the surgery well.     [name], M.D.     SRN/MEDQ  D:  07/21/2016  T:  07/21/2016  Job:  [id number]

## 2016-07-21 NOTE — Evaluation (Signed)
Physical Therapy Evaluation Patient Details Name: [name] MRN: [id number] DOB: 18-Oct-1946 Today's Date: 07/21/2016   History of Present Illness  70 yo admitted for Rt TKA. PMHx: HTN, GERD, HLD, skin CA  Clinical Impression  Pt tired but willing to participate. Pt educated for no pillow under knee, KI use, HEP, transfers, function and progression. Pt with decreased strength, gait and activity tolerance who will benefit from acute therapy to maximize mobility, strength and function to return to PLOF. HEP provided    Follow Up Recommendations Home health PT    Equipment Recommendations  None recommended by PT    Recommendations for Other Services OT consult     Precautions / Restrictions Precautions Precautions: Knee Required Braces or Orthoses: Knee Immobilizer - Right Knee Immobilizer - Right: Other (comment) (not specified) Restrictions Weight Bearing Restrictions: Yes RLE Weight Bearing: Weight bearing as tolerated      Mobility  Bed Mobility Overal bed mobility: Needs Assistance Bed Mobility: Supine to Sit     Supine to sit: Min assist;HOB elevated     General bed mobility comments: HOb 20 degrees, use of rail and min assist to mobilize RLE. Pt was unable to utilize LLE to assist RLE at this time  Transfers Overall transfer level: Needs assistance   Transfers: Sit to/from Stand Sit to Stand: Min guard         General transfer comment: cues for hand placement and sequence  Ambulation/Gait Ambulation/Gait assistance: Min guard Ambulation Distance (Feet): 5 Feet Assistive device: Rolling walker (2 wheeled) Gait Pattern/deviations: Step-to pattern   Gait velocity interpretation: Below normal speed for age/gender General Gait Details: cues for sequence and safety  Stairs            Wheelchair Mobility    Modified Rankin (Stroke Patients Only)       Balance                                             Pertinent  Vitals/Pain Pain Assessment: 0-10 Pain Score: 2  Pain Location: right leg Pain Descriptors / Indicators: Tingling Pain Intervention(s): Premedicated before session;Monitored during session;Limited activity within patient's tolerance;Repositioned    Home Living Family/patient expects to be discharged to:: Private residence Living Arrangements: Spouse/significant other Available Help at Discharge: Family;Available 24 hours/day Type of Home: House Home Access: Stairs to enter   [employer] of Steps: 3 Home Layout: One level Home Equipment: Shower seat - built in;Walker - 2 wheels;Bedside commode;Grab bars - toilet      Prior Function Level of Independence: Independent               Hand Dominance        Extremity/Trunk Assessment   Upper Extremity Assessment: Overall WFL for tasks assessed           Lower Extremity Assessment: RLE deficits/detail RLE Deficits / Details: decreased ROM and strength post op    Cervical / Trunk Assessment: Other exceptions  Communication   Communication: No difficulties  Cognition Arousal/Alertness: Awake/alert Behavior During Therapy: WFL for tasks assessed/performed Overall Cognitive Status: Within Functional Limits for tasks assessed                      General Comments      Exercises Total Joint Exercises Ankle Circles/Pumps: AROM;Right;5 reps;Supine Quad Sets: AROM;Right;5 reps;Supine Heel Slides: AAROM;Right;5 reps;Supine  Assessment/Plan    PT Assessment Patient needs continued PT services  PT Diagnosis Difficulty walking;Acute pain   PT Problem List Decreased strength;Decreased range of motion;Decreased activity tolerance;Decreased knowledge of use of DME;Decreased mobility;Pain  PT Treatment Interventions Functional mobility training;Stair training;Gait training;Therapeutic exercise;Therapeutic activities;DME instruction;Patient/family education   PT Goals (Current goals can be found in  the Care Plan section) Acute Rehab PT Goals Patient Stated [facility] Goal Formulation: With patient/family Time For Goal Achievement: 07/28/16 Potential to Achieve Goals: Good    Frequency 7X/week   Barriers to discharge        Co-evaluation               End of Session Equipment Utilized During Treatment: Gait belt;Right knee immobilizer Activity Tolerance: Patient tolerated treatment well Patient left: in chair;with call bell/phone within reach;with family/visitor present;with chair alarm set Nurse Communication: Mobility status;Precautions;Weight bearing status         Time: 1310-1334 PT Time Calculation (min) (ACUTE ONLY): 24 min   Charges:   PT Evaluation $PT Eval Moderate Complexity: 1 Procedure PT Treatments $Gait Training: 8-22 mins   PT G CodesMelford Aase 07/21/2016, 1:52 PM Elwyn Reach, [place]

## 2016-07-22 LAB — BASIC METABOLIC PANEL
Anion gap: 8 (ref 5–15)
BUN: 8 mg/dL (ref 6–20)
CHLORIDE: 101 mmol/L (ref 101–111)
CO2: 24 mmol/L (ref 22–32)
Calcium: 8.4 mg/dL — ABNORMAL LOW (ref 8.9–10.3)
Creatinine, Ser: 0.73 mg/dL (ref 0.44–1.00)
GFR calc non Af Amer: >60 mL/min (ref >60)
Glucose, Bld: 120 mg/dL — ABNORMAL HIGH (ref 65–99)
POTASSIUM: 4.1 mmol/L (ref 3.5–5.1)
SODIUM: 133 mmol/L — AB (ref 135–145)

## 2016-07-22 LAB — CBC
HCT: 39.6 % (ref 36.0–46.0)
HEMOGLOBIN: 12.9 g/dL (ref 12.0–15.0)
MCH: 28.4 pg (ref 26.0–34.0)
MCHC: 32.6 g/dL (ref 30.0–36.0)
MCV: 87 fL (ref 78.0–100.0)
Platelets: 179 10*3/uL (ref 150–400)
RBC: 4.55 MIL/uL (ref 3.87–5.11)
RDW: 13 % (ref 11.5–15.5)
WBC: 9.8 10*3/uL (ref 4.0–10.5)

## 2016-07-22 LAB — PROTIME-INR
INR: 1.2
PROTHROMBIN TIME: 15.3 s — AB (ref 11.4–15.2)

## 2016-07-22 NOTE — Progress Notes (Signed)
Orthopedic Tech Progress Note Patient Details:  [name] [birth year] [id number]  CPM Right Knee CPM Right Knee: On Right Knee Flexion (Degrees): 90 Right Knee Extension (Degrees): 0 Additional Comments: trapeze bar patient helper   [place] Pink 07/22/2016, 1:09 PM

## 2016-07-22 NOTE — Evaluation (Signed)
Occupational Therapy Evaluation Patient Details Name: DIARRA GEITNER MRN: [id number] DOB: 1946/01/31 Today's Date: 07/22/2016    History of Present Illness 70 yo admitted for Rt TKA. PMHx: HTN, GERD, HLD, skin CA   Clinical Impression   Pt was performing self care at a modified independent level prior to admission. Presents with post operative pain, generalized weakness and poor standing balance interfering with ability to perform at her baseline. Will follow acutely.    Follow Up Recommendations  No OT follow up;Supervision/Assistance - 24 hour    Equipment Recommendations  None recommended by OT    Recommendations for Other Services       Precautions / Restrictions Precautions Precautions: Knee;Fall Required Braces or Orthoses: Knee Immobilizer - Right Knee Immobilizer - Right:  (not specified) Restrictions Weight Bearing Restrictions: Yes RLE Weight Bearing: Weight bearing as tolerated      Mobility Bed Mobility Overal bed mobility: Needs Assistance Bed Mobility: Supine to Sit     Supine to sit: Mod assist;HOB elevated     General bed mobility comments: assist to raise trunk and manage R LE  Transfers Overall transfer level: Needs assistance Equipment used: Rolling walker (2 wheeled) Transfers: Sit to/from [facility] Sit to Stand: Min guard;Min assist Stand pivot transfers: Min guard       General transfer comment: cues for hand placement and sequence, min to rise from bed, min guard from 3 in 1    Balance Overall balance assessment: Needs assistance Sitting-balance support: Feet supported Sitting balance-Leahy Scale: Fair       Standing balance-Leahy Scale: Poor Standing balance comment: heavy reliance on UEs                            ADL Overall ADL's : Needs assistance/impaired Eating/Feeding: Independent;Sitting   Grooming: Wash/dry hands;Wash/dry face;Sitting;Set up   Upper Body Bathing: Set up;Sitting    Lower Body Bathing: Maximal assistance;Sit to/from stand   Upper Body Dressing : Set up;Sitting   Lower Body Dressing: Maximal assistance;Sit to/from stand   Toilet Transfer: Min guard;Stand-pivot;BSC;RW   Toileting- Clothing Manipulation and Hygiene: Maximal assistance;Sit to/from stand         General ADL Comments: Showed pt AE, but did not practice, pt may rely on her husband to assist with LB bathing and dressing. Pt heavily reliant on UEs, unable to release walker in standing for pericare or to manage clothing.     Vision     Perception     Praxis      Pertinent Vitals/Pain Pain Assessment: Faces Faces Pain Scale: Hurts even more Pain Location: R knee Pain Descriptors / Indicators: Aching;Guarding;Grimacing Pain Intervention(s): Monitored during session;Premedicated before session;Repositioned;Ice applied     Hand Dominance Right   Extremity/Trunk Assessment Upper Extremity Assessment Upper Extremity Assessment: Overall WFL for tasks assessed   Lower Extremity Assessment Lower Extremity Assessment: Defer to PT evaluation       Communication Communication Communication: No difficulties   Cognition Arousal/Alertness: Awake/alert Behavior During Therapy: WFL for tasks assessed/performed Overall Cognitive Status: Within Functional Limits for tasks assessed                     General Comments       Exercises       Shoulder Instructions      Home Living Family/patient expects to be discharged to:: Private residence Living Arrangements: Spouse/significant other Available Help at Discharge: Family;Available 24 hours/day Type of  Home: House Home Access: Stairs to enter [employer] of Steps: 3   Home Layout: One level     Bathroom Shower/Tub: Tub/shower unit (garden and regular tub)   Biochemist, clinical:  (comfort height with grab bars)     Home Equipment: Environmental consultant - 2 wheels;Bedside commode;Grab bars - toilet;Walker - 4  wheels;Shower seat          Prior Functioning/Environment Level of Independence: Independent             OT Diagnosis: Generalized weakness;Acute pain   OT Problem List: Decreased strength;Decreased range of motion;Decreased activity tolerance;Impaired balance (sitting and/or standing);Decreased knowledge of use of DME or AE;Obesity;Pain   OT Treatment/Interventions: Self-care/ADL training;DME and/or AE instruction;Patient/family education;Balance training;Therapeutic activities    OT Goals(Current goals can be found in the care plan section) Acute Rehab OT Goals Patient Stated Goal[facility] Goal Formulation: With patient Time For Goal Achievement: 07/29/16 Potential to Achieve Goals: Good ADL Goals Pt Will Perform Grooming: with supervision;standing Pt Will Transfer to Toilet: with supervision;ambulating;bedside commode (BSC over toilet vs comfort height and grab bars) Pt Will Perform Toileting - Clothing Manipulation and hygiene: with supervision;sit to/from stand Additional ADL Goal #1: Pt will be knowledgeable in use and availability of AE for LB ADL. Additional ADL Goal #2: Pt will be aware of availability of tub transfer bench (currently has a tub seat).  OT Frequency: Min 2X/week   Barriers to D/C:            Co-evaluation              End of Session Equipment Utilized During Treatment: Gait belt;Rolling walker;Right knee immobilizer Nurse Communication: Mobility status  Activity Tolerance: Patient tolerated treatment well Patient left: in chair;with call bell/phone within reach;with chair alarm set;with family/visitor present   Time: [id number] OT Time Calculation (min): 34 min Charges:  OT General Charges $OT Visit: 1 Procedure OT Evaluation $OT Eval Moderate Complexity: 1 Procedure OT Treatments $Self Care/Home Management : 8-22 mins G-Codes:    Malka So 07/22/2016, 10:01 AM  [phone]

## 2016-07-22 NOTE — Progress Notes (Signed)
Physical Therapy Treatment Patient Details Name: [name] MRN: [id number] DOB: 23-Apr-1946 Today's Date: 07/22/2016    History of Present Illness 70 yo admitted for Rt TKA. PMHx: HTN, GERD, HLD, skin CA    PT Comments    Pt performed increased gait   Follow Up Recommendations        Equipment Recommendations  None recommended by PT    Recommendations for Other Services       Precautions / Restrictions Precautions Precautions: Knee;Fall Required Braces or Orthoses: Knee Immobilizer - Right Knee Immobilizer - Right:  (not specified.  ) Restrictions Weight Bearing Restrictions: Yes RLE Weight Bearing: Weight bearing as tolerated    Mobility  Bed Mobility Overal bed mobility: Needs Assistance Bed Mobility: Supine to Sit     Supine to sit: HOB elevated;Min assist     General bed mobility comments: assist to raise trunk and manage R LE, required increased time and multiple techniques.  Pain limits progression.  Transfers Overall transfer level: Needs assistance Equipment used: Rolling walker (2 wheeled) Transfers: Sit to/from Stand Sit to Stand: Min assist Stand pivot transfers: Min guard       General transfer comment: cues for hand placement and sequence, min to rise from bed.  Required to attempts to complete successfully.    Ambulation/Gait Ambulation/Gait assistance: Min assist Ambulation Distance (Feet): 52 Feet Assistive device: Rolling walker (2 wheeled) Gait Pattern/deviations: Step-to pattern;Trunk flexed;Decreased stance time - right;Decreased step length - left;Shuffle   Gait velocity interpretation: Below normal speed for age/gender General Gait Details: cues for sequence and safety and gait symmetry.     Stairs            Wheelchair Mobility    Modified Rankin (Stroke Patients Only)       Balance Overall balance assessment: Needs assistance Sitting-balance support: Feet supported Sitting balance-Leahy Scale: Good       Standing balance-Leahy Scale: Fair Standing balance comment: heavy reliance on UEs                    Cognition Arousal/Alertness: Awake/alert Behavior During Therapy: WFL for tasks assessed/performed Overall Cognitive Status: Within Functional Limits for tasks assessed                      Exercises Total Joint Exercises Ankle Circles/Pumps: AROM;Right;Supine;10 reps Quad Sets: AROM;Right;Supine;10 reps Heel Slides: Right;10 reps;Supine;AAROM Hip ABduction/ADduction: AAROM;Right;10 reps;Supine Straight Leg Raises: AAROM;Right;10 reps;Supine Goniometric ROM: 70 degrees flexion in R knee.      General Comments        Pertinent Vitals/Pain Pain Assessment: 0-10 Pain Score: 7  Faces Pain Scale: Hurts even more Pain Location: R knee Pain Descriptors / Indicators: Aching;Guarding;Grimacing Pain Intervention(s): Monitored during session;Repositioned    Home Living Family/patient expects to be discharged to:: Private residence Living Arrangements: Spouse/significant other Available Help at Discharge: Family;Available 24 hours/day Type of Home: House Home Access: Stairs to enter   Home Layout: One level Home Equipment: Environmental consultant - 2 wheels;Bedside commode;Grab bars - toilet;Walker - 4 wheels;Shower seat      Prior Function Level of Independence: Independent          PT Goals (current goals can now be found in the care plan section) Acute Rehab PT Goals Patient Stated [facility]: [facility] Potential to Achieve Goals: Good Progress towards PT goals: Progressing toward goals    Frequency  7X/week    PT Plan Current plan remains appropriate    Co-evaluation  End of Session Equipment Utilized During Treatment: Gait belt;Right knee immobilizer Activity Tolerance: Patient tolerated treatment well Patient left: with call bell/phone within reach;with family/visitor present;in bed     Time: [id number] PT Time Calculation (min) (ACUTE  ONLY): 37 min  Charges:  $Gait Training: 8-22 mins $Therapeutic Exercise: 8-22 mins                    G Codes:      Cristela Blue 20-Aug-2016, 11:40 AM   Governor Rooks, PTA pager [phone]

## 2016-07-22 NOTE — Progress Notes (Signed)
ANTICOAGULATION CONSULT NOTE - Follow Up Consult  Pharmacy Consult for Coumadin Indication: VTE prophylaxis  Allergies  Allergen Reactions  . Statins Swelling and Other (See Comments)    MYALGIAS SWELLING OF LEGS  . Losartan     sick  . Penicillins Rash    Has patient had a PCN reaction causing immediate rash, facial/tongue/throat swelling, SOB or lightheadedness with hypotension: no Has patient had a PCN reaction causing severe rash involving mucus membranes or skin necrosis: no Has patient had a PCN reaction that required hospitalization no Has patient had a PCN reaction occurring within the last 10 years: no If all of the above answers are "NO", then may proceed with Cephalosporin use.   Patient Measurements: Weight: 250 lb (113.4 kg) Vital Signs: Temp: 98.6 F (37 C) (08/12 0453) Temp Source: Oral (08/12 0453) BP: 126/51 (08/12 0453) Pulse Rate: 76 (08/12 0453) Labs:  Recent Labs  07/21/16 1226 07/22/16 0645  HGB 13.6 12.9  HCT 41.0 39.6  PLT 211 179  LABPROT  --  15.3*  INR  --  1.20  CREATININE 0.86 0.73    Estimated Creatinine Clearance: 84.8 mL/min (by C-G formula based on SCr of 0.8 mg/dL).  Assessment: 70 year old female on Coumadin for VTE prophylaxis s/p TKA.   INR today is 1.20 after 7.5mg  po x1 last night.  CBC is within normal limits and stable.  No bleeding reported.   Goal of Therapy:  INR 2-3 Monitor platelets by anticoagulation protocol: Yes   Plan:  Continue Coumadin 7.5mg  po daily.  Daily PT/INR.   Sloan Leiter, PharmD, BCPS Clinical Pharmacist [phone] 07/22/2016,9:27 AM

## 2016-07-22 NOTE — Progress Notes (Signed)
Orthopedics Progress Note  Subjective: Patient reports pain in the right knee but feels she is doing well with therapy and the CPM Objective:  Vitals:   07/22/16 0453 07/22/16 1317  BP: (!) 126/51 (!) 121/56  Pulse: 76 72  Resp: 16 17  Temp: 98.6 F (37 C) 99.1 F (37.3 C)    General: Awake and alert  Musculoskeletal: right knee in CPM 0-60 with pain controlled.  No pain with AROM at the ankle and calf pumps Neurovascularly intact  Lab Results  Component Value Date   WBC 9.8 07/22/2016   HGB 12.9 07/22/2016   HCT 39.6 07/22/2016   MCV 87.0 07/22/2016   PLT 179 07/22/2016       Component Value Date/Time   NA 133 (L) 07/22/2016 0645   K 4.1 07/22/2016 0645   CL 101 07/22/2016 0645   CO2 24 07/22/2016 0645   GLUCOSE 120 (H) 07/22/2016 0645   BUN 8 07/22/2016 0645   CREATININE 0.73 07/22/2016 0645   CALCIUM 8.4 (L) 07/22/2016 0645   GFRNONAA >60 07/22/2016 0645   GFRAA >60 07/22/2016 0645    Lab Results  Component Value Date   INR 1.20 07/22/2016    Assessment/Plan: POD #1 s/p Procedure(s): RIGHT TOTAL KNEE ARTHROPLASTY Stable overnight.  Continue PT, OT, D/C planning DVT prophlaxis with mechanical and Coumadin/Lovenox bridge  [name]. Kristina Fells, MD 07/22/2016 1:49 PM

## 2016-07-22 NOTE — Progress Notes (Signed)
Physical Therapy Treatment Patient Details Name: RIYLEE SOULLIERE MRN: [id number] DOB: [birth year] Today's Date: 07/22/2016    History of Present Illness 70 yo admitted for Rt TKA. PMHx: HTN, GERD, HLD, skin CA    PT Comments    Pt performed increased mobility, including transfer training, increased gait distance and supine therapeutic exercises.  Pt will try stairs in am before d/c home.    Follow Up Recommendations  Home health PT     Equipment Recommendations  None recommended by PT    Recommendations for Other Services       Precautions / Restrictions Precautions Precautions: Knee;Fall Required Braces or Orthoses: Knee Immobilizer - Right Knee Immobilizer - Right:  (not specified.  ) Restrictions Weight Bearing Restrictions: Yes RLE Weight Bearing: Weight bearing as tolerated    Mobility  Bed Mobility Overal bed mobility: Needs Assistance Bed Mobility: Supine to Sit     Supine to sit: HOB elevated;Min assist     General bed mobility comments: assist to raise trunk and manage R LE, required increased time and multiple techniques.  Pain limits progression.  Transfers Overall transfer level: Needs assistance Equipment used: Rolling walker (2 wheeled) Transfers: Sit to/from Stand Sit to Stand: Min assist         General transfer comment: cues for hand placement and sequence, min to rise from bed.  Required to attempts to complete successfully.    Ambulation/Gait   Ambulation Distance (Feet): 18 Feet (+62 ft.  ) Assistive device: Rolling walker (2 wheeled) Gait Pattern/deviations: Step-to pattern;Trunk flexed;Decreased stance time - right;Decreased step length - left;Antalgic   Gait velocity interpretation: Below normal speed for age/gender General Gait Details: Cues for sequencing and safety and gait symmetry.     Stairs            Wheelchair Mobility    Modified Rankin (Stroke Patients Only)       Balance     Sitting balance-Leahy  Scale: Good       Standing balance-Leahy Scale: Fair                      Cognition Arousal/Alertness: Awake/alert Behavior During Therapy: WFL for tasks assessed/performed Overall Cognitive Status: Within Functional Limits for tasks assessed                      Exercises Total Joint Exercises Ankle Circles/Pumps: AROM;Right;Supine;10 reps Quad Sets: AROM;Right;Supine;10 reps Short Arc QuadSinclair Ship;Right;10 reps;Supine Heel Slides: Right;10 reps;Supine;AAROM Hip ABduction/ADduction: AAROM;Right;10 reps;Supine Straight Leg Raises: AAROM;Right;10 reps;Supine    General Comments        Pertinent Vitals/Pain Pain Assessment: 0-10 Pain Score: 8  Pain Location: R knee Pain Descriptors / Indicators: Aching;Guarding;Grimacing Pain Intervention(s): Limited activity within patient's tolerance;Patient requesting pain meds-RN notified;Repositioned;Ice applied    Home Living                      Prior Function            PT Goals (current goals can now be found in the care plan section) Acute Rehab PT Goals Patient Stated Go[facility] Goals: Good Progress towards PT goals: Progressing toward goals    Frequency  7X/week    PT Plan Current plan remains appropriate    Co-evaluation             End of Session Equipment Utilized During Treatment: Gait belt Activity Tolerance: Patient tolerated treatment  well Patient left: with call bell/phone within reach;with family/visitor present;in bed     Time: [id number] PT Time Calculation (min) (ACUTE ONLY): 41 min  Charges:  $Gait Training: 8-22 mins $Therapeutic Exercise: 8-22 mins $Therapeutic Activity: 8-22 mins                    G Codes:      Cristela Blue 07/31/16, 5:00 PM  Governor Rooks, PTA pager [phone]

## 2016-07-23 LAB — PROTIME-INR
INR: 1.25
Prothrombin Time: 15.8 seconds — ABNORMAL HIGH (ref 11.4–15.2)

## 2016-07-23 LAB — CBC
HCT: 41.3 % (ref 36.0–46.0)
Hemoglobin: 13.5 g/dL (ref 12.0–15.0)
MCH: 28.8 pg (ref 26.0–34.0)
MCHC: 32.7 g/dL (ref 30.0–36.0)
MCV: 88.2 fL (ref 78.0–100.0)
PLATELETS: 195 10*3/uL (ref 150–400)
RBC: 4.68 MIL/uL (ref 3.87–5.11)
RDW: 13.1 % (ref 11.5–15.5)
WBC: 13.4 10*3/uL — AB (ref 4.0–10.5)

## 2016-07-23 MED ORDER — WARFARIN SODIUM 5 MG PO TABS
10.0000 mg | ORAL_TABLET | Freq: Every day | ORAL | Status: DC
  Administered 2016-07-23: 10 mg via ORAL
  Filled 2016-07-23: qty 2

## 2016-07-23 NOTE — Progress Notes (Signed)
Occupational Therapy Treatment Patient Details Name: [name] MRN: [id number] DOB: 23-Jun-1946 Today's Date: 07/23/2016    History of present illness 70 yo admitted for Rt TKA. PMHx: HTN, GERD, HLD, skin CA   OT comments  Pt. Seen for review and education regarding A/E and DME options for ADL completion.  Pt. Reports husband and son to assist with LB ADLS, and will look into purchase of tub bench or will sponge bathe initially until she feels strong enough to perform side step over tub.  States she has no other questions.  Will alert OTR/l to sign off.    Follow Up Recommendations  No OT follow up;Supervision/Assistance - 24 hour    Equipment Recommendations  Tub/shower bench    Recommendations for Other Services      Precautions / Restrictions Precautions Precautions: Knee;Fall Precaution Comments: reviewed nothing under knee Required Braces or Orthoses: Knee Immobilizer - Right Restrictions Weight Bearing Restrictions: Yes RLE Weight Bearing: Weight bearing as tolerated       Mobility Bed Mobility               General bed mobility comments: in bathroom with RN upon arrival  Transfers Overall transfer level: Needs assistance Equipment used: Rolling walker (2 wheeled) Transfers: Sit to/from Stand Sit to Stand: Min guard;Supervision         General transfer comment: supervision to stand and min guard to descend; safe hand placement    Balance     Sitting balance-Leahy Scale: Good       Standing balance-Leahy Scale: Fair                     ADL Overall ADL's : Needs assistance/impaired               Lower Body Bathing Details (indicate cue type and reason): pt. declines need for A/E reports husband and son who is a CNA are available to assist as needed       Lower Body Dressing Details (indicate cue type and reason): pt. declines need for A/E reports husband and son who is a CNA are available to assist as needed   Toilet  Transfer Details (indicate cue type and reason): reports she has been amb. to/from b.room declined need for review with multiple attempts from therapist asst.       Tub/Shower Transfer Details (indicate cue type and reason): provided demo of transfer options: chair, bench. pt. states she feels uncomfortable attempting side step over tub and will either sponge bathe until strong enough or purchase a tub bench.     General ADL Comments: pt. declined physical review of any OT stated goals.  reports no issues with toileting, states husband and son will assist with LB ADLS. and declined tub transfers due to not feeling strong enough.  reports she will sponge bathe or purchase tub bench until strong enough to attempt transfer      Vision                     Perception     Praxis      Cognition   Behavior During Therapy: [facility] for tasks assessed/performed Overall Cognitive Status: Within Functional Limits for tasks assessed                       Extremity/Trunk Assessment               Exercises Total Joint Exercises Quad Sets: AROM;Both;10  reps;Seated Short Arc QuadSinclair Ship;Right;10 reps;Seated Heel Slides: AAROM;Right;10 reps;Seated Hip ABduction/ADduction: Right;AAROM;10 reps;Seated Goniometric ROM: ~80 flexion   Shoulder Instructions       General Comments      Pertinent Vitals/ Pain       Pain Assessment: No/denies pain Pain Score: 4  Pain Location: R knee Pain Descriptors / Indicators: Aching;Guarding;Sore Pain Intervention(s): Limited activity within patient's tolerance;Monitored during session;Premedicated before session;Repositioned;Ice applied  Home Living                                          Prior Functioning/Environment              Frequency Min 2X/week     Progress Toward Goals  OT Goals(current goals can now be found in the care plan section)  Progress towards OT goals: Goals met/education completed, patient  discharged from OT  Acute Rehab OT Goals Patient Stated Goal: go home  Plan Discharge plan remains appropriate    Co-evaluation                 End of Session     Activity Tolerance Patient tolerated treatment well   Patient Left in chair;with call bell/phone within reach   Nurse Communication          Time: 1030-1040 OT Time Calculation (min): 10 min  Charges: OT General Charges $OT Visit: 1 Procedure OT Treatments $Self Care/Home Management : 8-22 mins  Janice Coffin, COTA/L 07/23/2016, 10:47 AM

## 2016-07-23 NOTE — Progress Notes (Signed)
Orthopedics Progress Note  Subjective: Patient states that she has not done stairs yet and is not sure if she can go home today.  Objective:  Vitals:   07/22/16 1941 07/23/16 0616  BP: (!) 138/58 (!) 133/58  Pulse: 84 87  Resp: 16 16  Temp: 99.2 F (37.3 C) 98.8 F (37.1 C)    General: Awake and alert  Musculoskeletal: Right knee dressing changed to Mepilex.  Min to moderate swelling.  No pain with AROM and ankle pumps Neurovascularly intact  Lab Results  Component Value Date   WBC 13.4 (H) 07/23/2016   HGB 13.5 07/23/2016   HCT 41.3 07/23/2016   MCV 88.2 07/23/2016   PLT 195 07/23/2016       Component Value Date/Time   NA 133 (L) 07/22/2016 0645   K 4.1 07/22/2016 0645   CL 101 07/22/2016 0645   CO2 24 07/22/2016 0645   GLUCOSE 120 (H) 07/22/2016 0645   BUN 8 07/22/2016 0645   CREATININE 0.73 07/22/2016 0645   CALCIUM 8.4 (L) 07/22/2016 0645   GFRNONAA >60 07/22/2016 0645   GFRAA >60 07/22/2016 0645    Lab Results  Component Value Date   INR 1.25 07/23/2016   INR 1.20 07/22/2016    Assessment/Plan: POD #2 s/p Procedure(s): RIGHT TOTAL KNEE ARTHROPLASTY Possible D/C this afternoon if cleared by therapy. Will need home health PT, OT, and RN for Coumadin therapy.  Will also need Lovenox bridge as PT/INR not in proper range yet. Likely D/C tomorrow  Doran Heater. Veverly Fells, MD 07/23/2016 7:21 AM

## 2016-07-23 NOTE — Discharge Summary (Signed)
Physician Discharge Summary   [name] MRN: [id number] DOB/AGE: 07/07/1946 70 y.o.  Admit date: 07/21/2016 Discharge date: 07/23/2016  Admission Diagnoses:  Active Problems:   H/O total knee replacement   Discharge Diagnoses:  Same   Surgeries: Procedure(s): RIGHT TOTAL KNEE ARTHROPLASTY on 07/21/2016   Consultants: PT, OT, D/C planning  Discharged Condition: Stable  Hospital Course: [name] is an 70 y.o. 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  They were brought to the operating room on 07/21/2016 and underwent the above named procedures.    The patient had an uncomplicated hospital course and was stable for discharge.  Recent vital signs:  Vitals:   07/22/16 1941 07/23/16 0616  BP: (!) 138/58 (!) 133/58  Pulse: 84 87  Resp: 16 16  Temp: 99.2 F (37.3 C) 98.8 F (37.1 C)    Recent laboratory studies:  Results for orders placed or performed during the hospital encounter of 07/21/16  CBC  Result Value Ref Range   WBC 17.3 (H) 4.0 - 10.5 K/uL   RBC 4.71 3.87 - 5.11 MIL/uL   Hemoglobin 13.6 12.0 - 15.0 g/dL   HCT 41.0 36.0 - 46.0 %   MCV 87.0 78.0 - 100.0 fL   MCH 28.9 26.0 - 34.0 pg   MCHC 33.2 30.0 - 36.0 g/dL   RDW 12.6 11.5 - 15.5 %   Platelets 211 150 - 400 K/uL  Creatinine, serum  Result Value Ref Range   Creatinine, Ser 0.86 0.44 - 1.00 mg/dL   GFR calc non Af Amer >60 >60 mL/min   GFR calc Af Amer >60 >60 mL/min  Protime-INR  Result Value Ref Range   Prothrombin Time 15.3 (H) 11.4 - 15.2 seconds   INR 1.20   CBC  Result Value Ref Range   WBC 9.8 4.0 - 10.5 K/uL   RBC 4.55 3.87 - 5.11 MIL/uL   Hemoglobin 12.9 12.0 - 15.0 g/dL   HCT 39.6 36.0 - 46.0 %   MCV 87.0 78.0 - 100.0 fL   MCH 28.4 26.0 - 34.0 pg   MCHC 32.6 30.0 - 36.0 g/dL   RDW 13.0 11.5 - 15.5 %   Platelets 179 150 - 400 K/uL  Basic metabolic panel  Result  Value Ref Range   Sodium 133 (L) 135 - 145 mmol/L   Potassium 4.1 3.5 - 5.1 mmol/L   Chloride 101 101 - 111 mmol/L   CO2 24 22 - 32 mmol/L   Glucose, Bld 120 (H) 65 - 99 mg/dL   BUN 8 6 - 20 mg/dL   Creatinine, Ser 0.73 0.44 - 1.00 mg/dL   Calcium 8.4 (L) 8.9 - 10.3 mg/dL   GFR calc non Af Amer >60 >60 mL/min   GFR calc Af Amer >60 >60 mL/min   Anion gap 8 5 - 15  Protime-INR  Result Value Ref Range   Prothrombin Time 15.8 (H) 11.4 - 15.2 seconds   INR 1.25   CBC  Result Value Ref Range   WBC 13.4 (H) 4.0 - 10.5 K/uL   RBC 4.68 3.87 - 5.11 MIL/uL   Hemoglobin 13.5 12.0 - 15.0 g/dL   HCT 41.3 36.0 - 46.0 %   MCV 88.2 78.0 - 100.0 fL   MCH 28.8 26.0 - 34.0 pg   MCHC 32.7 30.0 - 36.0 g/dL   RDW 13.1 11.5 - 15.5 %  Platelets 195 150 - 400 K/uL    Discharge Medications:     Medication List    TAKE these medications   ALLERGY 25 mg capsule Generic drug:  diphenhydrAMINE Take 25 mg by mouth every 6 (six) hours as needed.   amLODipine 10 MG tablet Commonly known as:  NORVASC Take 10 mg by mouth daily.   aspirin EC 81 MG tablet Take 81 mg by mouth daily.   esomeprazole 40 MG capsule Commonly known as:  NEXIUM Take 40 mg by mouth daily as needed.   FLAXSEED (LINSEED) PO Take 1,000 mg by mouth daily.   glucosamine-chondroitin 500-400 MG tablet Take 1 tablet by mouth 2 (two) times daily.   KRILL OIL PO Take 2 capsules by mouth daily.   LIVERITE PO Take 2 tablets by mouth at bedtime.   methocarbamol 500 MG tablet Commonly known as:  ROBAXIN Take 1 tablet (500 mg total) by mouth 3 (three) times daily as needed.   OVER THE COUNTER MEDICATION Take 1 tablet by mouth daily. 1000mg  milk thissel   oxyCODONE-acetaminophen 5-325 MG tablet Commonly known as:  ROXICET Take 1-2 tablets by mouth every 4 (four) hours as needed for severe pain.   quinapril 40 MG tablet Commonly known as:  ACCUPRIL Take 1 tablet (40 mg total) by mouth at bedtime.   Turmeric Curcumin  500 MG Caps Take 1 capsule by mouth daily.   vitamin B-12 1000 MCG tablet Commonly known as:  CYANOCOBALAMIN Take 1,000 mcg by mouth daily.   vitamin C 500 MG tablet Commonly known as:  ASCORBIC ACID Take 500 mg by mouth daily.   Vitamin D-3 1000 units Caps Take 2,000 Units by mouth daily.   warfarin 5 MG tablet Commonly known as:  COUMADIN Take 1 tablet (5 mg total) by mouth daily. Take as directed per the pharmacist for INR target from 2.5-3.0 for 30 days post op       Diagnostic Studies: No results found.  Disposition: home with home health PT, OT, RN and DME  Discharge Instructions    Ambulatory Referral for DME    Complete by:  As directed   Per OT, PT patient needs a shower bench      Follow-up Information    Matsue Strom,STEVEN R, MD. Call in 2 weeks.   Specialty:  Orthopedic Surgery Why:  [id number] Contact information: [address] [place] [address] [phone]            Signed: Augustin Schooling 07/23/2016, 12:43 PM

## 2016-07-23 NOTE — Progress Notes (Addendum)
ANTICOAGULATION CONSULT NOTE - Follow Up Consult  Pharmacy Consult for Coumadin Indication: VTE prophylaxis  Allergies  Allergen Reactions  . Statins Swelling and Other (See Comments)    MYALGIAS SWELLING OF LEGS  . Losartan     sick  . Penicillins Rash    Has patient had a PCN reaction causing immediate rash, facial/tongue/throat swelling, SOB or lightheadedness with hypotension: no Has patient had a PCN reaction causing severe rash involving mucus membranes or skin necrosis: no Has patient had a PCN reaction that required hospitalization no Has patient had a PCN reaction occurring within the last 10 years: no If all of the above answers are "NO", then may proceed with Cephalosporin use.   Patient Measurements: Weight: 250 lb (113.4 kg) Vital Signs: Temp: 98.8 F (37.1 C) (08/13 0616) Temp Source: Oral (08/13 0616) BP: 133/58 (08/13 0616) Pulse Rate: 87 (08/13 0616) Labs:  Recent Labs  07/21/16 1226 07/22/16 0645 07/23/16 0549  HGB 13.6 12.9 13.5  HCT 41.0 39.6 41.3  PLT 211 179 195  LABPROT  --  15.3* 15.8*  INR  --  1.20 1.25  CREATININE 0.86 0.73  --     Estimated Creatinine Clearance: 84.8 mL/min (by C-G formula based on SCr of 0.8 mg/dL).  Assessment: 70 year old female on Coumadin for VTE prophylaxis s/p TKA.   INR today is 1.25 after 7.5mg  po daily x 2 days.  CBC is within normal limits and stable.  No bleeding reported.  Due to lack of increase on 7.5mg  daily will increase to 10 mg daily.   Goal of Therapy:  INR 2-3 Monitor platelets by anticoagulation protocol: Yes   Plan:  Increase Coumadin 10 mg po daily.  If to discharge home, recommend 10 mg po daily with INR check within the week.  Daily PT/INR.   Sloan Leiter, PharmD, BCPS Clinical Pharmacist [phone] 07/23/2016,11:28 AM

## 2016-07-23 NOTE — Progress Notes (Signed)
Physical Therapy Treatment Patient Details Name: [name] MRN: [id number] DOB: [birth year] Today's Date: 07/23/2016    History of Present Illness 70 yo admitted for Rt TKA. PMHx: HTN, GERD, HLD, skin CA    PT Comments    Patient is progressing toward mobility goals. Tolerated gait/stair training and therex. Limited by feeling "whoozy". Continue to progress as tolerated with anticipated d/c home with HHPT.   Follow Up Recommendations  Home health PT     Equipment Recommendations  None recommended by PT    Recommendations for Other Services OT consult     Precautions / Restrictions Precautions Precautions: Knee;Fall Precaution Comments: reviewed nothing under knee Restrictions Weight Bearing Restrictions: Yes RLE Weight Bearing: Weight bearing as tolerated    Mobility  Bed Mobility               General bed mobility comments: in bathroom with RN upon arrival  Transfers Overall transfer level: Needs assistance Equipment used: Rolling walker (2 wheeled) Transfers: Sit to/from Stand Sit to Stand: Min guard;Supervision         General transfer comment: supervision to stand and min guard to descend; safe hand placement  Ambulation/Gait Ambulation/Gait assistance: Supervision Ambulation Distance (Feet): 100 Feet (60, 40) Assistive device: Rolling walker (2 wheeled) Gait Pattern/deviations: Step-through pattern;Decreased stance time - right;Decreased step length - left;Decreased stride length;Wide base of support     General Gait Details: cues for sequencing, increased flexion during swing phase, and increased WB on R LE   Stairs Stairs: Yes Stairs assistance: Min guard Stair Management: Two rails;Forwards Number of Stairs: 2 General stair comments: educated pt on sequencing and technique; good safety awareness; no knee buckling  Wheelchair Mobility    Modified Rankin (Stroke Patients Only)       Balance     Sitting balance-Leahy Scale:  Good       Standing balance-Leahy Scale: Fair                      Cognition Arousal/Alertness: Awake/alert Behavior During Therapy: WFL for tasks assessed/performed Overall Cognitive Status: Within Functional Limits for tasks assessed                      Exercises Total Joint Exercises Quad Sets: AROM;Both;10 reps;Seated Short Arc Quad: AAROM;Right;10 reps;Seated Heel Slides: AAROM;Right;10 reps;Seated Hip ABduction/ADduction: Right;AAROM;10 reps;Seated Goniometric ROM: ~80 flexion    General Comments        Pertinent Vitals/Pain Pain Assessment: 0-10 Pain Score: 4  Pain Location: R knee Pain Descriptors / Indicators: Aching;Guarding;Sore Pain Intervention(s): Limited activity within patient's tolerance;Monitored during session;Premedicated before session;Repositioned;Ice applied    Home Living                      Prior Function            PT Goals (current goals can now be found in the care plan section) Acute Rehab PT Goals Patient Stated Goal: go home Progress towards PT goals: Progressing toward goals    Frequency  7X/week    PT Plan Current plan remains appropriate    Co-evaluation             End of Session Equipment Utilized During Treatment: Gait belt Activity Tolerance: Patient tolerated treatment well Patient left: in chair;with call bell/phone within reach     Time: 0920-0958 PT Time Calculation (min) (ACUTE ONLY): 38 min  Charges:  $Gait Training: 8-22 mins $Therapeutic Exercise: 8-22  mins $Therapeutic Activity: 8-22 mins                    G Codes:      Salina April, PTA Pager: [phone]   07/23/2016, 10:25 AM

## 2016-07-23 NOTE — Progress Notes (Signed)
Physical Therapy Treatment Patient Details Name: [name] MRN: [id number] DOB: 1946/11/26 Today's Date: 07/23/2016    History of Present Illness 70 yo admitted for Rt TKA. PMHx: HTN, GERD, HLD, skin CA    PT Comments    Patient continues to do well with PT. A little "groggy" from medications however moving well. Pt will need some assist to perform HEP and elevate R LE into/out of bed and reported that husband, son, and niece will be available to assist her at home. Current plan remains appropriate.   Follow Up Recommendations  Home health PT     Equipment Recommendations  None recommended by PT    Recommendations for Other Services OT consult     Precautions / Restrictions Precautions Precautions: Knee;Fall Precaution Comments: reviewed nothing under knee Required Braces or Orthoses: Knee Immobilizer - Right Restrictions Weight Bearing Restrictions: Yes RLE Weight Bearing: Weight bearing as tolerated    Mobility  Bed Mobility Overal bed mobility: Needs Assistance Bed Mobility: Sit to Supine       Sit to supine: Min assist   General bed mobility comments: up in chair upon arrival; cues for technique; assist to elevate R LE into bed  Transfers Overall transfer level: Needs assistance Equipment used: Rolling walker (2 wheeled) Transfers: Sit to/from Stand Sit to Stand: Min guard;Supervision         General transfer comment: supervision to stand and min guard to descend; safe hand placement  Ambulation/Gait Ambulation/Gait assistance: Supervision Ambulation Distance (Feet): 190 Feet Assistive device: Rolling walker (2 wheeled) Gait Pattern/deviations: Step-through pattern;Decreased stance time - right;Decreased stride length;Decreased weight shift to right;Wide base of support     General Gait Details: cues for posture, increased R knee flexion with swing phase, and bilat step symmetry   Stairs Stairs: Yes Stairs assistance: Min guard Stair  Management: Two rails;Forwards Number of Stairs: 2 General stair comments: educated pt on sequencing and technique; good safety awareness; no knee buckling  Wheelchair Mobility    Modified Rankin (Stroke Patients Only)       Balance     Sitting balance-Leahy Scale: Good       Standing balance-Leahy Scale: Fair                      Cognition Arousal/Alertness: Awake/alert Behavior During Therapy: WFL for tasks assessed/performed Overall Cognitive Status: Within Functional Limits for tasks assessed                      Exercises Total Joint Exercises Quad Sets: AROM;Both;10 reps;Seated Short Arc Quad: AAROM;Right;10 reps;Seated Heel Slides: AAROM;Right;10 reps;Seated Hip ABduction/ADduction: Right;AAROM;10 reps;Seated Straight Leg Raises: AAROM;Right;10 reps;Seated Long Arc Quad: AAROM;Right;10 reps;Seated Knee Flexion: AROM;Right;5 reps;Seated (10 second hold) Goniometric ROM: ~80 flexion    General Comments        Pertinent Vitals/Pain Pain Assessment: Faces Pain Score: 4  Faces Pain Scale: Hurts little more Pain Location: R knee with therex Pain Descriptors / Indicators: Grimacing;Sore Pain Intervention(s): Limited activity within patient's tolerance;Monitored during session;Premedicated before session;Repositioned    Home Living                      Prior Function            PT Goals (current goals can now be found in the care plan section) Acute Rehab PT Goals Patient Stated Goal: go home Progress towards PT goals: Progressing toward goals    Frequency  7X/week  PT Plan Current plan remains appropriate    Co-evaluation             End of Session Equipment Utilized During Treatment: Gait belt Activity Tolerance: Patient tolerated treatment well Patient left: with call bell/phone within reach;in bed     Time: 1136-1207 PT Time Calculation (min) (ACUTE ONLY): 31 min  Charges:  $Gait Training: 8-22  mins $Therapeutic Exercise: 8-22 mins                     G Codes:      Salina April, PTA Pager: [phone]   07/23/2016, 12:23 PM

## 2016-07-23 NOTE — Progress Notes (Signed)
Orthopedic Tech Progress Note Patient Details:  [name] 1947-10-70 [id number]  [name], female   DOB: 1945-12-29, 70 y.o.   MRN: [id number] Applied cpm 0-60  Karolee Stamps 07/23/2016, 6:10 AM

## 2016-07-23 NOTE — Care Management Note (Signed)
Case Management Note  Patient Details  Name: [name] MRN: [id number] Date of Birth: Oct 22, 1946  Subjective/Objective:                  Rt TKA  Action/Plan: Cm spoke to patient about discharge planning and offered choice for [facility] PT/OT/RN and she chose [place] ([facility]). CM called [name] with [facility] to advise of referral and referral accepted. Patient has a RW, BSC but said that a tub bench would be helpful. CM messaged [name] with [facility] to advise of need for DME. She said that she has 24 hour supervision with her husband and denied any further needs for CM. CM remains available for further needs. CM called [name] on 5N to advise of need for [facility] orders and face to face.   Expected Discharge Date:  07/23/16              Expected Discharge Plan:  [place]  In-House Referral:     Discharge planning Services  CM Consult  Post Acute Care Choice:  Durable Medical Equipment, Home Health Choice offered to:  Patient  DME Arranged:  Tub bench DME Agency:  [place] Arranged:  RN, PT, OT [facility] Agency:  [facility] (now [facility])  Status of Service:  Completed, signed off  If discussed at [place] of Stay Meetings, dates discussed:    Additional Comments:  Guido Sander, RN 07/23/2016, 1:35 PM

## 2016-07-24 ENCOUNTER — Encounter (HOSPITAL_COMMUNITY): Payer: Self-pay | Admitting: Orthopedic Surgery

## 2016-07-24 DIAGNOSIS — Z471 Aftercare following joint replacement surgery: Secondary | ICD-10-CM | POA: Diagnosis not present

## 2016-07-24 DIAGNOSIS — F419 Anxiety disorder, unspecified: Secondary | ICD-10-CM | POA: Diagnosis not present

## 2016-07-24 DIAGNOSIS — K210 Gastro-esophageal reflux disease with esophagitis: Secondary | ICD-10-CM | POA: Diagnosis not present

## 2016-07-24 DIAGNOSIS — I10 Essential (primary) hypertension: Secondary | ICD-10-CM | POA: Diagnosis not present

## 2016-07-24 DIAGNOSIS — F329 Major depressive disorder, single episode, unspecified: Secondary | ICD-10-CM | POA: Diagnosis not present

## 2016-07-25 DIAGNOSIS — F329 Major depressive disorder, single episode, unspecified: Secondary | ICD-10-CM | POA: Diagnosis not present

## 2016-07-25 DIAGNOSIS — I10 Essential (primary) hypertension: Secondary | ICD-10-CM | POA: Diagnosis not present

## 2016-07-25 DIAGNOSIS — F419 Anxiety disorder, unspecified: Secondary | ICD-10-CM | POA: Diagnosis not present

## 2016-07-25 DIAGNOSIS — Z471 Aftercare following joint replacement surgery: Secondary | ICD-10-CM | POA: Diagnosis not present

## 2016-07-25 DIAGNOSIS — K210 Gastro-esophageal reflux disease with esophagitis: Secondary | ICD-10-CM | POA: Diagnosis not present

## 2016-07-26 DIAGNOSIS — F419 Anxiety disorder, unspecified: Secondary | ICD-10-CM | POA: Diagnosis not present

## 2016-07-26 DIAGNOSIS — K210 Gastro-esophageal reflux disease with esophagitis: Secondary | ICD-10-CM | POA: Diagnosis not present

## 2016-07-26 DIAGNOSIS — I10 Essential (primary) hypertension: Secondary | ICD-10-CM | POA: Diagnosis not present

## 2016-07-26 DIAGNOSIS — F329 Major depressive disorder, single episode, unspecified: Secondary | ICD-10-CM | POA: Diagnosis not present

## 2016-07-26 DIAGNOSIS — Z471 Aftercare following joint replacement surgery: Secondary | ICD-10-CM | POA: Diagnosis not present

## 2016-07-27 DIAGNOSIS — F419 Anxiety disorder, unspecified: Secondary | ICD-10-CM | POA: Diagnosis not present

## 2016-07-27 DIAGNOSIS — K210 Gastro-esophageal reflux disease with esophagitis: Secondary | ICD-10-CM | POA: Diagnosis not present

## 2016-07-27 DIAGNOSIS — I10 Essential (primary) hypertension: Secondary | ICD-10-CM | POA: Diagnosis not present

## 2016-07-27 DIAGNOSIS — Z471 Aftercare following joint replacement surgery: Secondary | ICD-10-CM | POA: Diagnosis not present

## 2016-07-27 DIAGNOSIS — F329 Major depressive disorder, single episode, unspecified: Secondary | ICD-10-CM | POA: Diagnosis not present

## 2016-07-28 DIAGNOSIS — F419 Anxiety disorder, unspecified: Secondary | ICD-10-CM | POA: Diagnosis not present

## 2016-07-28 DIAGNOSIS — F329 Major depressive disorder, single episode, unspecified: Secondary | ICD-10-CM | POA: Diagnosis not present

## 2016-07-28 DIAGNOSIS — I10 Essential (primary) hypertension: Secondary | ICD-10-CM | POA: Diagnosis not present

## 2016-07-28 DIAGNOSIS — K210 Gastro-esophageal reflux disease with esophagitis: Secondary | ICD-10-CM | POA: Diagnosis not present

## 2016-07-28 DIAGNOSIS — Z471 Aftercare following joint replacement surgery: Secondary | ICD-10-CM | POA: Diagnosis not present

## 2016-07-29 DIAGNOSIS — K210 Gastro-esophageal reflux disease with esophagitis: Secondary | ICD-10-CM | POA: Diagnosis not present

## 2016-07-29 DIAGNOSIS — I10 Essential (primary) hypertension: Secondary | ICD-10-CM | POA: Diagnosis not present

## 2016-07-29 DIAGNOSIS — F419 Anxiety disorder, unspecified: Secondary | ICD-10-CM | POA: Diagnosis not present

## 2016-07-29 DIAGNOSIS — F329 Major depressive disorder, single episode, unspecified: Secondary | ICD-10-CM | POA: Diagnosis not present

## 2016-07-29 DIAGNOSIS — Z471 Aftercare following joint replacement surgery: Secondary | ICD-10-CM | POA: Diagnosis not present

## 2016-07-31 DIAGNOSIS — M1711 Unilateral primary osteoarthritis, right knee: Secondary | ICD-10-CM | POA: Diagnosis not present

## 2016-08-02 DIAGNOSIS — M1711 Unilateral primary osteoarthritis, right knee: Secondary | ICD-10-CM | POA: Diagnosis not present

## 2016-08-02 DIAGNOSIS — Z7901 Long term (current) use of anticoagulants: Secondary | ICD-10-CM | POA: Diagnosis not present

## 2016-08-03 DIAGNOSIS — Z471 Aftercare following joint replacement surgery: Secondary | ICD-10-CM | POA: Diagnosis not present

## 2016-08-03 DIAGNOSIS — Z96651 Presence of right artificial knee joint: Secondary | ICD-10-CM | POA: Diagnosis not present

## 2016-08-07 DIAGNOSIS — M1711 Unilateral primary osteoarthritis, right knee: Secondary | ICD-10-CM | POA: Diagnosis not present

## 2016-08-10 DIAGNOSIS — Z5181 Encounter for therapeutic drug level monitoring: Secondary | ICD-10-CM | POA: Diagnosis not present

## 2016-08-10 DIAGNOSIS — M1711 Unilateral primary osteoarthritis, right knee: Secondary | ICD-10-CM | POA: Diagnosis not present

## 2016-08-15 DIAGNOSIS — M1711 Unilateral primary osteoarthritis, right knee: Secondary | ICD-10-CM | POA: Diagnosis not present

## 2016-08-16 DIAGNOSIS — Z5181 Encounter for therapeutic drug level monitoring: Secondary | ICD-10-CM | POA: Diagnosis not present

## 2016-08-17 DIAGNOSIS — M1711 Unilateral primary osteoarthritis, right knee: Secondary | ICD-10-CM | POA: Diagnosis not present

## 2016-08-21 DIAGNOSIS — M1711 Unilateral primary osteoarthritis, right knee: Secondary | ICD-10-CM | POA: Diagnosis not present

## 2016-08-24 DIAGNOSIS — M1711 Unilateral primary osteoarthritis, right knee: Secondary | ICD-10-CM | POA: Diagnosis not present

## 2016-08-29 DIAGNOSIS — M1711 Unilateral primary osteoarthritis, right knee: Secondary | ICD-10-CM | POA: Diagnosis not present

## 2016-08-31 DIAGNOSIS — M1711 Unilateral primary osteoarthritis, right knee: Secondary | ICD-10-CM | POA: Diagnosis not present

## 2016-09-04 DIAGNOSIS — M1711 Unilateral primary osteoarthritis, right knee: Secondary | ICD-10-CM | POA: Diagnosis not present

## 2016-09-06 DIAGNOSIS — Z471 Aftercare following joint replacement surgery: Secondary | ICD-10-CM | POA: Diagnosis not present

## 2016-09-06 DIAGNOSIS — Z96651 Presence of right artificial knee joint: Secondary | ICD-10-CM | POA: Diagnosis not present

## 2016-09-12 DIAGNOSIS — M1711 Unilateral primary osteoarthritis, right knee: Secondary | ICD-10-CM | POA: Diagnosis not present

## 2016-09-14 DIAGNOSIS — M1711 Unilateral primary osteoarthritis, right knee: Secondary | ICD-10-CM | POA: Diagnosis not present

## 2016-09-15 DIAGNOSIS — Z23 Encounter for immunization: Secondary | ICD-10-CM | POA: Diagnosis not present

## 2016-09-19 DIAGNOSIS — M1711 Unilateral primary osteoarthritis, right knee: Secondary | ICD-10-CM | POA: Diagnosis not present

## 2016-09-21 DIAGNOSIS — M1711 Unilateral primary osteoarthritis, right knee: Secondary | ICD-10-CM | POA: Diagnosis not present

## 2016-09-26 DIAGNOSIS — M1711 Unilateral primary osteoarthritis, right knee: Secondary | ICD-10-CM | POA: Diagnosis not present

## 2016-09-28 DIAGNOSIS — M1711 Unilateral primary osteoarthritis, right knee: Secondary | ICD-10-CM | POA: Diagnosis not present

## 2016-10-02 DIAGNOSIS — M1711 Unilateral primary osteoarthritis, right knee: Secondary | ICD-10-CM | POA: Diagnosis not present

## 2016-10-04 DIAGNOSIS — Z471 Aftercare following joint replacement surgery: Secondary | ICD-10-CM | POA: Diagnosis not present

## 2016-10-04 DIAGNOSIS — Z96651 Presence of right artificial knee joint: Secondary | ICD-10-CM | POA: Diagnosis not present

## 2016-11-06 ENCOUNTER — Other Ambulatory Visit: Payer: Self-pay | Admitting: Family Medicine

## 2016-11-06 DIAGNOSIS — Z1231 Encounter for screening mammogram for malignant neoplasm of breast: Secondary | ICD-10-CM

## 2016-11-09 NOTE — Addendum Note (Signed)
Addendum  created 11/09/16 1955 by Nilda Simmer, MD   Sign clinical note

## 2016-11-20 DIAGNOSIS — N183 Chronic kidney disease, stage 3 (moderate): Secondary | ICD-10-CM | POA: Diagnosis not present

## 2016-11-20 DIAGNOSIS — K219 Gastro-esophageal reflux disease without esophagitis: Secondary | ICD-10-CM | POA: Diagnosis not present

## 2016-11-20 DIAGNOSIS — M8580 Other specified disorders of bone density and structure, unspecified site: Secondary | ICD-10-CM | POA: Diagnosis not present

## 2016-11-20 DIAGNOSIS — M5430 Sciatica, unspecified side: Secondary | ICD-10-CM | POA: Diagnosis not present

## 2016-11-20 DIAGNOSIS — Z0000 Encounter for general adult medical examination without abnormal findings: Secondary | ICD-10-CM | POA: Diagnosis not present

## 2016-11-20 DIAGNOSIS — E7800 Pure hypercholesterolemia, unspecified: Secondary | ICD-10-CM | POA: Diagnosis not present

## 2016-11-20 DIAGNOSIS — I129 Hypertensive chronic kidney disease with stage 1 through stage 4 chronic kidney disease, or unspecified chronic kidney disease: Secondary | ICD-10-CM | POA: Diagnosis not present

## 2016-11-20 DIAGNOSIS — M1990 Unspecified osteoarthritis, unspecified site: Secondary | ICD-10-CM | POA: Diagnosis not present

## 2016-11-20 DIAGNOSIS — E669 Obesity, unspecified: Secondary | ICD-10-CM | POA: Diagnosis not present

## 2016-11-20 DIAGNOSIS — M549 Dorsalgia, unspecified: Secondary | ICD-10-CM | POA: Diagnosis not present

## 2016-11-20 DIAGNOSIS — Z1389 Encounter for screening for other disorder: Secondary | ICD-10-CM | POA: Diagnosis not present

## 2016-11-20 DIAGNOSIS — Z6841 Body Mass Index (BMI) 40.0 and over, adult: Secondary | ICD-10-CM | POA: Diagnosis not present

## 2016-12-13 ENCOUNTER — Ambulatory Visit
Admission: RE | Admit: 2016-12-13 | Discharge: 2016-12-13 | Disposition: A | Discharge status: Home / Self Care | Payer: Medicare Other | Source: Ambulatory Visit | Attending: Family Medicine | Admitting: Family Medicine

## 2016-12-13 DIAGNOSIS — Z1231 Encounter for screening mammogram for malignant neoplasm of breast: Secondary | ICD-10-CM | POA: Diagnosis not present

## 2017-01-05 DIAGNOSIS — K219 Gastro-esophageal reflux disease without esophagitis: Secondary | ICD-10-CM | POA: Diagnosis not present

## 2017-01-10 DIAGNOSIS — Z471 Aftercare following joint replacement surgery: Secondary | ICD-10-CM | POA: Diagnosis not present

## 2017-01-10 DIAGNOSIS — Z96651 Presence of right artificial knee joint: Secondary | ICD-10-CM | POA: Diagnosis not present

## 2017-01-10 DIAGNOSIS — M1711 Unilateral primary osteoarthritis, right knee: Secondary | ICD-10-CM | POA: Diagnosis not present

## 2017-01-15 ENCOUNTER — Encounter (INDEPENDENT_AMBULATORY_CARE_PROVIDER_SITE_OTHER): Payer: Self-pay

## 2017-01-15 ENCOUNTER — Encounter: Payer: Self-pay | Admitting: Physician Assistant

## 2017-01-15 ENCOUNTER — Ambulatory Visit (INDEPENDENT_AMBULATORY_CARE_PROVIDER_SITE_OTHER): Payer: Medicare Other | Admitting: Physician Assistant

## 2017-01-15 VITALS — BP 132/80 | HR 80 | Ht 66.0 in | Wt 246.2 lb

## 2017-01-15 DIAGNOSIS — K2100 Gastro-esophageal reflux disease with esophagitis, without bleeding: Secondary | ICD-10-CM

## 2017-01-15 NOTE — Patient Instructions (Signed)
We have provided you with Anti-reflux information.  Call us back in 4-6 weeks if you decide you want a prescription for Zantac ( Ranitidine).    Continue Nexium 40 mg twice daily for 2 weeks then take 1 tab every morning for 1 month then take Nexium over the counter daily.   Stay off supplements for 2 weeks then resume.  Stay off Aspirin for 2 weeks then resume.   Follow up with Nicoletta Ba PA or Dr. Harl Bowie as needed.

## 2017-01-15 NOTE — Progress Notes (Signed)
Subjective:    [name], female    DOB: 07-31-1946, 71 y.o.   MRN: [id number]  HPI [name] is a pleasant 71 year old white female, former patient of Dr. Delfin Edis who would like to establish with Dr. Silverio Decamp. She has history of GERD, IBS, hypertension, morbid obesity, and underwent the replacement in August 2017. She comes in today with complaints of burning chest pain. She says she had weaned herself completely off of Nexium year or so ago and was only using OTC Nexium as needed. She says her around Christmas time she noticed recurrent burning in her esophagus which had worsened since the beginning of January. She says this was fairly constant and uncomfortable along her substernal area and under her right breast. She stopped using caffeine T etc. and has been eating a very bland diet. She is also had to stop her baby aspirin because she felt like this was making it worse. She was seen by Dr. Laurann Montana about 2 weeks ago who started her on twice daily Nexium 40 mg. She says she's much better at this point. She is not having any dysphagia or diarrhea dysphagia, denies any abdominal pain, no melena or hematochezia. She tried to resume her aspirin but had some residual burning and is still holding it. She also complains of alternating bowel habits which she has had long-term. She has found probiotics and Benefiber helpful. No recent changes, no hematochezia. Last colonoscopy April 2015 with mild diverticulosis diffusely no polyps. She does have family history of colon cancer Last EGD 2008 with finding of a 2 cm hiatal hernia and reflux esophagitis.  Review of Systems Pertinent positive and negative review of systems were noted in the above HPI section.  All other review of systems was otherwise negative.  Outpatient Encounter Prescriptions as of 01/15/2017  Medication Sig  . amLODipine (NORVASC) 10 MG tablet Take 10 mg by mouth daily.  Marland Kitchen esomeprazole (NEXIUM) 40 MG capsule Take  40 mg by mouth 2 (two) times daily before a meal.   . quinapril (ACCUPRIL) 40 MG tablet Take 1 tablet (40 mg total) by mouth at bedtime.  Marland Kitchen aspirin EC 81 MG tablet Take 81 mg by mouth daily.  . Cholecalciferol (VITAMIN D-3) 1000 UNITS CAPS Take 2,000 Units by mouth daily.  . diphenhydrAMINE (ALLERGY) 25 mg capsule Take 25 mg by mouth every 6 (six) hours as needed.  Marland Kitchen FLAXSEED, LINSEED, PO Take 1,000 mg by mouth daily.  Marland Kitchen glucosamine-chondroitin 500-400 MG tablet Take 1 tablet by mouth 2 (two) times daily.   Marland Kitchen KRILL OIL PO Take 2 capsules by mouth daily.  . Lecith-Inosi-Chol-B12-Liver (LIVERITE PO) Take 2 tablets by mouth at bedtime.  . methocarbamol (ROBAXIN) 500 MG tablet Take 1 tablet (500 mg total) by mouth 3 (three) times daily as needed. (Patient not taking: Reported on 01/15/2017)  . OVER THE COUNTER MEDICATION Take 1 tablet by mouth daily. 1000mg  milk thissel  . Turmeric Curcumin 500 MG CAPS Take 1 capsule by mouth daily.  . vitamin B-12 (CYANOCOBALAMIN) 1000 MCG tablet Take 1,000 mcg by mouth daily.  . vitamin C (ASCORBIC ACID) 500 MG tablet Take 500 mg by mouth daily.  . [DISCONTINUED] oxyCODONE-acetaminophen (ROXICET) 5-325 MG tablet Take 1-2 tablets by mouth every 4 (four) hours as needed for severe pain. (Patient not taking: Reported on 01/15/2017)  . [DISCONTINUED] warfarin (COUMADIN) 5 MG tablet Take 1 tablet (5 mg total) by mouth daily. Take as directed per the pharmacist for INR target  from 2.5-3.0 for 30 days post op   No facility-administered encounter medications on file as of 01/15/2017.    Allergies  Allergen Reactions  . Statins Swelling and Other (See Comments)    MYALGIAS SWELLING OF LEGS  . Losartan     sick  . Penicillins Rash    Has patient had a PCN reaction causing immediate rash, facial/tongue/throat swelling, SOB or lightheadedness with hypotension: no Has patient had a PCN reaction causing severe rash involving mucus membranes or skin necrosis: no Has patient  had a PCN reaction that required hospitalization no Has patient had a PCN reaction occurring within the last 10 years: no If all of the above answers are "NO", then may proceed with Cephalosporin use.   Patient Active Problem List   Diagnosis Date Noted  . 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 Morbid obesity ([place]) 06/16/2016  . Preoperative cardiovascular examination 06/16/2016  . Hyperlipidemia   . ANXIETY 03/14/2008  . DEPRESSION 03/14/2008  . Essential hypertension 03/14/2008  . GERD 03/14/2008  . ESOPHAGITIS, REFLUX 07/25/2007  . HIATAL HERNIA 07/25/2007  . DIVERTICULOSIS, COLON 02/07/2007   Social History   Social History  . Marital status: Married    Spouse name: N/A  . Number of children: N/A  . Years of education: N/A   Occupational History  . Not on file.   Social History Main Topics  . Smoking status: Never Smoker  . Smokeless tobacco: Never Used  . Alcohol use No  . Drug use: No  . Sexual activity: Not on file   Other Topics Concern  . Not on file   Social History Narrative  . No narrative on file    [name]'s family history includes Colon cancer (age of onset: 47) in her sister; Colon cancer (age of onset: 29) in her father; Heart disease (age of onset: 63) in her mother; Heart failure in her father; Hyperlipidemia in her mother; Hypertension in her mother; Sudden death (age of onset: 10) in her paternal grandfather; Sudden death (age of onset: 71) in her maternal grandfather.      Objective:    Vitals:   01/15/17 1046  BP: 132/80  Pulse: 80    Physical Exam;Well-developed white female in no acute distress, pleasant blood pressure 132/80 pulse 80 height 5 foot 6, weight 246, BMI 39.7. HEENT ;nontraumatic normocephalic EOMI PERRLA sclera anicteric, Cardiovascular ;regular rate and rhythm with S1-S2 no murmur or gallop Pulm; clear bilaterally, Abdomen; obese soft, bowel sounds are present there is no palpable mass or hepatosplenomegaly basically  nontender, Rectal; exam not done, Extremities; no clubbing cyanosis or edema skin warm and dry, Neuropsych; mood and affect appropriate       Assessment & Plan:   #73 71 year old white female with history of GERD and previous esophagitis 2 presents with 1 month history of renal chest burning consistent with acute esophagitis. She has improved since restarting twice a day Nexium  #2 IBS with alternating bowel habits #3 diverticulosis mild diffuse #4 positive family history of colon cancer-up-to-date with screening last colonoscopy April 2015 #5 morbid obesity  Plan; Patient will continue twice daily Nexium 40 mg for 2 more weeks then decrease to 40 mg by mouth every morning. She really prefers not to stay on a long-term PPI if possible. We discussed decreasing to 20 mg of Nexium longer-term and if she prefers after symptoms are completely resolved -can give her a prescription of Zantac 150 mg by mouth twice a day for maintenance. She  will resume daily probiotic and Benefiber for IBS. Patient will follow-up with Dr. Silverio Decamp or myself on an as-needed basis. She was also given an antireflux regimen and diet today   Alfredia Ferguson PA-C 01/15/2017   Cc: Kelton Pillar, MD

## 2017-01-15 NOTE — Progress Notes (Signed)
Reviewed and agree with documentation and assessment and plan. K. Veena Nandigam , MD   

## 2017-01-29 DIAGNOSIS — Z85828 Personal history of other malignant neoplasm of skin: Secondary | ICD-10-CM | POA: Diagnosis not present

## 2017-01-29 DIAGNOSIS — D1801 Hemangioma of skin and subcutaneous tissue: Secondary | ICD-10-CM | POA: Diagnosis not present

## 2017-01-29 DIAGNOSIS — L738 Other specified follicular disorders: Secondary | ICD-10-CM | POA: Diagnosis not present

## 2017-01-29 DIAGNOSIS — L821 Other seborrheic keratosis: Secondary | ICD-10-CM | POA: Diagnosis not present

## 2017-01-29 DIAGNOSIS — D225 Melanocytic nevi of trunk: Secondary | ICD-10-CM | POA: Diagnosis not present

## 2017-01-29 DIAGNOSIS — L304 Erythema intertrigo: Secondary | ICD-10-CM | POA: Diagnosis not present

## 2017-02-06 DIAGNOSIS — Z780 Asymptomatic menopausal state: Secondary | ICD-10-CM | POA: Diagnosis not present

## 2017-02-06 DIAGNOSIS — M8588 Other specified disorders of bone density and structure, other site: Secondary | ICD-10-CM | POA: Diagnosis not present

## 2017-03-13 ENCOUNTER — Other Ambulatory Visit: Payer: Self-pay

## 2017-03-13 ENCOUNTER — Telehealth: Payer: Self-pay | Admitting: Physician Assistant

## 2017-03-13 MED ORDER — RANITIDINE HCL 150 MG PO TABS: 150.0000 mg | ORAL_TABLET | Freq: Two times a day (BID) | ORAL | 5 refills | Status: DC

## 2017-03-13 NOTE — Telephone Encounter (Signed)
Patient reports she has followed the taper of Nexium as discussed at her visit in February. She is ready to begin Zantac 150 mg BID as also discussed at her office visit. She will contact us if she has a return of her GI symptoms and goes back on Nexium.

## 2017-05-22 ENCOUNTER — Other Ambulatory Visit: Payer: Self-pay | Admitting: Family Medicine

## 2017-05-22 ENCOUNTER — Ambulatory Visit
Admission: RE | Admit: 2017-05-22 | Discharge: 2017-05-22 | Disposition: A | Discharge status: Home / Self Care | Payer: Medicare Other | Source: Ambulatory Visit | Attending: Family Medicine | Admitting: Family Medicine

## 2017-05-22 DIAGNOSIS — I129 Hypertensive chronic kidney disease with stage 1 through stage 4 chronic kidney disease, or unspecified chronic kidney disease: Secondary | ICD-10-CM | POA: Diagnosis not present

## 2017-05-22 DIAGNOSIS — R7303 Prediabetes: Secondary | ICD-10-CM | POA: Diagnosis not present

## 2017-05-22 DIAGNOSIS — E7800 Pure hypercholesterolemia, unspecified: Secondary | ICD-10-CM | POA: Diagnosis not present

## 2017-05-22 DIAGNOSIS — R0602 Shortness of breath: Secondary | ICD-10-CM

## 2017-05-22 DIAGNOSIS — K219 Gastro-esophageal reflux disease without esophagitis: Secondary | ICD-10-CM | POA: Diagnosis not present

## 2017-05-22 DIAGNOSIS — N183 Chronic kidney disease, stage 3 (moderate): Secondary | ICD-10-CM | POA: Diagnosis not present

## 2017-05-28 ENCOUNTER — Encounter (HOSPITAL_COMMUNITY): Payer: Self-pay | Admitting: Emergency Medicine

## 2017-05-28 ENCOUNTER — Telehealth (HOSPITAL_COMMUNITY): Payer: Self-pay | Admitting: Cardiovascular Disease

## 2017-05-28 ENCOUNTER — Emergency Department (HOSPITAL_COMMUNITY)
Admission: EM | Admit: 2017-05-28 | Discharge: 2017-05-28 | Disposition: A | Discharge status: Home / Self Care | Payer: Medicare Other | Attending: Emergency Medicine | Admitting: Emergency Medicine

## 2017-05-28 ENCOUNTER — Emergency Department (HOSPITAL_COMMUNITY): Payer: Medicare Other

## 2017-05-28 ENCOUNTER — Other Ambulatory Visit: Payer: Self-pay | Admitting: Physician Assistant

## 2017-05-28 DIAGNOSIS — Z96651 Presence of right artificial knee joint: Secondary | ICD-10-CM | POA: Diagnosis not present

## 2017-05-28 DIAGNOSIS — R0609 Other forms of dyspnea: Principal | ICD-10-CM

## 2017-05-28 DIAGNOSIS — I10 Essential (primary) hypertension: Secondary | ICD-10-CM | POA: Insufficient documentation

## 2017-05-28 DIAGNOSIS — R0602 Shortness of breath: Secondary | ICD-10-CM

## 2017-05-28 DIAGNOSIS — R0789 Other chest pain: Secondary | ICD-10-CM

## 2017-05-28 DIAGNOSIS — Z7982 Long term (current) use of aspirin: Secondary | ICD-10-CM | POA: Diagnosis not present

## 2017-05-28 DIAGNOSIS — R0600 Dyspnea, unspecified: Secondary | ICD-10-CM

## 2017-05-28 DIAGNOSIS — R1013 Epigastric pain: Secondary | ICD-10-CM | POA: Diagnosis not present

## 2017-05-28 DIAGNOSIS — Z79899 Other long term (current) drug therapy: Secondary | ICD-10-CM | POA: Diagnosis not present

## 2017-05-28 DIAGNOSIS — Z85828 Personal history of other malignant neoplasm of skin: Secondary | ICD-10-CM | POA: Diagnosis not present

## 2017-05-28 DIAGNOSIS — R079 Chest pain, unspecified: Secondary | ICD-10-CM | POA: Diagnosis not present

## 2017-05-28 LAB — CBC
HCT: 47.3 % — ABNORMAL HIGH (ref 36.0–46.0)
Hemoglobin: 15.8 g/dL — ABNORMAL HIGH (ref 12.0–15.0)
MCH: 29 pg (ref 26.0–34.0)
MCHC: 33.4 g/dL (ref 30.0–36.0)
MCV: 86.9 fL (ref 78.0–100.0)
PLATELETS: 208 10*3/uL (ref 150–400)
RBC: 5.44 MIL/uL — AB (ref 3.87–5.11)
RDW: 13.5 % (ref 11.5–15.5)
WBC: 8.6 10*3/uL (ref 4.0–10.5)

## 2017-05-28 LAB — I-STAT TROPONIN, ED
TROPONIN I, POC: 0 ng/mL (ref 0.00–0.08)
TROPONIN I, POC: 0 ng/mL (ref 0.00–0.08)

## 2017-05-28 LAB — BASIC METABOLIC PANEL
Anion gap: 11 (ref 5–15)
BUN: 12 mg/dL (ref 6–20)
CHLORIDE: 104 mmol/L (ref 101–111)
CO2: 25 mmol/L (ref 22–32)
CREATININE: 0.97 mg/dL (ref 0.44–1.00)
Calcium: 9.5 mg/dL (ref 8.9–10.3)
GFR, EST NON AFRICAN AMERICAN: 58 mL/min — AB (ref >60)
Glucose, Bld: 113 mg/dL — ABNORMAL HIGH (ref 65–99)
Potassium: 4 mmol/L (ref 3.5–5.1)
SODIUM: 140 mmol/L (ref 135–145)

## 2017-05-28 LAB — D-DIMER, QUANTITATIVE (NOT AT ARMC): D DIMER QUANT: 0.72 ug{FEU}/mL — AB (ref 0.00–0.50)

## 2017-05-28 MED ORDER — GI COCKTAIL ~~LOC~~
30.0000 mL | Freq: Once | ORAL | Status: AC
  Administered 2017-05-28: 30 mL via ORAL
  Filled 2017-05-28: qty 30

## 2017-05-28 MED ORDER — IOPAMIDOL (ISOVUE-370) INJECTION 76%
INTRAVENOUS | Status: AC
  Administered 2017-05-28: 100 mL
  Filled 2017-05-28: qty 100

## 2017-05-28 MED ORDER — ASPIRIN 81 MG PO CHEW
324.0000 mg | CHEWABLE_TABLET | Freq: Once | ORAL | Status: AC
  Administered 2017-05-28: 324 mg via ORAL
  Filled 2017-05-28: qty 4

## 2017-05-28 NOTE — Discharge Instructions (Signed)
Blood work and imaging has been reassuring. You do have a follow-up for a stress test as discussed with cardiology. Return to the ED if he developed worsening chest pain, worsening shortness of breath or for any new concerns.

## 2017-05-28 NOTE — ED Notes (Signed)
Called main lab to add on ddimer. 

## 2017-05-28 NOTE — Telephone Encounter (Signed)
That should be fine. His appointment is on 07/23/17. There is an open 0820 slot.

## 2017-05-28 NOTE — ED Notes (Signed)
IV team at bedside 

## 2017-05-28 NOTE — ED Provider Notes (Signed)
Care assumed from previous provider PA [place]. Please see their note for further details to include full history and physical. To summarize in short pt is a 71 year old female that presents to the ED today with chest pain. Patient is followed by cardiology. Last catheterization was in 2007 without any acute findings. Patient had pending CTA to rule out PE, pending second troponin, cardiology consult. Case discussed, plan agreed upon. The patient's second troponin was negative. CTA of chest reveals no PE or other acute abnormalities. Cardiology evaluated patient and states that she does need a stress test but this can be done outpatient setting and they have recommended discharge with cardiology follow-up. The patient is agreeable with the above plan. All question answered prior to d/c. PRIOR TO DISCHARGE. PATIENT IS CURRENTLY HEMODYNAMICALLY STABLE IN NO ACUTE DISTRESS.   Doristine Devoid, PA-C 05/28/17 1211    Carmin Muskrat, MD 05/28/17 [id number]

## 2017-05-28 NOTE — ED Notes (Signed)
Called CT to notify that IV is in place.

## 2017-05-28 NOTE — Consult Note (Signed)
Cardiology Consultation:   [name]; [id number]; 11-Oct-1946   Admission date: 05/28/2017  Primary Care Provider: Kelton Pillar, MD Primary Cardiologist: Dr. Sallyanne Kuster  Cardiology consultation by ED PA Charlann Lange for evaluation of DOE  Patient Profile:   [name] is a 71 y.o. female with a history of HTN, HLD intolerant to statins and fenofibrate due to myalgia, borderline diabetes and GERD present for evaluation of DOE x 3 days  History of Present Illness:   [name] is a pleasant 71 yo female with PMH of HTN, HLD intolerant to statins and fenofibrate due to myalgia, borderline diabetes, obesity and GERD. She had family history of early CAD with her mother having angina in her 40s. She had a cardiac catheterization performed on 05/01/2006 which showed a normal coronaries. She also had a Myoview performed on 07/10/2007 that showed EF 75%, normal perfusion. She has not had any further cardiac workup since. The last time she was seen in the cardiology office was on 06/15/2016, due to persistent cough, her ACE inhibitor was stopped and she was switched to oral valsartan. Unfortunately she is unable to tolerate the valsartan due to diarrhea and restarted on the previous quinapril.   She said she went to see her grandson playing a ball game on Saturday, walking out from the game, she started noticing increasing shortness of breath walking to the parking lot. That night, she also noted tingling sensation in the left shoulder as well. She denies any obvious chest pain at the time. However dyspnea on exertion continued to persist intermittently for the next 3 days, more noticeable with exertion. This morning, she got up around 4 AM to go to the bathroom, walking to the bathroom, she started having dyspnea on exertion and also burning sensation in the left shoulder and tingling sensation as well. She eventually sought medical attention at [facility]. Initial  point-of-care troponin was negative 2. EKG showed no ischemic changes. D-dimer was elevated at 0.72, however CT angiogram of the chest was negative. Chest x-ray was negative for acute process. Cardiology has been consulted for dyspnea on exertion.   Past Medical History:  Diagnosis Date  . GERD (gastroesophageal reflux disease)   . HTN (hypertension)   . Hyperlipidemia   . Hypertension   . Normal cardiac stress test 2011   echo stress with normal LV function., Normal 2-D echo 2008  . Obesity   . 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 ABDOMINAL HYSTERECTOMY  2001  . APPENDECTOMY  1957  . CARDIAC CATHETERIZATION  2007   Patent coronary arteries  . KNEE ARTHROSCOPY Right 1999  . LAPAROSCOPIC CHOLECYSTECTOMY  2007  . TONSILECTOMY/ADENOIDECTOMY WITH MYRINGOTOMY  1963  . TOTAL KNEE ARTHROPLASTY Right 07/21/2016   Procedure: RIGHT TOTAL KNEE ARTHROPLASTY;  Surgeon: Netta Cedars, MD;  Location: [facility];  Service: Orthopedics;  Laterality: Right;     Medications Prior to Admission: Prior to Admission medications   Medication Sig Start Date End Date Taking? Authorizing Provider  amLODipine (NORVASC) 10 MG tablet Take 10 mg by mouth daily.   Yes [provider]  aspirin EC 81 MG tablet Take 81 mg by mouth daily.   Yes [provider]  b complex vitamins tablet Take 1 tablet by mouth daily.   Yes [provider]  Cholecalciferol (VITAMIN D-3) 1000 UNITS CAPS Take 2,000 Units by mouth daily.   Yes [provider]  diphenhydrAMINE (ALLERGY) 25 mg capsule Take 25 mg by mouth every 6 (six) hours as needed for allergies.    Yes [provider]  esomeprazole (NEXIUM) 40 MG capsule Take 40 mg by mouth daily as needed (for reflux).    Yes [provider]  FLAXSEED, LINSEED, PO Take 1,000 mg by mouth daily.   Yes [provider]  KRILL OIL PO Take 2 capsules by mouth daily.   Yes  [provider]  Lecith-Inosi-Chol-B12-Liver (LIVERITE PO) Take 2 tablets by mouth at bedtime.   Yes [provider]  OVER THE COUNTER MEDICATION Take 1 tablet by mouth daily. 1000mg  milk thissel   Yes [provider]  quinapril (ACCUPRIL) 40 MG tablet Take 1 tablet (40 mg total) by mouth at bedtime. 07/17/16  Yes Croitoru, Mihai, MD  ranitidine (ZANTAC) 150 MG tablet Take 1 tablet (150 mg total) by mouth 2 (two) times daily. 03/13/17  Yes Esterwood, Amy S, PA-C  Turmeric Curcumin 500 MG CAPS Take 1 capsule by mouth daily.   Yes [provider]  vitamin C (ASCORBIC ACID) 500 MG tablet Take 500 mg by mouth daily.   Yes [provider]  methocarbamol (ROBAXIN) 500 MG tablet Take 1 tablet (500 mg total) by mouth 3 (three) times daily as needed. Patient not taking: Reported on 01/15/2017 07/21/16   Netta Cedars, MD     Allergies:    Allergies  Allergen Reactions  . Statins Swelling and Other (See Comments)    MYALGIAS SWELLING OF LEGS  . Losartan     sick  . Penicillins Rash    Has patient had a PCN reaction causing immediate rash, facial/tongue/throat swelling, SOB or lightheadedness with hypotension: no Has patient had a PCN reaction causing severe rash involving mucus membranes or skin necrosis: no Has patient had a PCN reaction that required hospitalization no Has patient had a PCN reaction occurring within the last 10 years: no If all of the above answers are "NO", then may proceed with Cephalosporin use.    Social History:   Social History   Social History  . Marital status: Married    Spouse name: N/A  . Number of children: N/A  . Years of education: N/A   Occupational History  . Not on file.   Social History Main Topics  . Smoking status: Never Smoker  . Smokeless tobacco: Never Used  . Alcohol use No  . Drug use: No  . Sexual activity: Not on file   Other Topics Concern  . Not on file   Social History Narrative  . No  narrative on file    Family History:   The patient's family history includes Colon cancer (age of onset: 43) in her sister; Colon cancer (age of onset: 49) in her father; Heart disease (age of onset: 36) in her mother; Heart failure in her father; Hyperlipidemia in her mother; Hypertension in her mother; Sudden death (age of onset: 12) in her paternal grandfather; Sudden death (age of onset: 51) in her maternal grandfather. There is no history of Esophageal cancer, Rectal cancer, or Stomach cancer.    ROS:  Please see the history of present illness.  All other ROS reviewed and negative.     Physical Exam/Data:   Vitals:   05/28/17 0444 05/28/17 0530 05/28/17 0600 05/28/17 0630  BP: (!) 164/102 (!) 146/83 103/63 134/84  Pulse: (!) 114 93 73 85  Resp: 17 12 10 13   Temp: 98 F (36.7  C)     TempSrc: Oral     SpO2: 98% 98% 96% 99%  Weight: 253 lb (114.8 kg)     Height: 5\' 6"  (1.676 m)      No intake or output data in the 24 hours ending 05/28/17 1136 Filed Weights   05/28/17 0444  Weight: 253 lb (114.8 kg)   Body mass index is 40.84 kg/m.  General:  Well nourished, well develeped, in no acute distress HEENT: normal Lymph: no adenopathy Neck: no JVD Endocrine:  No thryomegaly Vascular: No carotid bruits; FA pulses 2+ bilaterally without bruits  Cardiac:  normal S1, S2; RRR; no murmur  Lungs:  clear to auscultation bilaterally, no wheezing, rhonchi or rales  Abd: soft, nontender, no hepatomegaly  Ext: no edema Musculoskeletal:  No deformities, BUE and BLE strength normal and equal Skin: warm and dry  Neuro:  CNs 2-12 intact, no focal abnormalities noted Psych:  Normal affect    EKG:  The ECG that was done in ED and was personally reviewed and demonstrates normal sinus rhythm, no significant ST-T wave changes.  Relevant CV Studies:  Cath 05/01/2006   FINAL DIAGNOSES:  1.  Angiographically patent coronary arteries.  2.  Normal left ventricular systolic function.  3.   Successful right femoral AngioSeal.  4.  Chest pain with mixed features of gastrointestinal and cardiac suspected      etiology.   Laboratory Data:  Chemistry  Recent Labs Lab 05/28/17 0448  NA 140  K 4.0  CL 104  CO2 25  GLUCOSE 113*  BUN 12  CREATININE 0.97  CALCIUM 9.5  GFRNONAA 58*  GFRAA >60  ANIONGAP 11    No results for input(s): PROT, ALBUMIN, AST, ALT, ALKPHOS, BILITOT in the last 168 hours. Hematology  Recent Labs Lab 05/28/17 0448  WBC 8.6  RBC 5.44*  HGB 15.8*  HCT 47.3*  MCV 86.9  MCH 29.0  MCHC 33.4  RDW 13.5  PLT 208   Cardiac EnzymesNo results for input(s): TROPONINI in the last 168 hours.   Recent Labs Lab 05/28/17 0457 05/28/17 1001  TROPIPOC 0.00 0.00    BNPNo results for input(s): BNP, PROBNP in the last 168 hours.  DDimer   Recent Labs Lab 05/28/17 0448  DDIMER 0.72*    Radiology/Studies:  Dg Chest 2 View  Result Date: 05/28/2017 CLINICAL DATA:  Chest pain and shortness of breath. EXAM: CHEST  2 VIEW COMPARISON:  Radiograph 05/22/2017 FINDINGS: The cardiomediastinal contours are normal. The lungs are clear. Pulmonary vasculature is normal. No consolidation, pleural effusion, or pneumothorax. No acute osseous abnormalities are seen. IMPRESSION: No acute abnormality.  No change from prior exam. Electronically Signed   By: Jeb Levering M.D.   On: 05/28/2017 05:16   Ct Angio Chest Pe W/cm &/or Wo Cm  Result Date: 05/28/2017 CLINICAL DATA:  Epigastric, left arm and left shoulder pain since Saturday. EXAM: CT ANGIOGRAPHY CHEST WITH CONTRAST TECHNIQUE: Multidetector CT imaging of the chest was performed using the standard protocol during bolus administration of intravenous contrast. Multiplanar CT image reconstructions and MIPs were obtained to evaluate the vascular anatomy. CONTRAST:  100 cc Isovue 370 IV COMPARISON:  None. FINDINGS: Cardiovascular: Heart is normal size. Aorta is normal caliber. No filling defects in the pulmonary  arteries to suggest pulmonary emboli. Mediastinum/Nodes: No mediastinal, hilar, or axillary adenopathy. Lungs/Pleura: Lungs are clear. No focal airspace opacities or suspicious nodules. No effusions. Upper Abdomen: Imaging into the upper abdomen shows no acute findings. Musculoskeletal: Chest wall  soft tissues are unremarkable. No acute bony abnormality. Review of the MIP images confirms the above findings. IMPRESSION: No acute cardiopulmonary disease.  No evidence of pulmonary embolus. Electronically Signed   By: Rolm Baptise M.D.   On: 05/28/2017 11:00    Assessment and Plan:   1. DOE with burning sensation over L shoulder: Symptom intermittent for the past 3 days, worse with exertion. The exertional component is quite concerning especially given her family history of early CAD. On EKG, there is no obvious ischemic changes. Troponin negative 2. Although d-dimer was elevated, CTA of the chest was negative. Will discuss with M.D., both inpatient and outpatient stress test would be reasonable in this case. Personally, I do not think she is volume overloaded on physical exam. Her lung is clear, there is no lower extremity edema on physical exam, there is no JVD.  2. Hypertension: Although initial blood pressure was 164/102 on arrival, her blood pressure normalized without any further treatment  3. Hyperlipidemia: She is on Krill oil at home, history of intolerance to both statin and Fenofibrate due to myalgia. No lipid panel in [facility]  4. Borderline diabetes: no Hgb A1C in our system  5. Elevated d-dimer: CT angiogram of the chest was negative.   Hilbert Corrigan, [place]  05/28/2017 11:36 AM   As above, patient seen and examined. Briefly she is a 71 year old female with past medical history of hypertension, hyperlipidemia, obesity for evaluation of dyspnea and chest pain. Cardiac catheterization in 2007 showed normal coronaries. Nuclear study July 2008 normal.  Patient had her knee replaced in August  2017. Since that time she has noticed worsening dyspnea on exertion. There is no orthopnea, PND, pedal edema or exertional chest pain. Saturday she awoke with a tingling sensation in her left upper extremity, chest and right upper extremity. There is no nausea or diaphoresis. The symptoms lasted for 4 hours and resolve spontaneously. She had brief tingling for several seconds on Sunday and then had more symptoms this morning for 2-3 hours. She is presently asymptomatic. Electrocardiogram shows sinus rhythm, left ventricular hypertrophy but no ST changes. Troponins are normal.  Patient symptoms are atypical. Her dyspnea may be secondary to deconditioning and weight gain following her knee replacement. She has essentially ruled out with negative troponins. She may be discharged from a cardiac standpoint. We will arrange an outpatient [place] nuclear study for risk stratification. If negative would recommend increasing her activities and weight loss to see if her symptoms improve. Note she did have a CTA that showed no pulmonary embolus.  Kirk Ruths, MD

## 2017-05-28 NOTE — Telephone Encounter (Signed)
Patient inquired about if she still need to keep her appt on 06/19/17 because she said that she was going to come in on the same day her husband had an appt on 07/27/17. Please f/u with patient.

## 2017-05-28 NOTE — Telephone Encounter (Signed)
[place]! Thank you.

## 2017-05-28 NOTE — ED Provider Notes (Signed)
[facility] DEPT Provider Note   CSN: [id number] Arrival date & time: 05/28/17  0439     History   Chief Complaint Chief Complaint  Patient presents with  . Chest Pain    HPI [name] is a 71 y.o. female.  Patient with a history of HTN, HLD, GERD presents with chest pain that started 2 days ago. She describes "achy" discomfort that started across chest and bilateral shoulders. These symptoms are intermittent. She complains of persistent discomfort to left chest and shoulder. No cough of fever. She reports she has been having a progressive exertional SOB for the past month. No diaphoresis, nausea or vomiting with current symptoms. She reports she sees a cardiologist every year and that her last catheterization was 2007 without finding.    The history is provided by the patient. No language interpreter was used.  Chest Pain   Associated symptoms include shortness of breath. Pertinent negatives include no abdominal pain, no cough, no diaphoresis, no fever, no nausea, no numbness, no palpitations, no vomiting and no weakness.    Past Medical History:  Diagnosis Date  . GERD (gastroesophageal reflux disease)   . HTN (hypertension)   . Hyperlipidemia   . Hypertension   . Normal cardiac stress test 2011   echo stress with normal LV function., Normal 2-D echo 2008  . Obesity   . 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 Morbid obesity ([place]) 06/16/2016  . Preoperative cardiovascular examination 06/16/2016  . Hyperlipidemia   . ANXIETY 03/14/2008  . DEPRESSION 03/14/2008  . Essential hypertension 03/14/2008  . GERD 03/14/2008  . ESOPHAGITIS, REFLUX 07/25/2007  . HIATAL HERNIA 07/25/2007  . DIVERTICULOSIS, COLON 02/07/2007    Past Surgical History:  Procedure Laterality Date  . ABDOMINAL HYSTERECTOMY  2001  . APPENDECTOMY  1957  . CARDIAC CATHETERIZATION  2007   Patent coronary arteries  . KNEE ARTHROSCOPY Right 1999  . LAPAROSCOPIC CHOLECYSTECTOMY  2007  . TONSILECTOMY/ADENOIDECTOMY WITH MYRINGOTOMY  1963  . TOTAL KNEE ARTHROPLASTY Right 07/21/2016   Procedure: RIGHT TOTAL KNEE ARTHROPLASTY;  Surgeon: Netta Cedars, MD;  Location: [facility];  Service: Orthopedics;  Laterality: Right;    OB History    No data available       Home Medications    Prior to Admission medications   Medication Sig Start Date End Date Taking? Authorizing Provider  amLODipine (NORVASC) 10 MG tablet Take 10 mg by mouth daily.   Yes [provider]  aspirin EC 81 MG tablet Take 81 mg by mouth daily.   Yes [provider]  b complex vitamins tablet Take 1 tablet by mouth daily.   Yes [provider]  Cholecalciferol (VITAMIN D-3) 1000 UNITS CAPS Take 2,000 Units by mouth daily.   Yes [provider]  diphenhydrAMINE (ALLERGY) 25 mg capsule Take 25 mg by mouth every 6 (six) hours as needed for allergies.    Yes [provider]  esomeprazole (NEXIUM) 40 MG capsule Take 40 mg by mouth daily as needed (for reflux).    Yes [provider]  FLAXSEED, LINSEED, Woods Take 1,000 mg by mouth daily.   Yes [provider]  KRILL OIL Woods Take 2 capsules by mouth daily.   Yes [provider]  Lecith-Inosi-Chol-B12-Liver (LIVERITE Woods) Take 2 tablets by mouth at bedtime.   Yes [provider]  OVER THE  COUNTER MEDICATION Take 1 tablet by mouth daily. 1000mg  milk thissel   Yes [provider]  quinapril (ACCUPRIL) 40 MG tablet Take 1 tablet (40 mg total) by mouth at bedtime. 07/17/16  Yes Croitoru, Mihai, MD  ranitidine (ZANTAC) 150 MG tablet Take 1 tablet (150 mg total) by mouth 2 (two) times daily. 03/13/17  Yes Esterwood, Amy S, PA-C  Turmeric Curcumin 500 MG CAPS Take 1 capsule by mouth daily.   Yes [provider]  vitamin C (ASCORBIC ACID) 500 MG tablet Take 500 mg by mouth daily.   Yes [provider]  methocarbamol (ROBAXIN) 500 MG tablet Take 1 tablet (500 mg total) by mouth 3 (three) times daily as needed. Patient not taking: Reported on 01/15/2017 07/21/16   Netta Cedars, MD    Family History Family History  Problem Relation Age of Onset  . Heart disease Mother 79  . Hypertension Mother   . Hyperlipidemia Mother   . Heart failure Father   . Colon cancer Father 98  . Sudden death Maternal Grandfather 37  . Sudden death Paternal Grandfather 55  . Colon cancer Sister 34  . Esophageal cancer Neg Hx   . Rectal cancer Neg Hx   . Stomach cancer Neg Hx     Social History Social History  Substance Use Topics  . Smoking status: Never Smoker  . Smokeless tobacco: Never Used  . Alcohol use No     Allergies   Statins; Losartan; and Penicillins   Review of Systems Review of Systems  Constitutional: Negative for chills, diaphoresis and fever.  Respiratory: Positive for shortness of breath. Negative for cough.   Cardiovascular: Positive for chest pain. Negative for palpitations and leg swelling.  Gastrointestinal: Negative.  Negative for abdominal pain, diarrhea, nausea and vomiting.  Musculoskeletal: Negative.  Negative for myalgias.  Skin: Negative.   Neurological: Negative.  Negative for weakness and numbness.     Physical Exam Updated Vital Signs BP 103/63   Pulse 73   Temp 98 F (36.7 C) (Oral)   Resp 10   Ht 5\' 6"  (1.676 m)   Wt 114.8 kg (253 lb)   SpO2 96%   BMI 40.84 kg/m   Physical Exam  Constitutional: She is oriented to person, place, and time. She appears well-developed and well-nourished.  HENT:  Head: Normocephalic.  Neck: Normal range of motion. Neck supple.  Cardiovascular: Normal rate and regular rhythm.   No murmur heard. No carotid bruit  Pulmonary/Chest: Effort normal and breath sounds normal.  Abdominal: Soft. Bowel sounds are normal. There is no tenderness. There is no rebound and no guarding.  Musculoskeletal: Normal  range of motion. She exhibits no edema.  Neurological: She is alert and oriented to person, place, and time.  Skin: Skin is warm and dry.  Psychiatric: She has a normal mood and affect.     ED Treatments / Results  Labs (all labs ordered are listed, but only abnormal results are displayed) Labs Reviewed  BASIC METABOLIC PANEL - Abnormal; Notable for the following:       Result Value   Glucose, Bld 113 (*)    GFR calc non Af Amer 58 (*)    All other components within normal limits  CBC - Abnormal; Notable for the following:    RBC 5.44 (*)    Hemoglobin 15.8 (*)    HCT 47.3 (*)    All other components within normal limits  I-STAT TROPOININ, ED    EKG  EKG  Interpretation  Date/Time:  Monday May 28 2017 04:46:19 EDT Ventricular Rate:  114 PR Interval:  188 QRS Duration: 78 QT Interval:  316 QTC Calculation: 435 R Axis:   -18 Text Interpretation:  Sinus tachycardia Left ventricular hypertrophy with repolarization abnormality Abnormal ECG Rate faster Confirmed by Ezequiel Essex [phone]) on 05/28/2017 5:18:22 AM       Radiology Dg Chest 2 View  Result Date: 05/28/2017 CLINICAL DATA:  Chest pain and shortness of breath. EXAM: CHEST  2 VIEW COMPARISON:  Radiograph 05/22/2017 FINDINGS: The cardiomediastinal contours are normal. The lungs are clear. Pulmonary vasculature is normal. No consolidation, pleural effusion, or pneumothorax. No acute osseous abnormalities are seen. IMPRESSION: No acute abnormality.  No change from prior exam. Electronically Signed   By: Jeb Levering M.D.   On: 05/28/2017 05:16    Procedures Procedures (including critical care time)  Medications Ordered in ED Medications - No data to display   Initial Impression / Assessment and Plan / ED Course  I have reviewed the triage vital signs and the nursing notes.  Pertinent labs & imaging results that were available during my care of the patient were reviewed by me and considered in my medical  decision making (see chart for details).     Patient with concerning chest pain for ACS. Initial troponin normal. No history Mi in the past.   D-dimer also elevated to 0.72. CTA negative for clots.   Discussed with cardiology who will see the patient in the ED for consultation.  Patient care signed out to [facility], PA-C, pending delta troponin and cardiology consultation.  Final Clinical Impressions(s) / ED Diagnoses   Final diagnoses:  None   1. Chest pain 2. DOE  New Prescriptions New Prescriptions   No medications on file     Charlann Lange, Hershal Coria 06/08/17 1275    Ezequiel Essex, MD 06/08/17 [phone]

## 2017-05-28 NOTE — Telephone Encounter (Signed)
Close encounter  Wrong provider  

## 2017-05-28 NOTE — ED Triage Notes (Signed)
Pt reports "all the symptoms of a panic attack" CP, L arm/ R shoulder pain and shortness of breath intermittently since Saturday.

## 2017-05-28 NOTE — ED Notes (Signed)
EDP at bedside  

## 2017-05-29 DIAGNOSIS — F418 Other specified anxiety disorders: Secondary | ICD-10-CM | POA: Diagnosis not present

## 2017-05-30 ENCOUNTER — Telehealth (HOSPITAL_COMMUNITY): Payer: Self-pay | Admitting: *Deleted

## 2017-05-30 NOTE — Telephone Encounter (Signed)
Patient given detailed instructions per Myocardial Perfusion Study Information Sheet for the test on 06/05/17. Patient notified to arrive 15 minutes early and that it is imperative to arrive on time for appointment to keep from having the test rescheduled.  If you need to cancel or reschedule your appointment, please call the office within 24 hours of your appointment. . Patient verbalized understanding. [name]

## 2017-06-04 ENCOUNTER — Ambulatory Visit (HOSPITAL_COMMUNITY): Payer: Medicare Other

## 2017-06-05 ENCOUNTER — Ambulatory Visit (HOSPITAL_COMMUNITY): Payer: Medicare Other | Attending: Cardiology

## 2017-06-05 DIAGNOSIS — R0789 Other chest pain: Secondary | ICD-10-CM | POA: Diagnosis not present

## 2017-06-05 DIAGNOSIS — R0609 Other forms of dyspnea: Secondary | ICD-10-CM

## 2017-06-05 DIAGNOSIS — R0600 Dyspnea, unspecified: Secondary | ICD-10-CM

## 2017-06-05 MED ORDER — TECHNETIUM TC 99M TETROFOSMIN IV KIT
33.0000 | PACK | Freq: Once | INTRAVENOUS | Status: AC | PRN
  Administered 2017-06-05: 33 via INTRAVENOUS
  Filled 2017-06-05: qty 33

## 2017-06-05 MED ORDER — REGADENOSON 0.4 MG/5ML IV SOLN
0.4000 mg | Freq: Once | INTRAVENOUS | Status: AC
  Administered 2017-06-05: 0.4 mg via INTRAVENOUS

## 2017-06-07 ENCOUNTER — Ambulatory Visit (HOSPITAL_COMMUNITY): Payer: Medicare Other | Attending: Cardiology

## 2017-06-07 LAB — MYOCARDIAL PERFUSION IMAGING
CHL CUP NUCLEAR SDS: 3
CHL CUP NUCLEAR SRS: 6
CHL CUP NUCLEAR SSS: 9
CSEPPHR: 129 {beats}/min
LHR: 0.3
LV sys vol: 33 mL
LVDIAVOL: 86 mL (ref 46–106)
Rest HR: 96 {beats}/min
TID: 0.85

## 2017-06-07 MED ORDER — TECHNETIUM TC 99M TETROFOSMIN IV KIT
32.0000 | PACK | Freq: Once | INTRAVENOUS | Status: AC | PRN
  Administered 2017-06-07: 32 via INTRAVENOUS
  Filled 2017-06-07: qty 32

## 2017-06-19 ENCOUNTER — Ambulatory Visit: Payer: Medicare Other | Admitting: Cardiovascular Disease

## 2017-06-29 ENCOUNTER — Ambulatory Visit: Payer: Medicare Other | Admitting: Registered"

## 2017-07-06 ENCOUNTER — Encounter: Payer: Self-pay | Admitting: Dietician

## 2017-07-06 ENCOUNTER — Encounter: Payer: Medicare Other | Attending: Family Medicine | Admitting: Dietician

## 2017-07-06 DIAGNOSIS — K219 Gastro-esophageal reflux disease without esophagitis: Secondary | ICD-10-CM

## 2017-07-06 DIAGNOSIS — E7800 Pure hypercholesterolemia, unspecified: Secondary | ICD-10-CM | POA: Insufficient documentation

## 2017-07-06 DIAGNOSIS — I10 Essential (primary) hypertension: Secondary | ICD-10-CM | POA: Insufficient documentation

## 2017-07-06 DIAGNOSIS — Z713 Dietary counseling and surveillance: Secondary | ICD-10-CM | POA: Diagnosis not present

## 2017-07-06 DIAGNOSIS — R7303 Prediabetes: Secondary | ICD-10-CM | POA: Diagnosis not present

## 2017-07-06 DIAGNOSIS — N289 Disorder of kidney and ureter, unspecified: Secondary | ICD-10-CM | POA: Insufficient documentation

## 2017-07-06 NOTE — Progress Notes (Signed)
Medical Nutrition Therapy:  Appt start time: 1430 end time:  4098.   Assessment:  Primary concerns today: Patient is here today alone.  She has been referred for renal insufficiency, prediabetes, obesity, and pure hypercholesterolemia.  She would like learn what to eat and how to eat, and how to reduce cravings (sugar and salt).  She lost 40 lbs in the past on Weight Watchers.  She woulld like to lose weight to help her joints, feel better, and be more active. Labs include:  A1C 5.4%, Cholesterol 164, Triglycerides 210, HDL 41, LDL 80, and EGFR 57.    Weight Hx:   Today 250 lbs Lowest adult weight:  175 lbs Highest adult weight   263 lbs Her goal is 200 lbs.  She is very large boned and BMI does not take into account body frame.  Patient lives with her husband.  He does the cooking and both shop.  They also eat out fast food frequently OR frozen meals. Her husband loves hotdogs but dislikes them.  He cooks a typical [employer]. He had CABG in 2010 but refuses to eat a heart healthy diet. He also feels hurt when she does not eat the food he prepares.  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    Preferred Learning Style:   No preference indicated   Learning Readiness:   Ready  MEDICATIONS: see list   DIETARY INTAKE:  Usual eating pattern includes 3 meals and 2-3 snacks per day.  24-hr recall:  B (8-9 AM): maple and brown sugar frosted mini wheats and whole milk OR scrambled eggs or omelet with cheese, grits, with toast or biscuits with jelly or butter, fruit, occasional bacon Snk ( AM): honey bun or oatmeal cookie occasionally L (1-2 PM): sandwich and chips Snk ( PM): chips or fudgesickle D (4:30-8 PM): fast food (hot dog or hamburger or hamburger steak, occasional fries or fried green beans Snk ( PM): fruit, chips, fudgesickle or ice cream  Beverages: water, 1/2 Pepsi daily, coffee in the winter with honey and whole milk, occasional sweet tea  Usual  physical activity: none currently but used to enjoy water classes at the [facility] prior to her knee surgery last year.  Her niece and husband have been saying that they were going to go but have not.    Progress Towards Goal(s):  In progress.   Nutritional Diagnosis:  NB-1.1 Food and nutrition-related knowledge deficit As related to mindful eating.  As evidenced by patient report and diet hx.    Intervention:  Nutrition counseling/education related to healthy eating and mindfulness.  Discussed label reading, portion size, healthy cooking.  Discussed choosing beverages without sugar most often.  Discussed eating out and the benefits of continued activity.  Discussed reducing sodium intake as well as tips to decrease symptoms of GERD and triglyceride levels.  Don't get discouraged by the numbers.  Continue with healthy choices for healthy lifestyle. Eat slowly.  Pay attention to how your food tastes, smells.  When are you satisfied? Be mindful of cooking method, amounts of added fats, portion size. Before a snack ask, "Am I hungry or eating for another reason?" Each day is a new day.   Aim to be more active.  Consider going back to the [facility].  Aim for 2-3 Carbohydrate choices (30-45 grams) per meal Choose 1 Carbohydrate choice (15 grams) per snack if hungry. Small amount protein with each meal or snack.    Teaching Method Utilized:  Visual Auditory Hands on  Handouts given during visit include:  Meal plan card  Snack list  Label reading  Barriers to learning/adherence to lifestyle change: husband is not as supportive  Demonstrated degree of understanding via:  Teach Back   Monitoring/Evaluation:  Dietary intake, exercise, label reading, and body weight prn.

## 2017-07-06 NOTE — Patient Instructions (Addendum)
Don't get discouraged by the numbers.  Continue with healthy choices for healthy lifestyle. Eat slowly.  Pay attention to how your food tastes, smells.  When are you satisfied? Be mindful of cooking method, amounts of added fats, portion size. Before a snack ask, "Am I hungry or eating for another reason?" Each day is a new day.   Aim to be more active.  Consider going back to the [facility].  Aim for 2-3 Carbohydrate choices (30-45 grams) per meal Choose 1 Carbohydrate choice (15 grams) per snack if hungry. Small amount protein with each meal or snack.

## 2017-07-23 ENCOUNTER — Encounter: Payer: Self-pay | Admitting: Cardiovascular Disease

## 2017-07-23 ENCOUNTER — Ambulatory Visit: Payer: Medicare Other | Admitting: Cardiovascular Disease

## 2017-07-23 ENCOUNTER — Ambulatory Visit (INDEPENDENT_AMBULATORY_CARE_PROVIDER_SITE_OTHER): Payer: Medicare Other | Admitting: Cardiovascular Disease

## 2017-07-23 VITALS — BP 136/78 | HR 98 | Ht 66.0 in | Wt 253.0 lb

## 2017-07-23 DIAGNOSIS — I10 Essential (primary) hypertension: Secondary | ICD-10-CM

## 2017-07-23 DIAGNOSIS — E782 Mixed hyperlipidemia: Secondary | ICD-10-CM

## 2017-07-23 NOTE — Patient Instructions (Signed)
Dr Croitoru recommends that you schedule a follow-up appointment in 12 months. You will receive a reminder letter in the mail two months in advance. If you don't receive a letter, please call our office to schedule the follow-up appointment.  If you need a refill on your cardiac medications before your next appointment, please call your pharmacy. 

## 2017-07-23 NOTE — Progress Notes (Signed)
Cardiology Office Note    Date:  07/23/2017   ID:  [name], DOB Feb 06, 1946, MRN [id number]  PCP:  Kelton Pillar, MD  Cardiologist:   Sanda Klein, MD   Chief Complaint  Patient presents with  . Follow-up    pt reports continue soboe    History of Present Illness:  [name] is a 71 y.o. female with obesity and associated hypertension and hyperlipidemia returning for follow-up. She remains frustrated about her inability to lose weight.   She reports having had a bad year. She underwent right total knee replacement without complications, but still feels that her ability to be physically active is limited. When she goes shopping at [facility] she has to stop halfway to rest on a rolling walker. She denies shortness of breath, but has fatigue. It sounds like her lower back gives her problems when she tries to walk longer distances. She also complains of numbness in the tips of her toes bilaterally. She does not have diabetes but has had problems with her back and has previous undergone physical therapy for low back pain.  She has minimal ankle swelling if she sits for long periods of time. Her cough has resolved. She denies angina, PND, orthopnea, syncope, palpitations or claudication.  In mid June she went to the emergency room with complaints of chest discomfort and tingling down her left arm. She underwent a nuclear stress test which was normal and a CT angiogram which excluded pulmonary embolism. The study was not geared to be a coronary CTA, but is remarkable for the paucity of calcium in her coronary arteries and aorta. Ultimately, it was felt likely that her symptoms are related to anxiety surrounding refinancing of her home. He has occasionally used a very low dose of alprazolam.  She blames her husband, [name] who is also our patient, for buying too many sweets as one of the major reasons why she is unable to lose weight  She has a strong family history of coronary  disease and a personal history of hypertension and hyperlipidemia and borderline diabetes mellitus. She has a previous stress test performed in 2008 that did not show signs of coronary disease and underwent cardiac catheterization in 2007 with angiographically patent coronary arteries without meaningful atherosclerosis. She has been intolerant to statin medications and fibrates due to severe myalgia.   Past Medical History:  Diagnosis Date  . GERD (gastroesophageal reflux disease)   . HTN (hypertension)   . Hyperlipidemia   . Hypertension   . Normal cardiac stress test 2011   echo stress with normal LV function., Normal 2-D echo 2008  . Obesity   . 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 ABDOMINAL HYSTERECTOMY  2001  . APPENDECTOMY  1957  . CARDIAC CATHETERIZATION  2007   Patent coronary arteries  . KNEE ARTHROSCOPY Right 1999  . LAPAROSCOPIC CHOLECYSTECTOMY  2007  . TONSILECTOMY/ADENOIDECTOMY WITH MYRINGOTOMY  1963  . TOTAL KNEE ARTHROPLASTY Right 07/21/2016   Procedure: RIGHT TOTAL KNEE ARTHROPLASTY;  Surgeon: Netta Cedars, MD;  Location: [place];  Service: Orthopedics;  Laterality: Right;    Current Medications: Outpatient Medications Prior to Visit  Medication Sig Dispense Refill  . amLODipine (NORVASC) 10 MG tablet Take 10 mg by mouth daily.    Marland Kitchen aspirin EC 81 MG tablet Take 81 mg by mouth daily.    Marland Kitchen b complex vitamins tablet Take 1 tablet by mouth  daily.    . Cholecalciferol (VITAMIN D-3) 1000 UNITS CAPS Take 2,000 Units by mouth daily.    . diphenhydrAMINE (ALLERGY) 25 mg capsule Take 25 mg by mouth every 6 (six) hours as needed for allergies.     Marland Kitchen esomeprazole (NEXIUM) 40 MG capsule Take 40 mg by mouth daily as needed (for reflux).     Marland Kitchen FLAXSEED, LINSEED, PO Take 1,000 mg by mouth daily.    Marland Kitchen KRILL OIL PO Take 2 capsules by mouth daily.    . Lecith-Inosi-Chol-B12-Liver (LIVERITE PO) Take 2 tablets by mouth at bedtime.     . methocarbamol (ROBAXIN) 500 MG tablet Take 1 tablet (500 mg total) by mouth 3 (three) times daily as needed. 60 tablet 1  . OVER THE COUNTER MEDICATION Take 1 tablet by mouth daily. 1000mg  milk thissel    . quinapril (ACCUPRIL) 40 MG tablet Take 1 tablet (40 mg total) by mouth at bedtime. 90 tablet 3  . ranitidine (ZANTAC) 150 MG tablet Take 1 tablet (150 mg total) by mouth 2 (two) times daily. 60 tablet 5  . Turmeric Curcumin 500 MG CAPS Take 1 capsule by mouth daily.    . vitamin C (ASCORBIC ACID) 500 MG tablet Take 500 mg by mouth daily.     No facility-administered medications prior to visit.      Allergies:   Statins; Ace inhibitors; Losartan; Tricor [fenofibrate]; and Penicillins   Social History   Social History  . Marital status: Married    Spouse name: N/A  . Number of children: N/A  . Years of education: N/A   Social History Main Topics  . Smoking status: Never Smoker  . Smokeless tobacco: Never Used  . Alcohol use No  . Drug use: No  . Sexual activity: Not Asked   Other Topics Concern  . None   Social History Narrative  . None     Family History:  The patient's family history includes Colon cancer (age of onset: 74) in her sister; Colon cancer (age of onset: 58) in her father; Heart disease (age of onset: 59) in her mother; Heart failure in her father; Hyperlipidemia in her mother; Hypertension in her mother; Sudden death (age of onset: 82) in her paternal grandfather; Sudden death (age of onset: 39) in her maternal grandfather.   ROS:   Please see the history of present illness.    ROS All other systems reviewed and are negative.   PHYSICAL EXAM:   VS:  BP 136/78   Pulse 98   Ht 5\' 6"  (1.676 m)   Wt 253 lb (114.8 kg)   BMI 40.84 kg/m    General: Alert, oriented x3, no distress. Morbidly obese Head: no evidence of trauma, PERRL, EOMI, no exophtalmos or lid lag, no myxedema, no xanthelasma; normal ears, nose and oropharynx Neck: normal jugular venous  pulsations and no hepatojugular reflux; brisk carotid pulses without delay and no carotid bruits Chest: clear to auscultation, no signs of consolidation by percussion or palpation, normal fremitus, symmetrical and full respiratory excursions Cardiovascular: normal position and quality of the apical impulse, regular rhythm, normal first and second heart sounds, no murmurs, rubs or gallops Abdomen: no tenderness or distention, no masses by palpation, no abnormal pulsatility or arterial bruits, normal bowel sounds, no hepatosplenomegaly Extremities: no clubbing, cyanosis or edema; 2+ radial, ulnar and brachial pulses bilaterally; 2+ right femoral, posterior tibial and dorsalis pedis pulses; 2+ left femoral, posterior tibial and dorsalis pedis pulses; no subclavian or femoral bruits Neurological:  grossly nonfocal Psych: euthymic mood, full affect  Wt Readings from Last 3 Encounters:  07/23/17 253 lb (114.8 kg)  07/06/17 250 lb (113.4 kg)  06/05/17 253 lb (114.8 kg)      Studies/Labs Reviewed:   EKG:  EKG is not ordered today.  The ekg ordered 05/28/17 demonstrates Mild sinus tachycardia, voltage in leads 1 and aVL for LVH, without other criteria for LVH. Normal repolarization pattern in my opinion  Recent Labs: Dr. Laurann Montana, May 2018 Total cholesterol 164, triglycerides 210, HDL 41, LDL 80, A1c 5.4%, creatinine 0.97   ASSESSMENT:    1. Essential hypertension   2. Mixed hyperlipidemia   3. Morbid obesity ([place])      PLAN:  In order of problems listed above:  1. HTN: Still on an ACE inhibitor without any residual cough. Adequate control. 2. HLP: Her lipid profile is better than it was a year ago, despite the fact that she is not taking any prescription lipid-lowering medications. 3. Morbid obesity: As always she is very frustrated with her inability to exercise it seems to be related mostly to orthopedic problems. Encouraged her to continue her efforts to exercise. Even if she doesn't  lose weight it would still be to her benefit.    Medication Adjustments/Labs and Tests Ordered: Current medicines are reviewed at length with the patient today.  Concerns regarding medicines are outlined above.  Medication changes, Labs and Tests ordered today are listed in the Patient Instructions below. Patient Instructions  Dr Sallyanne Kuster recommends that you schedule a follow-up appointment in 12 months. You will receive a reminder letter in the mail two months in advance. If you don't receive a letter, please call our office to schedule the follow-up appointment.  If you need a refill on your cardiac medications before your next appointment, please call your pharmacy.    Signed, Sanda Klein, MD  07/23/2017 8:43 AM    [facility] [place], [place],   [address] Phone: [phone]; Fax: [phone]

## 2017-07-26 DIAGNOSIS — H40013 Open angle with borderline findings, low risk, bilateral: Secondary | ICD-10-CM | POA: Diagnosis not present

## 2017-07-26 DIAGNOSIS — H2513 Age-related nuclear cataract, bilateral: Secondary | ICD-10-CM | POA: Diagnosis not present

## 2017-09-03 DIAGNOSIS — Z23 Encounter for immunization: Secondary | ICD-10-CM | POA: Diagnosis not present

## 2017-09-07 ENCOUNTER — Other Ambulatory Visit: Payer: Self-pay | Admitting: Cardiovascular Disease

## 2017-11-29 DIAGNOSIS — M1990 Unspecified osteoarthritis, unspecified site: Secondary | ICD-10-CM | POA: Diagnosis not present

## 2017-11-29 DIAGNOSIS — M8580 Other specified disorders of bone density and structure, unspecified site: Secondary | ICD-10-CM | POA: Diagnosis not present

## 2017-11-29 DIAGNOSIS — E7800 Pure hypercholesterolemia, unspecified: Secondary | ICD-10-CM | POA: Diagnosis not present

## 2017-11-29 DIAGNOSIS — K219 Gastro-esophageal reflux disease without esophagitis: Secondary | ICD-10-CM | POA: Diagnosis not present

## 2017-11-29 DIAGNOSIS — N183 Chronic kidney disease, stage 3 (moderate): Secondary | ICD-10-CM | POA: Diagnosis not present

## 2017-11-29 DIAGNOSIS — Z0000 Encounter for general adult medical examination without abnormal findings: Secondary | ICD-10-CM | POA: Diagnosis not present

## 2017-11-29 DIAGNOSIS — I129 Hypertensive chronic kidney disease with stage 1 through stage 4 chronic kidney disease, or unspecified chronic kidney disease: Secondary | ICD-10-CM | POA: Diagnosis not present

## 2017-11-29 DIAGNOSIS — E669 Obesity, unspecified: Secondary | ICD-10-CM | POA: Diagnosis not present

## 2017-11-29 DIAGNOSIS — F329 Major depressive disorder, single episode, unspecified: Secondary | ICD-10-CM | POA: Diagnosis not present

## 2017-11-29 DIAGNOSIS — M5430 Sciatica, unspecified side: Secondary | ICD-10-CM | POA: Diagnosis not present

## 2017-12-07 ENCOUNTER — Other Ambulatory Visit: Payer: Self-pay | Admitting: Family Medicine

## 2017-12-07 DIAGNOSIS — Z1231 Encounter for screening mammogram for malignant neoplasm of breast: Secondary | ICD-10-CM

## 2017-12-12 DIAGNOSIS — Z96651 Presence of right artificial knee joint: Secondary | ICD-10-CM | POA: Diagnosis not present

## 2017-12-12 DIAGNOSIS — Z96659 Presence of unspecified artificial knee joint: Secondary | ICD-10-CM | POA: Diagnosis not present

## 2017-12-12 DIAGNOSIS — M25561 Pain in right knee: Secondary | ICD-10-CM | POA: Diagnosis not present

## 2017-12-31 ENCOUNTER — Ambulatory Visit
Admission: RE | Admit: 2017-12-31 | Discharge: 2017-12-31 | Disposition: A | Discharge status: Home / Self Care | Payer: Medicare Other | Source: Ambulatory Visit | Attending: Family Medicine | Admitting: Family Medicine

## 2017-12-31 DIAGNOSIS — Z1231 Encounter for screening mammogram for malignant neoplasm of breast: Secondary | ICD-10-CM

## 2018-03-22 DIAGNOSIS — L720 Epidermal cyst: Secondary | ICD-10-CM | POA: Diagnosis not present

## 2018-03-22 DIAGNOSIS — L821 Other seborrheic keratosis: Secondary | ICD-10-CM | POA: Diagnosis not present

## 2018-03-22 DIAGNOSIS — D225 Melanocytic nevi of trunk: Secondary | ICD-10-CM | POA: Diagnosis not present

## 2018-03-22 DIAGNOSIS — D1801 Hemangioma of skin and subcutaneous tissue: Secondary | ICD-10-CM | POA: Diagnosis not present

## 2018-03-22 DIAGNOSIS — L738 Other specified follicular disorders: Secondary | ICD-10-CM | POA: Diagnosis not present

## 2018-03-22 DIAGNOSIS — D2372 Other benign neoplasm of skin of left lower limb, including hip: Secondary | ICD-10-CM | POA: Diagnosis not present

## 2018-03-22 DIAGNOSIS — D224 Melanocytic nevi of scalp and neck: Secondary | ICD-10-CM | POA: Diagnosis not present

## 2018-03-22 DIAGNOSIS — Z85828 Personal history of other malignant neoplasm of skin: Secondary | ICD-10-CM | POA: Diagnosis not present

## 2018-04-09 DIAGNOSIS — R51 Headache: Secondary | ICD-10-CM | POA: Diagnosis not present

## 2018-04-09 DIAGNOSIS — K219 Gastro-esophageal reflux disease without esophagitis: Secondary | ICD-10-CM | POA: Diagnosis not present

## 2018-04-11 ENCOUNTER — Other Ambulatory Visit (HOSPITAL_COMMUNITY): Payer: Self-pay | Admitting: Family Medicine

## 2018-04-11 ENCOUNTER — Ambulatory Visit (HOSPITAL_COMMUNITY)
Admission: RE | Admit: 2018-04-11 | Discharge: 2018-04-11 | Disposition: A | Discharge status: Home / Self Care | Payer: Medicare Other | Source: Ambulatory Visit | Attending: Family Medicine | Admitting: Family Medicine

## 2018-04-11 DIAGNOSIS — G529 Cranial nerve disorder, unspecified: Secondary | ICD-10-CM | POA: Insufficient documentation

## 2018-04-11 DIAGNOSIS — H5712 Ocular pain, left eye: Secondary | ICD-10-CM | POA: Diagnosis not present

## 2018-04-11 DIAGNOSIS — R51 Headache: Principal | ICD-10-CM

## 2018-04-11 DIAGNOSIS — R519 Headache, unspecified: Secondary | ICD-10-CM

## 2018-04-11 LAB — POCT I-STAT CREATININE: Creatinine, Ser: 1 mg/dL (ref 0.44–1.00)

## 2018-04-11 MED ORDER — GADOBENATE DIMEGLUMINE 529 MG/ML IV SOLN
20.0000 mL | Freq: Once | INTRAVENOUS | Status: AC | PRN
  Administered 2018-04-11: 20 mL via INTRAVENOUS

## 2018-04-17 ENCOUNTER — Encounter: Payer: Self-pay | Admitting: Neurology

## 2018-04-17 ENCOUNTER — Telehealth: Payer: Self-pay | Admitting: Neurology

## 2018-04-17 ENCOUNTER — Ambulatory Visit (INDEPENDENT_AMBULATORY_CARE_PROVIDER_SITE_OTHER): Payer: Medicare Other | Admitting: Neurology

## 2018-04-17 ENCOUNTER — Encounter: Payer: Self-pay | Admitting: *Deleted

## 2018-04-17 VITALS — BP 150/87 | HR 99 | Ht 66.0 in | Wt 250.0 lb

## 2018-04-17 DIAGNOSIS — H4922 Sixth [abducent] nerve palsy, left eye: Secondary | ICD-10-CM

## 2018-04-17 DIAGNOSIS — H532 Diplopia: Secondary | ICD-10-CM

## 2018-04-17 MED ORDER — CLOPIDOGREL BISULFATE 75 MG PO TABS: 75.0000 mg | ORAL_TABLET | Freq: Every day | ORAL | 11 refills | Status: DC

## 2018-04-17 NOTE — Telephone Encounter (Signed)
She is aware of Dr. Rhea Belton response.  She verbalized understanding.  She is going to stop using Nexium and only use Zantac as needed.

## 2018-04-17 NOTE — Telephone Encounter (Signed)
Please call patient, Nexium  is not(PPI) the absolute contraindication for clopidogrel,  If she has no significant GI symptoms, she should not stay on Nexium long-term,  If needed, 75 mg twice a day as needed may consider switch to Zantac for acid reflux,

## 2018-04-17 NOTE — Progress Notes (Signed)
PATIENT: [name] DOB: 05-09-1946  Chief Complaint  Patient presents with  . Headache    She is here with her husband, [name].  Reports waking up with the worst headache of her life on 03/30/18.  The pain was persistent for six days then resolved.  She has continued to have double vision (only at a distance) since this event.  She had a negative MRI brain on 04/11/18.  Marland Kitchen PCP    Kelton Pillar, MD     HISTORICAL  [name] is a 72 year old female, seen in refer by her primary care doctor Kelton Pillar for evaluation of headaches, double vision, initial evaluation was on Apr 17, 2018.  I reviewed and summarized the referring note, she has past medical history of hypertension, prediabetes, hyperlipidemia, but intolerant of statins, diverticulitis, acid reflux, strong family history of colon cancer,  She woke up on March 30, 2018 noticed holoacranial moderate to severe pressure headaches, lasted for 5 days, she has to take frequent dose of Tylenol, resting, and her headache gradually improved, but about 5 days later, she noticed double vision, worse directions looking to far left, she can still read, using her cell phone without much difficulty.  She did have a history of hypertension, but not compliant with her amlodipine due to bilateral lower extremity edema,  Over the past few weeks, her double vision still present, she no longer has headaches,  MRI of the brain without contrast on Apr 11, 2018 was normal   REVIEW OF SYSTEMS: Full 14 system review of systems performed and notable only for as above  ALLERGIES: Allergies  Allergen Reactions  . Statins Swelling and Other (See Comments)    MYALGIAS SWELLING OF LEGS  . Ace Inhibitors   . Losartan     sick  . Tricor [Fenofibrate]   . Penicillins Rash    Has patient had a PCN reaction causing immediate rash, facial/tongue/throat swelling, SOB or lightheadedness with hypotension: no Has patient had a PCN reaction  causing severe rash involving mucus membranes or skin necrosis: no Has patient had a PCN reaction that required hospitalization no Has patient had a PCN reaction occurring within the last 10 years: no If all of the above answers are "NO", then may proceed with Cephalosporin use.    HOME MEDICATIONS: Current Outpatient Medications  Medication Sig Dispense Refill  . ALPRAZolam (XANAX) 0.25 MG tablet Take 0.25 mg by mouth daily as needed.  0  . amLODipine (NORVASC) 10 MG tablet Take 10 mg by mouth daily.    Marland Kitchen aspirin EC 81 MG tablet Take 81 mg by mouth daily.    Marland Kitchen b complex vitamins tablet Take 1 tablet by mouth daily.    . Cholecalciferol (VITAMIN D-3) 1000 UNITS CAPS Take 2,000 Units by mouth daily.    . diphenhydrAMINE (ALLERGY) 25 mg capsule Take 25 mg by mouth every 6 (six) hours as needed for allergies.     Marland Kitchen esomeprazole (NEXIUM) 40 MG capsule Take 40 mg by mouth daily as needed (for reflux).     Marland Kitchen FLAXSEED, LINSEED, PO Take 1,000 mg by mouth daily.    Marland Kitchen KRILL OIL PO Take 2 capsules by mouth daily.    . Lecith-Inosi-Chol-B12-Liver (LIVERITE PO) Take 2 tablets by mouth at bedtime.    . methocarbamol (ROBAXIN) 500 MG tablet Take 1 tablet (500 mg total) by mouth 3 (three) times daily as needed. 60 tablet 1  . OVER THE COUNTER MEDICATION Take 1 tablet by  mouth daily. 1000mg  milk thissel    . quinapril (ACCUPRIL) 40 MG tablet TAKE 1 TABLET (40 MG TOTAL) BY MOUTH AT BEDTIME. 90 tablet 2  . ranitidine (ZANTAC) 150 MG tablet Take 1 tablet (150 mg total) by mouth 2 (two) times daily. 60 tablet 5  . Turmeric Curcumin 500 MG CAPS Take 1 capsule by mouth daily.    . vitamin C (ASCORBIC ACID) 500 MG tablet Take 500 mg by mouth daily.     No current facility-administered medications for this visit.     PAST MEDICAL HISTORY: Past Medical History:  Diagnosis Date  . Diplopia   . GERD (gastroesophageal reflux disease)   . HTN (hypertension)   . Hyperlipidemia   . Hypertension   . Normal  cardiac stress test 03-29-10   echo stress with normal LV function., Normal 2-D echo March 30, 2007  . Obesity   . 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 ABDOMINAL HYSTERECTOMY  03/29/00  . APPENDECTOMY  1957  . CARDIAC CATHETERIZATION  03-29-2006   Patent coronary arteries  . KNEE ARTHROSCOPY Right 1999  . LAPAROSCOPIC CHOLECYSTECTOMY  03-29-06  . TONSILECTOMY/ADENOIDECTOMY WITH MYRINGOTOMY  03/29/1962  . TOTAL KNEE ARTHROPLASTY Right 07/21/2016   Procedure: RIGHT TOTAL KNEE ARTHROPLASTY;  Surgeon: Netta Cedars, MD;  Location: [facility];  Service: Orthopedics;  Laterality: Right;    FAMILY HISTORY: Family History  Problem Relation Age of Onset  . Heart disease Mother 80  . Hypertension Mother   . Hyperlipidemia Mother   . Dementia Mother   . Colon cancer Father 36  . Hypertension Father   . Prostate cancer Father   . Sudden death Maternal Grandfather 03/29/74       cerebral hemorrhage  . Sudden death Paternal Grandfather 11  . Colon cancer Sister 68  . Esophageal cancer Neg Hx   . Rectal cancer Neg Hx   . Stomach cancer Neg Hx     SOCIAL HISTORY:  Social History   Socioeconomic History  . Marital status: Married    Spouse name: Not on file  . Number of children: 1  . Years of education: 60  . Highest education level: Bachelor's degree (e.g., BA, AB, BS)  Occupational History  . Occupation: Retired  Scientific laboratory technician  . Financial resource strain: Not on file  . Food insecurity:    Worry: Not on file    Inability: Not on file  . Transportation needs:    Medical: Not on file    Non-medical: Not on file  Tobacco Use  . Smoking status: Never Smoker  . Smokeless tobacco: Never Used  Substance and Sexual Activity  . Alcohol use: No  . Drug use: No  . Sexual activity: Not on file  Lifestyle  . Physical activity:    Days per week: Not on file    Minutes per session: Not on file  . Stress: Not on file  Relationships  . Social  connections:    Talks on phone: Not on file    Gets together: Not on file    Attends religious service: Not on file    Active member of club or organization: Not on file    Attends meetings of clubs or organizations: Not on file    Relationship status: Not on file  . Intimate partner violence:    Fear of current or ex partner: Not on file    Emotionally abused:  Not on file    Physically abused: Not on file    Forced sexual activity: Not on file  Other Topics Concern  . Not on file  Social History Narrative   Lives at home with her husband.   Right-handed.   1-2 cups caffeine per day.     PHYSICAL EXAM   Vitals:   04/17/18 0835  BP: (!) 150/87  Pulse: 99  Weight: 250 lb (113.4 kg)  Height: 5\' 6"  (1.676 m)    Not recorded      Body mass index is 40.35 kg/m.  PHYSICAL EXAMNIATION:  Gen: NAD, conversant, well nourised, obese, well groomed                     Cardiovascular: Regular rate rhythm, no peripheral edema, warm, nontender. Eyes: Conjunctivae clear without exudates or hemorrhage Neck: Supple, no carotid bruits. Pulmonary: Clear to auscultation bilaterally   NEUROLOGICAL EXAM:  MENTAL STATUS: Speech:    Speech is normal; fluent and spontaneous with normal comprehension.  Cognition:     Orientation to time, place and person     Normal recent and remote memory     Normal Attention span and concentration     Normal Language, naming, repeating,spontaneous speech     Fund of knowledge   CRANIAL NERVES: CN II: Visual fields are full to confrontation. Fundoscopic exam is normal with sharp discs and no vascular changes. Pupils are round equal and briskly reactive to light. CN III, IV, VI: No ptosis, left lateral rectus muscle weakness,. CN V: Facial sensation is intact to pinprick in all 3 divisions bilaterally. Corneal responses are intact.  CN VII: Face is symmetric with normal eye closure and smile. CN VIII: Hearing is normal to rubbing fingers CN IX, X:  Palate elevates symmetrically. Phonation is normal. CN XI: Head turning and shoulder shrug are intact CN XII: Tongue is midline with normal movements and no atrophy.  MOTOR: There is no pronator drift of out-stretched arms. Muscle bulk and tone are normal. Muscle strength is normal.  REFLEXES: Reflexes are 2+ and symmetric at the biceps, triceps, knees, and ankles. Plantar responses are flexor.  SENSORY: Intact to light touch, pinprick, positional sensation and vibratory sensation are intact in fingers and toes.  COORDINATION: Rapid alternating movements and fine finger movements are intact. There is no dysmetria on finger-to-nose and heel-knee-shin.    GAIT/STANCE: Posture is normal. Gait is steady with normal steps, base, arm swing, and turning. Heel and toe walking are normal. Tandem gait is normal.  Romberg is absent.   DIAGNOSTIC DATA (LABS, IMAGING, TESTING) - I reviewed patient records, labs, notes, testing and imaging myself where available.   ASSESSMENT AND PLAN  VERDA MEHTA is a 72 y.o. female   Left 6th nerve palsy  MRI of the brain showed no significant abnormality,  Proceed with MRA of the brain and neck,  Echocardiogram  Laboratory evaluations,  This happened when she is already taking aspirin 81 mg daily, will switch to Plavix 75 mg daily  Marcial Pacas, M.D. Ph.D.  [facility] [address] [place], [place] [address] Ph: [phone] Fax: [phone]  CC: Kelton Pillar, MD

## 2018-04-17 NOTE — Addendum Note (Signed)
Addended by: Noberto Retort C on: 04/17/2018 03:53 PM   Modules accepted: Orders

## 2018-04-17 NOTE — Telephone Encounter (Signed)
At [facility], patient states her pharmacy called her and stated that Clopidogrel, conflicts with her Nexium (generic brand) that she already takes. She states during her visit with Dr. Krista Blue, an alternative medication was discussed if the new medication conflicted with her Nexium (generic brand). Patient has requested for the medication to be changed to the alternative.

## 2018-04-17 NOTE — Telephone Encounter (Signed)
Medicare/BCBS fed order sent to GI. They will reach out to the pt to schedule.  °

## 2018-04-18 ENCOUNTER — Telehealth: Payer: Self-pay | Admitting: Neurology

## 2018-04-18 DIAGNOSIS — H4922 Sixth [abducent] nerve palsy, left eye: Secondary | ICD-10-CM | POA: Insufficient documentation

## 2018-04-18 LAB — ANA W/REFLEX IF POSITIVE: Anti Nuclear Antibody(ANA): NEGATIVE

## 2018-04-18 LAB — VITAMIN B12: VITAMIN B 12: 748 pg/mL (ref 232–1245)

## 2018-04-18 LAB — SEDIMENTATION RATE: Sed Rate: 9 mm/hr (ref 0–40)

## 2018-04-18 LAB — TSH: TSH: 0.95 u[IU]/mL (ref 0.450–4.500)

## 2018-04-18 LAB — FOLATE: Folate: 16.3 ng/mL (ref >3.0)

## 2018-04-18 LAB — HGB A1C W/O EAG: Hgb A1c MFr Bld: 5.3 % (ref 4.8–5.6)

## 2018-04-18 LAB — C-REACTIVE PROTEIN: CRP: 11.1 mg/L — ABNORMAL HIGH (ref 0.0–4.9)

## 2018-04-18 LAB — RPR: RPR Ser Ql: NONREACTIVE

## 2018-04-18 MED ORDER — ALPRAZOLAM 1 MG PO TABS: ORAL_TABLET | ORAL | 0 refills | Status: DC

## 2018-04-18 NOTE — Telephone Encounter (Signed)
Laboratory evaluation showed mild elevated C-reactive protein, of unknown clinical significance,  Rest of the laboratory evaluations were normal

## 2018-04-18 NOTE — Telephone Encounter (Signed)
Per vo by Dr. Krista Blue, ok to provide Xanax per MRI protocol  Rx sent to pharmacy for patient with detailed instructions.

## 2018-04-18 NOTE — Telephone Encounter (Signed)
She is aware of lab results.

## 2018-04-18 NOTE — Addendum Note (Signed)
Addended by: Noberto Retort C on: 04/18/2018 04:06 PM   Modules accepted: Orders

## 2018-04-18 NOTE — Telephone Encounter (Signed)
Per vo by Dr. Krista Blue, okay to provide Xanax per MRI protocol.  Rx sent to pharmacy.  Pt aware it has been sent and that she must have a driver when she takes this medication.  She verbalized understanding.

## 2018-04-18 NOTE — Telephone Encounter (Signed)
Pt is asking if ALPRAZolam Duanne Moron) Can be called in for her upcoming MRI, if so please send to  [id number] [address], [place]. [phone] (Phone) [phone] (Fax)

## 2018-04-30 ENCOUNTER — Ambulatory Visit
Admission: RE | Admit: 2018-04-30 | Discharge: 2018-04-30 | Disposition: A | Discharge status: Home / Self Care | Payer: Medicare Other | Source: Ambulatory Visit | Attending: Neurology | Admitting: Neurology

## 2018-04-30 DIAGNOSIS — H532 Diplopia: Secondary | ICD-10-CM

## 2018-04-30 MED ORDER — GADOBENATE DIMEGLUMINE 529 MG/ML IV SOLN
20.0000 mL | Freq: Once | INTRAVENOUS | Status: AC | PRN
  Administered 2018-04-30: 20 mL via INTRAVENOUS

## 2018-05-30 DIAGNOSIS — G529 Cranial nerve disorder, unspecified: Secondary | ICD-10-CM | POA: Diagnosis not present

## 2018-05-30 DIAGNOSIS — I129 Hypertensive chronic kidney disease with stage 1 through stage 4 chronic kidney disease, or unspecified chronic kidney disease: Secondary | ICD-10-CM | POA: Diagnosis not present

## 2018-05-30 DIAGNOSIS — N183 Chronic kidney disease, stage 3 (moderate): Secondary | ICD-10-CM | POA: Diagnosis not present

## 2018-05-30 DIAGNOSIS — E7800 Pure hypercholesterolemia, unspecified: Secondary | ICD-10-CM | POA: Diagnosis not present

## 2018-05-30 DIAGNOSIS — F411 Generalized anxiety disorder: Secondary | ICD-10-CM | POA: Diagnosis not present

## 2018-05-30 DIAGNOSIS — R7309 Other abnormal glucose: Secondary | ICD-10-CM | POA: Diagnosis not present

## 2018-06-14 ENCOUNTER — Other Ambulatory Visit: Payer: Self-pay | Admitting: Cardiovascular Disease

## 2018-07-29 DIAGNOSIS — H538 Other visual disturbances: Secondary | ICD-10-CM | POA: Diagnosis not present

## 2018-07-29 DIAGNOSIS — H2512 Age-related nuclear cataract, left eye: Secondary | ICD-10-CM | POA: Diagnosis not present

## 2018-07-29 DIAGNOSIS — H5203 Hypermetropia, bilateral: Secondary | ICD-10-CM | POA: Diagnosis not present

## 2018-07-29 DIAGNOSIS — H52203 Unspecified astigmatism, bilateral: Secondary | ICD-10-CM | POA: Diagnosis not present

## 2018-07-29 DIAGNOSIS — H524 Presbyopia: Secondary | ICD-10-CM | POA: Diagnosis not present

## 2018-07-29 DIAGNOSIS — H2513 Age-related nuclear cataract, bilateral: Secondary | ICD-10-CM | POA: Diagnosis not present

## 2018-08-05 ENCOUNTER — Ambulatory Visit (INDEPENDENT_AMBULATORY_CARE_PROVIDER_SITE_OTHER): Payer: Medicare Other | Admitting: Neurology

## 2018-08-05 ENCOUNTER — Encounter: Payer: Self-pay | Admitting: Neurology

## 2018-08-05 VITALS — BP 138/86 | HR 92 | Ht 66.0 in | Wt 248.8 lb

## 2018-08-05 DIAGNOSIS — H4922 Sixth [abducent] nerve palsy, left eye: Secondary | ICD-10-CM | POA: Diagnosis not present

## 2018-08-05 NOTE — Progress Notes (Signed)
PATIENT: [name] DOB: 1946-06-25  Chief Complaint  Patient presents with  . Left 6th nerve palsy    Feels her vision is better.  She just found out that she needs bilataral cataract surgery.  She would like to review her MRA head, MRA neck, and labs.  She has not had her ECHO yet.  She has a pending appt with Dr. Sallyanne Kuster at [facility] on 08/08/18.     HISTORICAL  [name] is a 72 year old female, seen in refer by her primary care doctor Kelton Pillar for evaluation of headaches, double vision, initial evaluation was on Apr 17, 2018.  I reviewed and summarized the referring note, she has past medical history of hypertension, prediabetes, hyperlipidemia, but intolerant of statins, diverticulitis, acid reflux, strong family history of colon cancer,  She woke up on March 30, 2018 noticed holoacranial moderate to severe pressure headaches, lasted for 5 days, she has to take frequent dose of Tylenol, resting, and her headache gradually improved, but about 5 days later, she noticed double vision, worse directions looking to far left, she can still read, using her cell phone without much difficulty.  She did have a history of hypertension, but not compliant with her amlodipine due to bilateral lower extremity edema,  Over the past few weeks, her double vision still present, she no longer has headaches,  MRI of the brain without contrast on Apr 11, 2018 was normal  UPDATE August 05 2018: Left double vision quickly improved since June 2019, she denies droopy eyelid, no muscle weakness, she was seen by her ophthalmologist Dr. Rutherford Guys, MD on July 29, 2018, is planning to have cataract surgery, gait abnormality due to bilateral knee pain,  We personally reviewed MRA of the brain and neck, there was no significant large vessel disease.  Laboratory evaluation showed normal negative R15, RPR, TSH, folic acid, ESR, ANA, Q0G of 5.3, C-reactive protein was mildly  elevated at 11.1  Echocardiogram is pending   REVIEW OF SYSTEMS: Full 14 system review of systems performed and notable only for as above  ALLERGIES: Allergies  Allergen Reactions  . Statins Swelling and Other (See Comments)    MYALGIAS SWELLING OF LEGS  . Ace Inhibitors   . Losartan     sick  . Tricor [Fenofibrate]   . Penicillins Rash    Has patient had a PCN reaction causing immediate rash, facial/tongue/throat swelling, SOB or lightheadedness with hypotension: no Has patient had a PCN reaction causing severe rash involving mucus membranes or skin necrosis: no Has patient had a PCN reaction that required hospitalization no Has patient had a PCN reaction occurring within the last 10 years: no If all of the above answers are "NO", then may proceed with Cephalosporin use.    HOME MEDICATIONS: Current Outpatient Medications  Medication Sig Dispense Refill  . ALPRAZolam (XANAX) 1 MG tablet Take 1-2 tablets thirty minutes prior to MRI.  May take one additional tablet before entering scanner, if needed.  MUST HAVE DRIVER. 3 tablet 0  . amLODipine (NORVASC) 10 MG tablet Take 10 mg by mouth daily.    Marland Kitchen b complex vitamins tablet Take 1 tablet by mouth daily.    . Cholecalciferol (VITAMIN D-3) 1000 UNITS CAPS Take 2,000 Units by mouth daily.    . clopidogrel (PLAVIX) 75 MG tablet Take 1 tablet (75 mg total) by mouth daily. 30 tablet 11  . diphenhydrAMINE (ALLERGY) 25 mg capsule Take 25 mg by mouth every 6 (six)  hours as needed for allergies.     Marland Kitchen FLAXSEED, LINSEED, PO Take 1,000 mg by mouth daily.    Marland Kitchen KRILL OIL PO Take 2 capsules by mouth daily.    . Lecith-Inosi-Chol-B12-Liver (LIVERITE PO) Take 2 tablets by mouth at bedtime.    . methocarbamol (ROBAXIN) 500 MG tablet Take 1 tablet (500 mg total) by mouth 3 (three) times daily as needed. 60 tablet 1  . OVER THE COUNTER MEDICATION Take 1 tablet by mouth daily. 1051m milk thissel    . quinapril (ACCUPRIL) 40 MG tablet TAKE 1 TABLET BY  MOUTH EVERYDAY AT BEDTIME 90 tablet 0  . ranitidine (ZANTAC) 75 MG tablet Take 75 mg by mouth 2 (two) times daily as needed for heartburn.    . sertraline (ZOLOFT) 25 MG tablet Take 25 mg by mouth daily.    . Turmeric Curcumin 500 MG CAPS Take 1 capsule by mouth daily.    . vitamin C (ASCORBIC ACID) 500 MG tablet Take 500 mg by mouth daily.     No current facility-administered medications for this visit.     PAST MEDICAL HISTORY: Past Medical History:  Diagnosis Date  . Diplopia   . GERD (gastroesophageal reflux disease)   . HTN (hypertension)   . Hyperlipidemia   . Hypertension   . Normal cardiac stress test 203/19/2011  echo stress with normal LV function., Normal 2-D echo 22008-03-18 . Obesity   . 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 ABDOMINAL HYSTERECTOMY  2March 19, 2001 . APPENDECTOMY  1957  . CARDIAC CATHETERIZATION  22007/03/19  Patent coronary arteries  . KNEE ARTHROSCOPY Right 1999  . LAPAROSCOPIC CHOLECYSTECTOMY  219-Mar-2007 . TONSILECTOMY/ADENOIDECTOMY WITH MYRINGOTOMY  119-Mar-1963 . TOTAL KNEE ARTHROPLASTY Right 07/21/2016   Procedure: RIGHT TOTAL KNEE ARTHROPLASTY;  Surgeon: SNetta Cedars MD;  Location[place] [facility]  Service: Orthopedics;  Laterality: Right;    FAMILY HISTORY: Family History  Problem Relation Age of Onset  . Heart disease Mother 565 . Hypertension Mother   . Hyperlipidemia Mother   . Dementia Mother   . Colon cancer Father 650 . Hypertension Father   . Prostate cancer Father   . Sudden death Maternal Grandfather 71975/03/19      cerebral hemorrhage  . Sudden death Paternal Grandfather 539 . Colon cancer Sister 583 . Esophageal cancer Neg Hx   . Rectal cancer Neg Hx   . Stomach cancer Neg Hx     SOCIAL HISTORY:  Social History   Socioeconomic History  . Marital status: Married    Spouse name: Not on file  . Number of children: 1  . Years of education: 155 . Highest education level: Bachelor's degree  (e.g., BA, AB, BS)  Occupational History  . Occupation: Retired  SScientific laboratory technician . Financial resource strain: Not on file  . Food insecurity:    Worry: Not on file    Inability: Not on file  . Transportation needs:    Medical: Not on file    Non-medical: Not on file  Tobacco Use  . Smoking status: Never Smoker  . Smokeless tobacco: Never Used  Substance and Sexual Activity  . Alcohol use: No  . Drug use: No  . Sexual activity: Not on file  Lifestyle  . Physical activity:    Days per week: Not on file    Minutes per session:  Not on file  . Stress: Not on file  Relationships  . Social connections:    Talks on phone: Not on file    Gets together: Not on file    Attends religious service: Not on file    Active member of club or organization: Not on file    Attends meetings of clubs or organizations: Not on file    Relationship status: Not on file  . Intimate partner violence:    Fear of current or ex partner: Not on file    Emotionally abused: Not on file    Physically abused: Not on file    Forced sexual activity: Not on file  Other Topics Concern  . Not on file  Social History Narrative   Lives at home with her husband.   Right-handed.   1-2 cups caffeine per day.     PHYSICAL EXAM   Vitals:   08/05/18 1258  BP: 138/86  Pulse: 92  Weight: 248 lb 12 oz (112.8 kg)  Height: '5\' 6"'  (1.676 m)    Not recorded      Body mass index is 40.15 kg/m.  PHYSICAL EXAMNIATION:  Gen: NAD, conversant, well nourised, obese, well groomed                     Cardiovascular: Regular rate rhythm, no peripheral edema, warm, nontender. Eyes: Conjunctivae clear without exudates or hemorrhage Neck: Supple, no carotid bruits. Pulmonary: Clear to auscultation bilaterally   NEUROLOGICAL EXAM:  MENTAL STATUS: Speech:    Speech is normal; fluent and spontaneous with normal comprehension.  Cognition:     Orientation to time, place and person     Normal recent and remote memory      Normal Attention span and concentration     Normal Language, naming, repeating,spontaneous speech     Fund of knowledge   CRANIAL NERVES: CN II: Visual fields are full to confrontation. Fundoscopic exam is normal with sharp discs and no vascular changes. Pupils are round equal and briskly reactive to light. CN III, IV, VI: No ptosis, left lateral rectus muscle weakness,. CN V: Facial sensation is intact to pinprick in all 3 divisions bilaterally. Corneal responses are intact.  CN VII: Face is symmetric with normal eye closure and smile. CN VIII: Hearing is normal to rubbing fingers CN IX, X: Palate elevates symmetrically. Phonation is normal. CN XI: Head turning and shoulder shrug are intact CN XII: Tongue is midline with normal movements and no atrophy.  MOTOR: There is no pronator drift of out-stretched arms. Muscle bulk and tone are normal. Muscle strength is normal.  REFLEXES: Reflexes are 2+ and symmetric at the biceps, triceps, knees, and ankles. Plantar responses are flexor.  SENSORY: Intact to light touch, pinprick, positional sensation and vibratory sensation are intact in fingers and toes.  COORDINATION: Rapid alternating movements and fine finger movements are intact. There is no dysmetria on finger-to-nose and heel-knee-shin.    GAIT/STANCE: Posture is normal. Gait is steady with normal steps, base, arm swing, and turning. Heel and toe walking are normal. Tandem gait is normal.  Romberg is absent.   DIAGNOSTIC DATA (LABS, IMAGING, TESTING) - I reviewed patient records, labs, notes, testing and imaging myself where available.   ASSESSMENT AND PLAN  CHARMINE BOCKRATH is a 72 y.o. 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  Ph.D.  [facility] [address] [place], [place] [address] Ph: [phone] Fax: [phone]  CC: Kelton Pillar, MD

## 2018-08-08 ENCOUNTER — Ambulatory Visit (INDEPENDENT_AMBULATORY_CARE_PROVIDER_SITE_OTHER): Payer: Medicare Other | Admitting: Cardiovascular Disease

## 2018-08-08 ENCOUNTER — Encounter: Payer: Self-pay | Admitting: Cardiology

## 2018-08-08 ENCOUNTER — Encounter: Payer: Self-pay | Admitting: Cardiovascular Disease

## 2018-08-08 VITALS — BP 134/76 | HR 95 | Ht 66.0 in | Wt 250.8 lb

## 2018-08-08 DIAGNOSIS — I10 Essential (primary) hypertension: Secondary | ICD-10-CM

## 2018-08-08 DIAGNOSIS — E782 Mixed hyperlipidemia: Secondary | ICD-10-CM

## 2018-08-08 DIAGNOSIS — R0602 Shortness of breath: Secondary | ICD-10-CM

## 2018-08-08 NOTE — Patient Instructions (Addendum)
Dr Croitoru recommends that you schedule a follow-up appointment in 12 months. You will receive a reminder letter in the mail two months in advance. If you don't receive a letter, please call our office to schedule the follow-up appointment.  If you need a refill on your cardiac medications before your next appointment, please call your pharmacy. 

## 2018-08-08 NOTE — Progress Notes (Signed)
Cardiology Office Note    Date:  08/10/2018   ID:  [name], DOB 1946/12/07, MRN [id number]  PCP:  Kelton Pillar, MD  Cardiologist:   Sanda Klein, MD   Chief Complaint  Patient presents with  . Follow-up    pt reports ShOB on minimal exertion, neurologist Dr Krista Blue requested echo, not sch/not completed    History of Present Illness:  [name] is a 72 y.o. female with obesity and associated hypertension and hyperlipidemia returning for follow-up.   She remains frustrated about her inability to lose weight. Despite this recent physical exam and lab work showed very favorable findings.  Her hemoglobin A1c is only 5.3%.  Triglycerides are high at 256 but her LDL is borderline acceptable at 100.  She complains of dyspnea primarily with activity, but also sometimes at rest.  She does not have orthopnea or PND and there is no associated chest pain.  Her neurologist recommended that she should have an echocardiogram.  She has not had a formal echo since 2008, but EF was normal (61%) on the nuclear stress test in 2018  She has periods of dyspnea that have a clear association with gastroesophageal reflux disease.  She has not been recently troubled by worsening edema.  She denies angina, PND, orthopnea, syncope, palpitations or claudication.  She had a normal chest CT angiogram for presumed pulmonary embolism and a normal nuclear stress test in June 2018.  The chest CT, while not a coronary angiogram study, was remarkable for the absence of coronary or aortic calcification  She has a strong family history of coronary disease and a personal history of hypertension and hyperlipidemia and borderline diabetes mellitus. She has a previous stress test performed in 2008 that did not show signs of coronary disease and underwent cardiac catheterization in 2007 with angiographically patent coronary arteries without meaningful atherosclerosis. She has been intolerant to statin medications  and fibrates due to severe myalgia.   Past Medical History:  Diagnosis Date  . Diplopia   . GERD (gastroesophageal reflux disease)   . HTN (hypertension)   . Hyperlipidemia   . Hypertension   . Normal cardiac stress test 2011   echo stress with normal LV function., Normal 2-D echo 2008  . Obesity   . 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 ABDOMINAL HYSTERECTOMY  2001  . APPENDECTOMY  1957  . CARDIAC CATHETERIZATION  2007   Patent coronary arteries  . KNEE ARTHROSCOPY Right 1999  . LAPAROSCOPIC CHOLECYSTECTOMY  2007  . TONSILECTOMY/ADENOIDECTOMY WITH MYRINGOTOMY  1963  . TOTAL KNEE ARTHROPLASTY Right 07/21/2016   Procedure: RIGHT TOTAL KNEE ARTHROPLASTY;  Surgeon: Netta Cedars, MD;  Location: [facility];  Service: Orthopedics;  Laterality: Right;    Current Medications: Outpatient Medications Prior to Visit  Medication Sig Dispense Refill  . ALPRAZolam (XANAX) 1 MG tablet Take 1-2 tablets thirty minutes prior to MRI.  May take one additional tablet before entering scanner, if needed.  MUST HAVE DRIVER. 3 tablet 0  . amLODipine (NORVASC) 10 MG tablet Take 10 mg by mouth daily.    Marland Kitchen b complex vitamins tablet Take 1 tablet by mouth daily.    . Cholecalciferol (VITAMIN D-3) 1000 UNITS CAPS Take 2,000 Units by mouth daily.    . clopidogrel (PLAVIX) 75 MG tablet Take 1 tablet (75 mg total) by mouth daily. 30 tablet 11  . diphenhydrAMINE (ALLERGY) 25 mg capsule Take  25 mg by mouth every 6 (six) hours as needed for allergies.     Marland Kitchen FLAXSEED, LINSEED, PO Take 1,000 mg by mouth daily.    Marland Kitchen KRILL OIL PO Take 2 capsules by mouth daily.    . Lecith-Inosi-Chol-B12-Liver (LIVERITE PO) Take 2 tablets by mouth at bedtime.    . methocarbamol (ROBAXIN) 500 MG tablet Take 1 tablet (500 mg total) by mouth 3 (three) times daily as needed. 60 tablet 1  . OVER THE COUNTER MEDICATION Take 1 tablet by mouth daily. 1000mg  milk thissel    . quinapril  (ACCUPRIL) 40 MG tablet TAKE 1 TABLET BY MOUTH EVERYDAY AT BEDTIME 90 tablet 0  . ranitidine (ZANTAC) 75 MG tablet Take 75 mg by mouth 2 (two) times daily as needed for heartburn.    . sertraline (ZOLOFT) 50 MG tablet Take 25 mg by mouth daily.    . Turmeric Curcumin 500 MG CAPS Take 1 capsule by mouth daily.    . vitamin C (ASCORBIC ACID) 500 MG tablet Take 500 mg by mouth daily.    . sertraline (ZOLOFT) 25 MG tablet Take 25 mg by mouth daily.     No facility-administered medications prior to visit.      Allergies:   Statins; Ace inhibitors; Losartan; Tricor [fenofibrate]; and Penicillins   Social History   Socioeconomic History  . Marital status: Married    Spouse name: Not on file  . Number of children: 1  . Years of education: 54  . Highest education level: Bachelor's degree (e.g., BA, AB, BS)  Occupational History  . Occupation: Retired  Scientific laboratory technician  . Financial resource strain: Not on file  . Food insecurity:    Worry: Not on file    Inability: Not on file  . Transportation needs:    Medical: Not on file    Non-medical: Not on file  Tobacco Use  . Smoking status: Never Smoker  . Smokeless tobacco: Never Used  Substance and Sexual Activity  . Alcohol use: No  . Drug use: No  . Sexual activity: Not on file  Lifestyle  . Physical activity:    Days per week: Not on file    Minutes per session: Not on file  . Stress: Not on file  Relationships  . Social connections:    Talks on phone: Not on file    Gets together: Not on file    Attends religious service: Not on file    Active member of club or organization: Not on file    Attends meetings of clubs or organizations: Not on file    Relationship status: Not on file  Other Topics Concern  . Not on file  Social History Narrative   Lives at home with her husband.   Right-handed.   1-2 cups caffeine per day.     Family History:  The patient's family history includes Colon cancer (age of onset: 42) in her sister;  Colon cancer (age of onset: 68) in her father; Dementia in her mother; Heart disease (age of onset: 23) in her mother; Hyperlipidemia in her mother; Hypertension in her father and mother; Prostate cancer in her father; Sudden death (age of onset: 77) in her paternal grandfather; Sudden death (age of onset: 93) in her maternal grandfather.   ROS:   Please see the history of present illness.    ROS all other systems are reviewed and are negative  PHYSICAL EXAM:   VS:  BP 134/76   Pulse 95  Ht 5\' 6"  (1.676 m)   Wt 250 lb 12.8 oz (113.8 kg)   BMI 40.48 kg/m     General: Alert, oriented x3, no distress, morbidly obese Head: no evidence of trauma, PERRL, EOMI, no exophtalmos or lid lag, no myxedema, no xanthelasma; normal ears, nose and oropharynx Neck: normal jugular venous pulsations and no hepatojugular reflux; brisk carotid pulses without delay and no carotid bruits Chest: clear to auscultation, no signs of consolidation by percussion or palpation, normal fremitus, symmetrical and full respiratory excursions Cardiovascular: normal position and quality of the apical impulse, regular rhythm, normal first and second heart sounds, no murmurs, rubs or gallops Abdomen: no tenderness or distention, no masses by palpation, no abnormal pulsatility or arterial bruits, normal bowel sounds, no hepatosplenomegaly Extremities: no clubbing, cyanosis or edema; 2+ radial, ulnar and brachial pulses bilaterally; 2+ right femoral, posterior tibial and dorsalis pedis pulses; 2+ left femoral, posterior tibial and dorsalis pedis pulses; no subclavian or femoral bruits Neurological: grossly nonfocal Psych: Normal mood and affect   Wt Readings from Last 3 Encounters:  08/08/18 250 lb 12.8 oz (113.8 kg)  08/05/18 248 lb 12 oz (112.8 kg)  04/17/18 250 lb (113.4 kg)      Studies/Labs Reviewed:   EKG:  EKG is ordered today.  It shows normal sinus rhythm with left axis deviation and normal repolarization pattern  119 ms  Recent Labs: Dr. Laurann Montana, 2019 Total cholesterol 172, triglycerides 256, HDL 42, LDL 103, A1c 5.3%, creatinine 0.95, hemoglobin 16.3, TSH 0.95  ASSESSMENT:    1. Shortness of breath   2. Essential hypertension   3. Mixed hyperlipidemia   4. Morbid obesity ([place])      PLAN:  In order of problems listed above:  1. HTN: Controlled 2. HLP: While not ideal, her lipid profile is acceptable.  I would not change her current medications.  I think if we keep her LDL at or under 100 , he does not need to start indications. 3. Morbid obesity: I suspect this is the major reason for her dyspnea, but GERD may be contributing; nevertheless we will check an echocardiogram to make sure she does not have diastolic heart failure.  As always she is very frustrated with her inability to exercise it seems to be related mostly to orthopedic problems. Encouraged her to continue her efforts to exercise. Even if she doesn't lose weight it would still be to her benefit.    Medication Adjustments/Labs and Tests Ordered: Current medicines are reviewed at length with the patient today.  Concerns regarding medicines are outlined above.  Medication changes, Labs and Tests ordered today are listed in the Patient Instructions below. Patient Instructions  Dr Sallyanne Kuster recommends that you schedule a follow-up appointment in 12 months. You will receive a reminder letter in the mail two months in advance. If you don't receive a letter, please call our office to schedule the follow-up appointment.  If you need a refill on your cardiac medications before your next appointment, please call your pharmacy.    Signed, Sanda Klein, MD  08/10/2018 4:35 PM    [address], [place], [place]  [address] Phone: [phone]; Fax: [phone]

## 2018-08-14 DIAGNOSIS — H25012 Cortical age-related cataract, left eye: Secondary | ICD-10-CM | POA: Diagnosis not present

## 2018-08-14 DIAGNOSIS — H2511 Age-related nuclear cataract, right eye: Secondary | ICD-10-CM | POA: Diagnosis not present

## 2018-08-14 DIAGNOSIS — H2512 Age-related nuclear cataract, left eye: Secondary | ICD-10-CM | POA: Diagnosis not present

## 2018-08-21 DIAGNOSIS — H2511 Age-related nuclear cataract, right eye: Secondary | ICD-10-CM | POA: Diagnosis not present

## 2018-08-28 ENCOUNTER — Other Ambulatory Visit: Payer: Self-pay

## 2018-08-28 ENCOUNTER — Ambulatory Visit (HOSPITAL_COMMUNITY): Payer: Medicare Other | Attending: Cardiovascular Disease

## 2018-08-28 DIAGNOSIS — R0602 Shortness of breath: Secondary | ICD-10-CM

## 2018-08-28 DIAGNOSIS — E669 Obesity, unspecified: Secondary | ICD-10-CM | POA: Insufficient documentation

## 2018-08-28 DIAGNOSIS — H532 Diplopia: Secondary | ICD-10-CM | POA: Diagnosis not present

## 2018-08-28 DIAGNOSIS — I119 Hypertensive heart disease without heart failure: Secondary | ICD-10-CM | POA: Insufficient documentation

## 2018-08-28 DIAGNOSIS — E785 Hyperlipidemia, unspecified: Secondary | ICD-10-CM | POA: Insufficient documentation

## 2018-08-28 DIAGNOSIS — Z6841 Body Mass Index (BMI) 40.0 and over, adult: Secondary | ICD-10-CM | POA: Diagnosis not present

## 2018-09-08 ENCOUNTER — Other Ambulatory Visit: Payer: Self-pay | Admitting: Cardiovascular Disease

## 2018-10-18 DIAGNOSIS — Z23 Encounter for immunization: Secondary | ICD-10-CM | POA: Diagnosis not present

## 2018-11-18 ENCOUNTER — Other Ambulatory Visit: Payer: Self-pay | Admitting: Family Medicine

## 2018-11-18 DIAGNOSIS — Z1231 Encounter for screening mammogram for malignant neoplasm of breast: Secondary | ICD-10-CM

## 2018-12-11 HISTORY — PX: COLONOSCOPY: SHX174

## 2018-12-19 DIAGNOSIS — I129 Hypertensive chronic kidney disease with stage 1 through stage 4 chronic kidney disease, or unspecified chronic kidney disease: Secondary | ICD-10-CM | POA: Diagnosis not present

## 2018-12-19 DIAGNOSIS — Z1159 Encounter for screening for other viral diseases: Secondary | ICD-10-CM | POA: Diagnosis not present

## 2018-12-19 DIAGNOSIS — J309 Allergic rhinitis, unspecified: Secondary | ICD-10-CM | POA: Diagnosis not present

## 2018-12-19 DIAGNOSIS — G720 Drug-induced myopathy: Secondary | ICD-10-CM | POA: Diagnosis not present

## 2018-12-19 DIAGNOSIS — N183 Chronic kidney disease, stage 3 (moderate): Secondary | ICD-10-CM | POA: Diagnosis not present

## 2018-12-19 DIAGNOSIS — Z1389 Encounter for screening for other disorder: Secondary | ICD-10-CM | POA: Diagnosis not present

## 2018-12-19 DIAGNOSIS — G529 Cranial nerve disorder, unspecified: Secondary | ICD-10-CM | POA: Diagnosis not present

## 2018-12-19 DIAGNOSIS — R7309 Other abnormal glucose: Secondary | ICD-10-CM | POA: Diagnosis not present

## 2018-12-19 DIAGNOSIS — E7800 Pure hypercholesterolemia, unspecified: Secondary | ICD-10-CM | POA: Diagnosis not present

## 2018-12-19 DIAGNOSIS — Z0000 Encounter for general adult medical examination without abnormal findings: Secondary | ICD-10-CM | POA: Diagnosis not present

## 2018-12-20 DIAGNOSIS — Z961 Presence of intraocular lens: Secondary | ICD-10-CM | POA: Diagnosis not present

## 2019-01-03 ENCOUNTER — Ambulatory Visit
Admission: RE | Admit: 2019-01-03 | Discharge: 2019-01-03 | Disposition: A | Discharge status: Home / Self Care | Payer: Medicare Other | Source: Ambulatory Visit | Attending: Family Medicine | Admitting: Family Medicine

## 2019-01-03 DIAGNOSIS — Z1231 Encounter for screening mammogram for malignant neoplasm of breast: Secondary | ICD-10-CM

## 2019-04-09 ENCOUNTER — Other Ambulatory Visit: Payer: Self-pay | Admitting: Neurology

## 2019-04-18 ENCOUNTER — Encounter: Payer: Self-pay | Admitting: Gastroenterology

## 2019-04-29 DIAGNOSIS — Z85828 Personal history of other malignant neoplasm of skin: Secondary | ICD-10-CM | POA: Diagnosis not present

## 2019-04-29 DIAGNOSIS — L821 Other seborrheic keratosis: Secondary | ICD-10-CM | POA: Diagnosis not present

## 2019-04-29 DIAGNOSIS — D224 Melanocytic nevi of scalp and neck: Secondary | ICD-10-CM | POA: Diagnosis not present

## 2019-04-29 DIAGNOSIS — L720 Epidermal cyst: Secondary | ICD-10-CM | POA: Diagnosis not present

## 2019-04-29 DIAGNOSIS — D1801 Hemangioma of skin and subcutaneous tissue: Secondary | ICD-10-CM | POA: Diagnosis not present

## 2019-05-08 ENCOUNTER — Encounter: Payer: Self-pay | Admitting: General Surgery

## 2019-05-09 ENCOUNTER — Ambulatory Visit (INDEPENDENT_AMBULATORY_CARE_PROVIDER_SITE_OTHER): Payer: Medicare Other | Admitting: Gastroenterology

## 2019-05-09 ENCOUNTER — Encounter: Payer: Self-pay | Admitting: Gastroenterology

## 2019-05-09 ENCOUNTER — Other Ambulatory Visit: Payer: Self-pay

## 2019-05-09 ENCOUNTER — Telehealth: Payer: Self-pay

## 2019-05-09 VITALS — Ht 66.0 in | Wt 250.0 lb

## 2019-05-09 DIAGNOSIS — Z7901 Long term (current) use of anticoagulants: Secondary | ICD-10-CM | POA: Diagnosis not present

## 2019-05-09 DIAGNOSIS — K219 Gastro-esophageal reflux disease without esophagitis: Secondary | ICD-10-CM

## 2019-05-09 DIAGNOSIS — Z800 Family history of malignant neoplasm of digestive organs: Secondary | ICD-10-CM

## 2019-05-09 MED ORDER — PANTOPRAZOLE SODIUM 40 MG PO TBEC: 40.0000 mg | DELAYED_RELEASE_TABLET | Freq: Every day | ORAL | 3 refills | Status: AC

## 2019-05-09 MED ORDER — NA SULFATE-K SULFATE-MG SULF 17.5-3.13-1.6 GM/177ML PO SOLN: 1.0000 | Freq: Once | ORAL | 0 refills | Status: AC

## 2019-05-09 NOTE — Patient Instructions (Addendum)
Protonix 40mg  daily, 30 minutes before meals.  I have sent this to your pharmacy electronically  Antireflux measures  Schedule colonoscopy, next available appointment  Will need to get clearance from PMD if okay to hold Plavix for 5 days prior to the procedure.  Okay to continue low-dose aspirin 81 mg daily if needed.  Hold krill oil for 1 week prior to the procedure

## 2019-05-09 NOTE — Telephone Encounter (Signed)
  05/09/2019   RE: RYLYNN KOBS DOB: 05-20-1946 MRN: [id number]   Dear Dr. Laurann Montana,    We have scheduled the above patient for an endoscopic procedure. Our records show that she is on anticoagulation therapy.   Please advise as to how long the patient may come off her therapy of Plavix prior to the procedure, which is scheduled for 05/28/2019.  Please fax back/ or route the completed form to [facility] at [phone].   Sincerely,    Phillis Haggis

## 2019-05-09 NOTE — Progress Notes (Signed)
PHILIPPA VESSEY    [id number]    June 14, 1946  Primary Care Physician:Griffin, Margaretha Sheffield, MD  Referring Physician: Kelton Pillar, MD [address] [place], [place] [address]  This service was provided via audio only telemedicine (telephone) due to [place] 19 pandemic.  Patient location: Home Provider location: Office Used 2 patient identifiers to confirm the correct person. Explained the limitations in evaluation and management via telemedicine. Patient is aware of potential medical charges for this visit.  Patient consented to this virtual visit.  The persons participating in this telemedicine service were myself and the patient   Chief complaint: Heartburn  HPI: 73 year old female previously followed by Dr. Olevia Perches.  Last seen by Nicoletta Ba and February 2018. She is on chronic Plavix after an episode of TIA.  She was on Nexium previously for chronic GERD but after she was started on Plavix, was recommended to stop Nexium.  She continues to have intermittent heartburn and dyspepsia.  She is taking over-the-counter PPI as needed with breakthrough heartburn.  Denies any dysphagia or odynophagia.  No melena, abdominal pain, decreased appetite weight loss or blood per rectum.  Last colonoscopy April 2015 with mild diverticulosis diffusely no polyps. She does have family history of colon cancer Last EGD 2008 with finding of a 2 cm hiatal hernia and reflux esophagitis.   Outpatient Encounter Medications as of 05/09/2019  Medication Sig  . ALPRAZolam (XANAX) 1 MG tablet Take 1-2 tablets thirty minutes prior to MRI.  May take one additional tablet before entering scanner, if needed.  MUST HAVE DRIVER. (Patient not taking: Reported on 05/08/2019)  . amLODipine (NORVASC) 10 MG tablet Take 10 mg by mouth daily.  Marland Kitchen b complex vitamins tablet Take 1 tablet by mouth daily.  . Cholecalciferol (VITAMIN D-3) 1000 UNITS CAPS Take 2,000 Units by mouth daily.  . clopidogrel  (PLAVIX) 75 MG tablet Take 1 tablet (75 mg total) by mouth daily. PCP may take over future refills.  . diphenhydrAMINE (ALLERGY) 25 mg capsule Take 25 mg by mouth every 6 (six) hours as needed for allergies.   Marland Kitchen FLAXSEED, LINSEED, PO Take 1,000 mg by mouth daily.  Marland Kitchen KRILL OIL PO Take 2 capsules by mouth daily.  . Lecith-Inosi-Chol-B12-Liver (LIVERITE PO) Take 2 tablets by mouth at bedtime.  . methocarbamol (ROBAXIN) 500 MG tablet Take 1 tablet (500 mg total) by mouth 3 (three) times daily as needed. (Patient not taking: Reported on 05/08/2019)  . OVER THE COUNTER MEDICATION Take 1 tablet by mouth daily. 1000mg  milk thissel  . quinapril (ACCUPRIL) 40 MG tablet TAKE 1 TABLET BY MOUTH EVERYDAY AT BEDTIME  . sertraline (ZOLOFT) 50 MG tablet Take 25 mg by mouth daily.  . Turmeric Curcumin 500 MG CAPS Take 1 capsule by mouth daily.  . vitamin C (ASCORBIC ACID) 500 MG tablet Take 500 mg by mouth daily.   No facility-administered encounter medications on file as of 05/09/2019.     Allergies as of 05/09/2019 - Review Complete 05/08/2019  Allergen Reaction Noted  . Statins Swelling and Other (See Comments) 03/04/2014  . Ace inhibitors  07/06/2017  . Losartan  07/21/2016  . Tricor [fenofibrate]  07/06/2017  . Penicillins Rash     Past Medical History:  Diagnosis Date  . Diplopia   . GERD (gastroesophageal reflux disease)   . HTN (hypertension)   . Hyperlipidemia   . Hypertension   . Normal cardiac stress test 2011   echo  stress with normal LV function., Normal 2-D echo Mar 14, 2007  . Obesity   . 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 ABDOMINAL HYSTERECTOMY  Mar 13, 2000  . APPENDECTOMY  1957  . CARDIAC CATHETERIZATION  2006-03-13   Patent coronary arteries  . KNEE ARTHROSCOPY Right 1999  . LAPAROSCOPIC CHOLECYSTECTOMY  March 13, 2006  . TONSILECTOMY/ADENOIDECTOMY WITH MYRINGOTOMY  03/13/1962  . TOTAL KNEE ARTHROPLASTY Right 07/21/2016   Procedure: RIGHT TOTAL KNEE  ARTHROPLASTY;  Surgeon: Netta Cedars, MD;  Location: [facility];  Service: Orthopedics;  Laterality: Right;    Family History  Problem Relation Age of Onset  . Heart disease Mother 39  . Hypertension Mother   . Hyperlipidemia Mother   . Dementia Mother   . Colon cancer Father 67  . Hypertension Father   . Prostate cancer Father   . Sudden death Maternal Grandfather 03-13-1974       cerebral hemorrhage  . Sudden death Paternal Grandfather 74  . Colon cancer Sister 60  . Esophageal cancer Neg Hx   . Rectal cancer Neg Hx   . Stomach cancer Neg Hx     Social History   Socioeconomic History  . Marital status: Married    Spouse name: Not on file  . Number of children: 1  . Years of education: 21  . Highest education level: Bachelor's degree (e.g., BA, AB, BS)  Occupational History  . Occupation: Retired  Scientific laboratory technician  . Financial resource strain: Not on file  . Food insecurity:    Worry: Not on file    Inability: Not on file  . Transportation needs:    Medical: Not on file    Non-medical: Not on file  Tobacco Use  . Smoking status: Never Smoker  . Smokeless tobacco: Never Used  Substance and Sexual Activity  . Alcohol use: No  . Drug use: No  . Sexual activity: Not on file  Lifestyle  . Physical activity:    Days per week: Not on file    Minutes per session: Not on file  . Stress: Not on file  Relationships  . Social connections:    Talks on phone: Not on file    Gets together: Not on file    Attends religious service: Not on file    Active member of club or organization: Not on file    Attends meetings of clubs or organizations: Not on file    Relationship status: Not on file  . Intimate partner violence:    Fear of current or ex partner: Not on file    Emotionally abused: Not on file    Physically abused: Not on file    Forced sexual activity: Not on file  Other Topics Concern  . Not on file  Social History Narrative   Lives at home with her husband.   Right-handed.    1-2 cups caffeine per day.      Review of systems: Review of Systems as per HPI All other systems reviewed and are negative.   Physical Exam: Vitals were not taken and physical exam was not performed during this virtual visit.  Data Reviewed:  Reviewed labs, radiology imaging, old records and pertinent past GI work up   Assessment and Plan/Recommendations:  73 year old female with history of obesity, hypertension, IBS, chronic GERD status post TIA on Plavix  Family history of colon cancer, due for surveillance colonoscopy Schedule next available appointment Will need to get  clearance from PMD if okay to hold Plavix for 5 days prior to the procedure.  Okay to continue low-dose aspirin 81 mg daily if needed.  Hold krill oil for 1 week prior to the procedure  The risks and benefits as well as alternatives of endoscopic procedure(s) have been discussed and reviewed. All questions answered. The patient agrees to proceed.  GERD: Protonix 40mg  daily, 30 minutes before meals  Antireflux measures     K. Denzil Magnuson , MD   CC: Kelton Pillar, MD

## 2019-05-12 NOTE — Telephone Encounter (Signed)
Pt requested colonoscopy moved to June 29 at 11:00 AM.

## 2019-05-12 NOTE — Telephone Encounter (Signed)
Mailed new instructions reflecting updated colonoscopy date and time

## 2019-05-13 ENCOUNTER — Telehealth: Payer: Self-pay

## 2019-05-13 ENCOUNTER — Encounter: Payer: Self-pay | Admitting: Cardiovascular Disease

## 2019-05-13 NOTE — Telephone Encounter (Signed)
From a Cardiology poiunt of view, she can stop the plavix. Please include the performing physician name and I can send a letter via Epic. Sanda Klein, MD, [facility] [phone] office [phone] pager

## 2019-05-13 NOTE — Telephone Encounter (Signed)
05/09/2019   RE:      YARNELL KOZLOSKI DOB:   [birth year] MRN:   [id number]   Dear Dr. Laurann Montana,    We have scheduled the above patient for an endoscopic procedure. Our records show that she is on anticoagulation therapy.   Please advise as to how long the patient may come off her therapy of Plavix prior to the procedure, which is scheduled for 05/28/2019.  Please fax back/ or route the completed form to [facility] at [phone].   Sincerely,    Phillis Haggis

## 2019-05-13 NOTE — Telephone Encounter (Signed)
Oh, I'm sorry.  Its [name]

## 2019-05-13 NOTE — Telephone Encounter (Signed)
No worries, letter in [employer]

## 2019-05-14 ENCOUNTER — Telehealth: Payer: Self-pay

## 2019-05-14 NOTE — Telephone Encounter (Signed)
Dr. Henrene Pastor is on this in error.  This is Dr. Woodward Ku patient.

## 2019-05-14 NOTE — Telephone Encounter (Signed)
-----   Message from Irene Shipper, MD sent at 05/13/2019 10:03 PM EDT ----- Regarding: forward message I believe this message was for Dr. Silverio Decamp. Thanks  ----- Message ----- From: Sanda Klein, MD Sent: 05/13/2019   5:16 PM EDT To: Irene Shipper, MD

## 2019-05-14 NOTE — Telephone Encounter (Signed)
Told patient that per Dr. Sallyanne Kuster she could hold her plavix for 5-7 days prior to her procedure.  Patient agreed.

## 2019-05-28 ENCOUNTER — Encounter: Payer: Medicare Other | Admitting: Gastroenterology

## 2019-06-06 ENCOUNTER — Telehealth: Payer: Self-pay | Admitting: Gastroenterology

## 2019-06-06 NOTE — Telephone Encounter (Signed)

## 2019-06-09 ENCOUNTER — Encounter: Payer: Self-pay | Admitting: Gastroenterology

## 2019-06-09 ENCOUNTER — Other Ambulatory Visit: Payer: Self-pay

## 2019-06-09 ENCOUNTER — Ambulatory Visit (AMBULATORY_SURGERY_CENTER): Payer: Medicare Other | Admitting: Gastroenterology

## 2019-06-09 VITALS — BP 132/72 | HR 73 | Temp 98.6°F | Resp 14 | Ht 66.0 in | Wt 250.0 lb

## 2019-06-09 DIAGNOSIS — D122 Benign neoplasm of ascending colon: Secondary | ICD-10-CM

## 2019-06-09 DIAGNOSIS — D120 Benign neoplasm of cecum: Secondary | ICD-10-CM

## 2019-06-09 DIAGNOSIS — D123 Benign neoplasm of transverse colon: Secondary | ICD-10-CM

## 2019-06-09 DIAGNOSIS — Z1211 Encounter for screening for malignant neoplasm of colon: Secondary | ICD-10-CM | POA: Diagnosis not present

## 2019-06-09 DIAGNOSIS — Z800 Family history of malignant neoplasm of digestive organs: Secondary | ICD-10-CM

## 2019-06-09 MED ORDER — SODIUM CHLORIDE 0.9 % IV SOLN: 500.0000 mL | Freq: Once | INTRAVENOUS | Status: DC

## 2019-06-09 NOTE — Patient Instructions (Signed)
HANDOUTS  Given for polyps, diverticulosis, hemorrhoids and high fiber diet.  YOU HAD AN ENDOSCOPIC PROCEDURE TODAY AT [facility]:   Refer to the procedure report that was given to you for any specific questions about what was found during the examination.  If the procedure report does not answer your questions, please call your gastroenterologist to clarify.  If you requested that your care partner not be given the details of your procedure findings, then the procedure report has been included in a sealed envelope for you to review at your convenience later.  YOU SHOULD EXPECT: Some feelings of bloating in the abdomen. Passage of more gas than usual.  Walking can help get rid of the air that was put into your GI tract during the procedure and reduce the bloating. If you had a lower endoscopy (such as a colonoscopy or flexible sigmoidoscopy) you may notice spotting of blood in your stool or on the toilet paper. If you underwent a bowel prep for your procedure, you may not have a normal bowel movement for a few days.  Please Note:  You might notice some irritation and congestion in your nose or some drainage.  This is from the oxygen used during your procedure.  There is no need for concern and it should clear up in a day or so.  SYMPTOMS TO REPORT IMMEDIATELY:   Following lower endoscopy (colonoscopy or flexible sigmoidoscopy):  Excessive amounts of blood in the stool  Significant tenderness or worsening of abdominal pains  Swelling of the abdomen that is new, acute  Fever of 100F or higher  For urgent or emergent issues, a gastroenterologist can be reached at any hour by calling [phone].   DIET:  We do recommend a small meal at first, but then you may proceed to your regular diet.  Drink plenty of fluids but you should avoid alcoholic beverages for 24 hours.  ACTIVITY:  You should plan to take it easy for the rest of today and you should NOT DRIVE or use heavy  machinery until tomorrow (because of the sedation medicines used during the test).    FOLLOW UP: Our staff will call the number listed on your records 48-72 hours following your procedure to check on you and address any questions or concerns that you may have regarding the information given to you following your procedure. If we do not reach you, we will leave a message.  We will attempt to reach you two times.  During this call, we will ask if you have developed any symptoms of COVID 19. If you develop any symptoms (ie: fever, flu-like symptoms, shortness of breath, cough etc.) before then, please call [phone].  If you test positive for Covid 19 in the 2 weeks post procedure, please call and report this information to [place].    If any biopsies were taken you will be contacted by phone or by letter within the next 1-3 weeks.  Please call us at [phone] if you have not heard about the biopsies in 3 weeks.    SIGNATURES/CONFIDENTIALITY: You and/or your care partner have signed paperwork which will be entered into your electronic medical record.  These signatures attest to the fact that that the information above on your After Visit Summary has been reviewed and is understood.  Full responsibility of the confidentiality of this discharge information lies with you and/or your care-partner.

## 2019-06-09 NOTE — Progress Notes (Signed)
A/ox3, pleased with MAC, report to RN 

## 2019-06-09 NOTE — Progress Notes (Signed)
Call to Dr Silverio Decamp as pt's plavix was not addressed, also let her know pt states she has a dentist appointment tomorrow.  Dr Silverio Decamp states for pt to resume plavix 06/10/2019 and she will amend her note.  I wrote on pt's AVS and instructed pt to resume Plavix tomorrow per Dr Silverio Decamp instruction.

## 2019-06-09 NOTE — Op Note (Addendum)
[place] Patient Name: [name] Procedure Date: 06/09/2019 11:03 AM MRN: [id number] Endoscopist: Mauri Pole , MD Age: 73 Referring MD:  Date of Birth: 1946/07/31 Gender: Female Account #: [id number] Procedure:                Colonoscopy Indications:              Screening in patient at increased risk: Family                            history of 1st-degree relative with colorectal                            cancer Medicines:                Monitored Anesthesia Care Procedure:                Pre-Anesthesia Assessment:                           - Prior to the procedure, a History and Physical                            was performed, and patient medications and                            allergies were reviewed. The patient's tolerance of                            previous anesthesia was also reviewed. The risks                            and benefits of the procedure and the sedation                            options and risks were discussed with the patient.                            All questions were answered, and informed consent                            was obtained. Prior Anticoagulants: The patient                            last took Plavix (clopidogrel) 5 days prior to the                            procedure. ASA Grade Assessment: III - A patient                            with severe systemic disease. After reviewing the                            risks and benefits, the patient was deemed in  satisfactory condition to undergo the procedure.                           After obtaining informed consent, the colonoscope                            was passed under direct vision. Throughout the                            procedure, the patient's blood pressure, pulse, and                            oxygen saturations were monitored continuously. The                            Colonoscope was introduced through the anus  and                            advanced to the the cecum, identified by                            appendiceal orifice and ileocecal valve. The                            colonoscopy was somewhat difficult due to multiple                            diverticula in the colon and restricted mobility of                            the colon. Successful completion of the procedure                            was aided by applying abdominal pressure. The                            patient tolerated the procedure well. The quality                            of the bowel preparation was good. The ileocecal                            valve, appendiceal orifice, and rectum were                            photographed. Scope In: 11:08:24 AM Scope Out: 11:32:57 AM Scope Withdrawal Time: 0 hours 12 minutes 59 seconds  Total Procedure Duration: 0 hours 24 minutes 33 seconds  Findings:                 The perianal and digital rectal examinations were                            normal.  Four sessile polyps were found in the transverse                            colon, ascending colon and cecum. The polyps were 5                            to 9 mm in size. These polyps were removed with a                            cold snare. Resection and retrieval were complete.                           Multiple small and large-mouthed diverticula were                            found in the sigmoid colon and descending colon.                            There was narrowing of the colon in association                            with the diverticular opening. Peri-diverticular                            erythema was seen. There was evidence of an                            impacted diverticulum.                           Non-bleeding internal hemorrhoids were found during                            retroflexion. The hemorrhoids were medium-sized. Complications:            No immediate  complications. Estimated Blood Loss:     Estimated blood loss was minimal. Impression:               - Four 5 to 9 mm polyps in the transverse colon, in                            the ascending colon and in the cecum, removed with                            a cold snare. Resected and retrieved.                           - Severe diverticulosis in the sigmoid colon and in                            the descending colon. There was narrowing of the                            colon  in association with the diverticular opening.                            Peri-diverticular erythema was seen. There was                            evidence of an impacted diverticulum.                           - Non-bleeding internal hemorrhoids. Recommendation:           - Patient has a contact number available for                            emergencies. The signs and symptoms of potential                            delayed complications were discussed with the                            patient. Return to normal activities tomorrow.                            Written discharge instructions were provided to the                            patient.                           - Resume previous diet.                           - Continue present medications.                           - Await pathology results.                           - Repeat colonoscopy in 3 years for surveillance                            based on pathology results.                           - Resume Plavix (clopidogrel) at prior dose                            tomorrow. Refer to managing physician for further                            adjustment of therapy. Mauri Pole, MD 06/09/2019 11:37:33 AM This report has been signed electronically.

## 2019-06-09 NOTE — Progress Notes (Signed)
Called to room to assist during endoscopic procedure.  Patient ID and intended procedure confirmed with present staff. Received instructions for my participation in the procedure from the performing physician.  

## 2019-06-11 ENCOUNTER — Telehealth: Payer: Self-pay | Admitting: *Deleted

## 2019-06-11 ENCOUNTER — Telehealth: Payer: Self-pay

## 2019-06-11 NOTE — Telephone Encounter (Signed)
No answer for first post procedure call back. Left message and will call back this afternoon. SM

## 2019-06-11 NOTE — Telephone Encounter (Signed)
  Follow up Call-  Call back number 06/09/2019  Post procedure Call Back phone  # [phone]  Permission to leave phone message Yes  Some recent data might be hidden     Patient questions:  Do you have a fever, pain , or abdominal swelling? No. Pain Score  0 *  Have you tolerated food without any problems? Yes.    Have you been able to return to your normal activities? Yes.    Do you have any questions about your discharge instructions: Diet   No. Medications  No. Follow up visit  No.  Do you have questions or concerns about your Care? No.  Actions: * If pain score is 4 or above: No action needed, pain <4.  1. Have you developed a fever since your procedure? no  2.   Have you had an respiratory symptoms (SOB or cough) since your procedure? no  3.   Have you tested positive for COVID 19 since your procedure no  4.   Have you had any family members/close contacts diagnosed with the COVID 19 since your procedure?  no   If yes to any of these questions please route to Joylene John, RN and Alphonsa Gin, Therapist, sports.

## 2019-06-20 ENCOUNTER — Encounter: Payer: Self-pay | Admitting: Gastroenterology

## 2019-06-24 DIAGNOSIS — F411 Generalized anxiety disorder: Secondary | ICD-10-CM | POA: Diagnosis not present

## 2019-06-24 DIAGNOSIS — R7303 Prediabetes: Secondary | ICD-10-CM | POA: Diagnosis not present

## 2019-06-24 DIAGNOSIS — G720 Drug-induced myopathy: Secondary | ICD-10-CM | POA: Diagnosis not present

## 2019-06-24 DIAGNOSIS — E669 Obesity, unspecified: Secondary | ICD-10-CM | POA: Diagnosis not present

## 2019-06-24 DIAGNOSIS — N183 Chronic kidney disease, stage 3 (moderate): Secondary | ICD-10-CM | POA: Diagnosis not present

## 2019-06-24 DIAGNOSIS — H4920 Sixth [abducent] nerve palsy, unspecified eye: Secondary | ICD-10-CM | POA: Diagnosis not present

## 2019-06-24 DIAGNOSIS — I129 Hypertensive chronic kidney disease with stage 1 through stage 4 chronic kidney disease, or unspecified chronic kidney disease: Secondary | ICD-10-CM | POA: Diagnosis not present

## 2019-06-24 DIAGNOSIS — E7800 Pure hypercholesterolemia, unspecified: Secondary | ICD-10-CM | POA: Diagnosis not present

## 2019-06-25 DIAGNOSIS — E7800 Pure hypercholesterolemia, unspecified: Secondary | ICD-10-CM | POA: Diagnosis not present

## 2019-06-25 DIAGNOSIS — R7303 Prediabetes: Secondary | ICD-10-CM | POA: Diagnosis not present

## 2019-06-25 DIAGNOSIS — I129 Hypertensive chronic kidney disease with stage 1 through stage 4 chronic kidney disease, or unspecified chronic kidney disease: Secondary | ICD-10-CM | POA: Diagnosis not present

## 2019-08-13 ENCOUNTER — Encounter: Payer: Self-pay | Admitting: Cardiovascular Disease

## 2019-08-13 ENCOUNTER — Other Ambulatory Visit: Payer: Self-pay

## 2019-08-13 ENCOUNTER — Ambulatory Visit (INDEPENDENT_AMBULATORY_CARE_PROVIDER_SITE_OTHER): Payer: Medicare Other | Admitting: Cardiovascular Disease

## 2019-08-13 VITALS — BP 142/94 | HR 102 | Temp 98.1°F | Wt 260.0 lb

## 2019-08-13 DIAGNOSIS — E782 Mixed hyperlipidemia: Secondary | ICD-10-CM

## 2019-08-13 DIAGNOSIS — I10 Essential (primary) hypertension: Secondary | ICD-10-CM

## 2019-08-13 MED ORDER — CARVEDILOL 6.25 MG PO TABS: 6.2500 mg | ORAL_TABLET | Freq: Two times a day (BID) | ORAL | 3 refills | Status: DC

## 2019-08-13 NOTE — Patient Instructions (Addendum)
Medication Instructions:  Stop Amlodipine and Quinapril.  Start Carvedilol 6.25mg  twice daily.   Lab work: NONE If you have labs (blood work) drawn today and your tests are completely normal, you will receive your results only by: [place] (if you have MyChart) OR A paper copy in the mail If you have any lab test that is abnormal or we need to change your treatment, we will call you to review the results.  Testing/Procedures: NONE  Follow-Up: At [facility], you and your health needs are our priority.  As part of our continuing mission to provide you with exceptional heart care, we have created designated Provider Care Teams.  These Care Teams include your primary Cardiologist (physician) and Advanced Practice Providers (APPs -  Physician Assistants and Nurse Practitioners) who all work together to provide you with the care you need, when you need it. You will need a follow up appointment in 12 months.  Please call our office 2 months in advance to schedule this appointment.  You may see Sanda Klein, MD or one of the following Advanced Practice Providers on your designated Care Team: Almyra Deforest, [place] Fabian Sharp, [place]  Any Other Special Instructions Will Be Listed Below (If Applicable).  Check your blood pressure daily for 3 weeks. Call us or send through [place] with the results.

## 2019-08-13 NOTE — Progress Notes (Signed)
Cardiology Office Note    Date:  08/13/2019   ID:  [name], DOB 1946/01/12, MRN [id number]  PCP:  Kelton Pillar, MD  Cardiologist:   Sanda Klein, MD   Chief Complaint  Patient presents with  . Hypertension    History of Present Illness:  [name] is a 73 y.o. female with obesity and associated hypertension and hyperlipidemia returning for follow-up.   She developed edema with the amlodipine and has stopped taking this medicine for almost 2 weeks now.  The edema subsequently improved.  She has an occasional dry nagging cough with the quinapril.  In the past she had side effects with losartan (she does not remember details) and with valsartan (diarrhea).  She is frustrated by additional weight gain, worsened during the isolation related to COVID-19.  She has mild exertional dyspnea when she tries to do more than usual activity.  The patient specifically denies any chest pain at rest or with exertion, dyspnea at rest or with light exertion, orthopnea, paroxysmal nocturnal dyspnea, syncope, palpitations, focal neurological deficits, intermittent claudication, unexplained weight gain, cough, hemoptysis or wheezing.  She remains frustrated about her inability to lose weight.    Her most recent LDL cholesterol was 123 (June 25, 2019).  She has been unable to take statins even on a twice weekly basis due to the leg muscle pain.  In the past, she has periods of dyspnea that had clear association with gastroesophageal reflux.   She had a normal chest CT angiogram for presumed pulmonary embolism and a normal nuclear stress test in June 2018.  The chest CT, while not a coronary angiogram study, was remarkable for the absence of coronary or aortic calcification.  She had a normal nuclear stress test in 2018.  She had an echocardiogram in September 2019 that showed normal left ventricular systolic function with mild diastolic dysfunction.  She has a strong family history of  coronary disease and a personal history of hypertension and hyperlipidemia and borderline diabetes mellitus.  She underwent cardiac catheterization in 2007 with angiographically patent coronary arteries without meaningful atherosclerosis. She has been intolerant to statin medications and fibrates due to severe myalgia.   Past Medical History:  Diagnosis Date  . Diplopia   . GERD (gastroesophageal reflux disease)   . HTN (hypertension)   . Hyperlipidemia   . Hypertension   . Normal cardiac stress test 2011   echo stress with normal LV function., Normal 2-D echo 2008  . Obesity   . 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 ABDOMINAL HYSTERECTOMY  2001  . APPENDECTOMY  1957  . CARDIAC CATHETERIZATION  2007   Patent coronary arteries  . KNEE ARTHROSCOPY Right 1999  . LAPAROSCOPIC CHOLECYSTECTOMY  2007  . TONSILECTOMY/ADENOIDECTOMY WITH MYRINGOTOMY  1963  . 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 TOTAL KNEE ARTHROPLASTY     right    Current Medications: Outpatient Medications Prior to Visit  Medication Sig Dispense Refill  . ALPRAZolam (XANAX) 1 MG tablet Take 1-2 tablets thirty minutes prior to MRI.  May take one additional tablet before entering scanner, if needed.  MUST HAVE DRIVER. 3 tablet 0  . amoxicillin (AMOXIL) 500 MG tablet     . Cholecalciferol (VITAMIN D-3) 1000 UNITS CAPS Take 2,000 Units by mouth  daily.    . clopidogrel (PLAVIX) 75 MG tablet Take 1 tablet (75 mg total) by mouth daily. PCP may take over future refills. 90 tablet 1  . diphenhydrAMINE (ALLERGY) 25 mg capsule Take 25 mg by mouth every 6 (six) hours as needed for allergies.     Marland Kitchen FLAXSEED, LINSEED, PO Take 1,000 mg by mouth daily.    Marland Kitchen KRILL OIL PO Take 1 capsule by mouth daily.     . Lecith-Inosi-Chol-B12-Liver (LIVERITE  PO) Take 2 tablets by mouth at bedtime.    . methocarbamol (ROBAXIN) 500 MG tablet Take 1 tablet (500 mg total) by mouth 3 (three) times daily as needed. 60 tablet 1  . OVER THE COUNTER MEDICATION Take 1 tablet by mouth daily. 1000mg  milk thissel    . pantoprazole (PROTONIX) 40 MG tablet Take 1 tablet (40 mg total) by mouth daily. 90 tablet 3  . sertraline (ZOLOFT) 50 MG tablet Take 25 mg by mouth daily.    . Turmeric Curcumin 500 MG CAPS Take 1 capsule by mouth daily.    . vitamin C (ASCORBIC ACID) 500 MG tablet Take 500 mg by mouth daily.    . quinapril (ACCUPRIL) 40 MG tablet TAKE 1 TABLET BY MOUTH EVERYDAY AT BEDTIME 90 tablet 3  . b complex vitamins tablet Take 1 tablet by mouth daily.    Marland Kitchen amLODipine (NORVASC) 10 MG tablet Take 10 mg by mouth daily.     No facility-administered medications prior to visit.      Allergies:   Statins, Ace inhibitors, Losartan, Tricor [fenofibrate], and Penicillins   Social History   Socioeconomic History  . Marital status: Married    Spouse name: Not on file  . Number of children: 1  . Years of education: 100  . Highest education level: Bachelor's degree (e.g., BA, AB, BS)  Occupational History  . Occupation: Retired  Scientific laboratory technician  . Financial resource strain: Not on file  . Food insecurity    Worry: Not on file    Inability: Not on file  . Transportation needs    Medical: Not on file    Non-medical: Not on file  Tobacco Use  . Smoking status: Never Smoker  . Smokeless tobacco: Never Used  Substance and Sexual Activity  . Alcohol use: No  . Drug use: No  . Sexual activity: Not on file  Lifestyle  . Physical activity    Days per week: Not on file    Minutes per session: Not on file  . Stress: Not on file  Relationships  . Social Herbalist on phone: Not on file    Gets together: Not on file    Attends religious service: Not on file    Active member of club or organization: Not on file    Attends meetings of clubs or  organizations: Not on file    Relationship status: Not on file  Other Topics Concern  . Not on file  Social History Narrative   Lives at home with her husband.   Right-handed.   1-2 cups caffeine per day.     Family History:  The patient's family history includes Colon cancer (age of onset: 66) in her sister; Colon cancer (age of onset: 70) in her father; Dementia in her mother; Heart disease (age of onset: 23) in her mother; Hyperlipidemia in her mother; Hypertension in her father and mother; Prostate cancer in her father; Sudden death (age of onset: 60) in her paternal grandfather;  Sudden death (age of onset: 83) in her maternal grandfather.   ROS:   Please see the history of present illness.    ROS all other systems are reviewed and are negative  PHYSICAL EXAM:   VS:  BP (!) 153/112 (BP Location: Left Wrist, Patient Position: Sitting, Cuff Size: Large)   Pulse (!) 102   Temp 98.1 F (36.7 C)   Wt 260 lb (117.9 kg)   SpO2 97%   BMI 41.97 kg/m      General: Alert, oriented x3, no distress, Morbidly obese Head: no evidence of trauma, PERRL, EOMI, no exophtalmos or lid lag, no myxedema, no xanthelasma; normal ears, nose and oropharynx Neck: normal jugular venous pulsations and no hepatojugular reflux; brisk carotid pulses without delay and no carotid bruits Chest: clear to auscultation, no signs of consolidation by percussion or palpation, normal fremitus, symmetrical and full respiratory excursions Cardiovascular: normal position and quality of the apical impulse, regular rhythm, normal first and second heart sounds, no murmurs, rubs or gallops Abdomen: no tenderness or distention, no masses by palpation, no abnormal pulsatility or arterial bruits, normal bowel sounds, no hepatosplenomegaly Extremities: no clubbing, cyanosis or edema; 2+ radial, ulnar and brachial pulses bilaterally; 2+ right femoral, posterior tibial and dorsalis pedis pulses; 2+ left femoral, posterior tibial and  dorsalis pedis pulses; no subclavian or femoral bruits Neurological: grossly nonfocal Psych: Normal mood and affect   Wt Readings from Last 3 Encounters:  08/13/19 260 lb (117.9 kg)  06/09/19 250 lb (113.4 kg)  05/09/19 250 lb (113.4 kg)      Studies/Labs Reviewed:   EKG:  EKG is ordered today.  It shows normal sinus rhythm, pre-existing left axis deviation, normal repolarization.,  QTC 432 ms Recent Labs: Dr. Laurann Montana, 06/25/2019 Total cholesterol 208, LDL 123, HDL 48, triglycerides of 91 Hemoglobin A1c 5.3%, creatinine 0.88, potassium 4.5, normal liver function tests  ASSESSMENT:    1. Essential hypertension   2. Morbid obesity ([place])   3. Mixed hyperlipidemia      PLAN:  In order of problems listed above:  1. HTN: Not well controlled today.  Note that on June 24, 2019 her blood pressure was listed as 109/69.  She was taking amlodipine at the time, but wants to stop it due to the edema.  She has been taking any amlodipine almost 2 weeks.  Also complains of a cough with no improvement had side effects of losartan and valsartan in the past.  Will try pulmonary strategies and have started 6.25 mg twice daily instead of the quinapril.  She will send [place] blood pressure readings through mychart in about 3 weeks. 2. HLP: Ideally would like her LDL less than 100, but she has not tolerated several different statins even on a less than daily regimen.  Since she does not have known coronary or vascular disease and had no coronary calcification on chest CT, I do not think PCSK9 inhibitors are warranted for her relatively mild hypercholesterolemia.  I would continue focusing on healthy lifestyle changes including weight loss. 3. Morbid obesity: Encouraged her to find ways to exercise even during the current coronavirus pandemic.    Medication Adjustments/Labs and Tests Ordered: Current medicines are reviewed at length with the patient today.  Concerns regarding medicines are outlined above.   Medication changes, Labs and Tests ordered today are listed in the Patient Instructions below. Patient Instructions  Medication Instructions:  Stop Amlodipine and Quinapril.  Start Carvedilol 6.25mg  twice daily.   Lab work: NONE If  you have labs (blood work) drawn today and your tests are completely normal, you will receive your results only by: [place] (if you have MyChart) OR A paper copy in the mail If you have any lab test that is abnormal or we need to change your treatment, we will call you to review the results.  Testing/Procedures: NONE  Follow-Up: At [facility], you and your health needs are our priority.  As part of our continuing mission to provide you with exceptional heart care, we have created designated Provider Care Teams.  These Care Teams include your primary Cardiologist (physician) and Advanced Practice Providers (APPs -  Physician Assistants and Nurse Practitioners) who all work together to provide you with the care you need, when you need it. You will need a follow up appointment in 12 months.  Please call our office 2 months in advance to schedule this appointment.  You may see Sanda Klein, MD or one of the following Advanced Practice Providers on your designated Care Team: Almyra Deforest, [place] Fabian Sharp, [place]  Any Other Special Instructions Will Be Listed Below (If Applicable).  Check your blood pressure daily for 3 weeks. Call us or send through [place] with the results.          Signed, Sanda Klein, MD  08/13/2019 10:56 AM    [facility] [address], [place], [place]  [address] Phone: [phone]; Fax: [phone]

## 2019-10-04 DIAGNOSIS — Z23 Encounter for immunization: Secondary | ICD-10-CM | POA: Diagnosis not present

## 2019-10-05 ENCOUNTER — Other Ambulatory Visit: Payer: Self-pay | Admitting: Neurology

## 2019-11-25 ENCOUNTER — Other Ambulatory Visit: Payer: Self-pay | Admitting: Family Medicine

## 2019-11-25 DIAGNOSIS — Z1231 Encounter for screening mammogram for malignant neoplasm of breast: Secondary | ICD-10-CM

## 2019-12-31 ENCOUNTER — Other Ambulatory Visit: Payer: Self-pay | Admitting: Family Medicine

## 2019-12-31 DIAGNOSIS — M8580 Other specified disorders of bone density and structure, unspecified site: Secondary | ICD-10-CM

## 2019-12-31 DIAGNOSIS — E559 Vitamin D deficiency, unspecified: Secondary | ICD-10-CM | POA: Diagnosis not present

## 2019-12-31 DIAGNOSIS — Z0000 Encounter for general adult medical examination without abnormal findings: Secondary | ICD-10-CM | POA: Diagnosis not present

## 2019-12-31 DIAGNOSIS — Z1389 Encounter for screening for other disorder: Secondary | ICD-10-CM | POA: Diagnosis not present

## 2019-12-31 DIAGNOSIS — G720 Drug-induced myopathy: Secondary | ICD-10-CM | POA: Diagnosis not present

## 2019-12-31 DIAGNOSIS — K219 Gastro-esophageal reflux disease without esophagitis: Secondary | ICD-10-CM | POA: Diagnosis not present

## 2019-12-31 DIAGNOSIS — I129 Hypertensive chronic kidney disease with stage 1 through stage 4 chronic kidney disease, or unspecified chronic kidney disease: Secondary | ICD-10-CM | POA: Diagnosis not present

## 2019-12-31 DIAGNOSIS — J45909 Unspecified asthma, uncomplicated: Secondary | ICD-10-CM | POA: Diagnosis not present

## 2019-12-31 DIAGNOSIS — F39 Unspecified mood [affective] disorder: Secondary | ICD-10-CM | POA: Diagnosis not present

## 2019-12-31 DIAGNOSIS — E7800 Pure hypercholesterolemia, unspecified: Secondary | ICD-10-CM | POA: Diagnosis not present

## 2019-12-31 DIAGNOSIS — R7303 Prediabetes: Secondary | ICD-10-CM | POA: Diagnosis not present

## 2019-12-31 DIAGNOSIS — N183 Chronic kidney disease, stage 3 unspecified: Secondary | ICD-10-CM | POA: Diagnosis not present

## 2020-01-07 DIAGNOSIS — H26492 Other secondary cataract, left eye: Secondary | ICD-10-CM | POA: Diagnosis not present

## 2020-01-13 ENCOUNTER — Ambulatory Visit: Payer: Medicare Other

## 2020-01-14 DIAGNOSIS — H26491 Other secondary cataract, right eye: Secondary | ICD-10-CM | POA: Diagnosis not present

## 2020-03-16 ENCOUNTER — Ambulatory Visit: Payer: Medicare Other

## 2020-03-16 ENCOUNTER — Other Ambulatory Visit: Payer: Medicare Other

## 2020-04-13 DIAGNOSIS — I129 Hypertensive chronic kidney disease with stage 1 through stage 4 chronic kidney disease, or unspecified chronic kidney disease: Secondary | ICD-10-CM | POA: Diagnosis not present

## 2020-04-13 DIAGNOSIS — R1011 Right upper quadrant pain: Secondary | ICD-10-CM | POA: Diagnosis not present

## 2020-04-13 DIAGNOSIS — R1013 Epigastric pain: Secondary | ICD-10-CM | POA: Diagnosis not present

## 2020-04-13 DIAGNOSIS — N183 Chronic kidney disease, stage 3 unspecified: Secondary | ICD-10-CM | POA: Diagnosis not present

## 2020-04-13 DIAGNOSIS — R142 Eructation: Secondary | ICD-10-CM | POA: Diagnosis not present

## 2020-04-13 DIAGNOSIS — K219 Gastro-esophageal reflux disease without esophagitis: Secondary | ICD-10-CM | POA: Diagnosis not present

## 2020-05-25 DIAGNOSIS — D225 Melanocytic nevi of trunk: Secondary | ICD-10-CM | POA: Diagnosis not present

## 2020-05-25 DIAGNOSIS — L720 Epidermal cyst: Secondary | ICD-10-CM | POA: Diagnosis not present

## 2020-05-25 DIAGNOSIS — D1801 Hemangioma of skin and subcutaneous tissue: Secondary | ICD-10-CM | POA: Diagnosis not present

## 2020-05-25 DIAGNOSIS — L304 Erythema intertrigo: Secondary | ICD-10-CM | POA: Diagnosis not present

## 2020-05-25 DIAGNOSIS — L821 Other seborrheic keratosis: Secondary | ICD-10-CM | POA: Diagnosis not present

## 2020-05-25 DIAGNOSIS — Z85828 Personal history of other malignant neoplasm of skin: Secondary | ICD-10-CM | POA: Diagnosis not present

## 2020-05-25 DIAGNOSIS — D2372 Other benign neoplasm of skin of left lower limb, including hip: Secondary | ICD-10-CM | POA: Diagnosis not present

## 2020-06-01 ENCOUNTER — Other Ambulatory Visit: Payer: Self-pay

## 2020-06-01 ENCOUNTER — Ambulatory Visit
Admission: RE | Admit: 2020-06-01 | Discharge: 2020-06-01 | Disposition: A | Discharge status: Home / Self Care | Payer: Medicare Other | Source: Ambulatory Visit | Attending: Family Medicine | Admitting: Family Medicine

## 2020-06-01 DIAGNOSIS — M8580 Other specified disorders of bone density and structure, unspecified site: Secondary | ICD-10-CM

## 2020-06-01 DIAGNOSIS — Z1231 Encounter for screening mammogram for malignant neoplasm of breast: Secondary | ICD-10-CM

## 2020-06-29 DIAGNOSIS — Z7189 Other specified counseling: Secondary | ICD-10-CM | POA: Diagnosis not present

## 2020-06-29 DIAGNOSIS — N183 Chronic kidney disease, stage 3 unspecified: Secondary | ICD-10-CM | POA: Diagnosis not present

## 2020-06-29 DIAGNOSIS — R7303 Prediabetes: Secondary | ICD-10-CM | POA: Diagnosis not present

## 2020-06-29 DIAGNOSIS — E7800 Pure hypercholesterolemia, unspecified: Secondary | ICD-10-CM | POA: Diagnosis not present

## 2020-06-29 DIAGNOSIS — E669 Obesity, unspecified: Secondary | ICD-10-CM | POA: Diagnosis not present

## 2020-06-29 DIAGNOSIS — I129 Hypertensive chronic kidney disease with stage 1 through stage 4 chronic kidney disease, or unspecified chronic kidney disease: Secondary | ICD-10-CM | POA: Diagnosis not present

## 2020-07-07 DIAGNOSIS — Z20822 Contact with and (suspected) exposure to covid-19: Secondary | ICD-10-CM | POA: Diagnosis not present

## 2020-07-13 DIAGNOSIS — Z20822 Contact with and (suspected) exposure to covid-19: Secondary | ICD-10-CM | POA: Diagnosis not present

## 2020-07-14 ENCOUNTER — Ambulatory Visit (INDEPENDENT_AMBULATORY_CARE_PROVIDER_SITE_OTHER): Payer: Medicare Other | Admitting: Family Medicine

## 2020-07-26 ENCOUNTER — Ambulatory Visit (INDEPENDENT_AMBULATORY_CARE_PROVIDER_SITE_OTHER): Payer: Medicare Other | Admitting: Family Medicine

## 2020-07-26 ENCOUNTER — Other Ambulatory Visit: Payer: Self-pay | Admitting: Cardiovascular Disease

## 2020-07-26 DIAGNOSIS — Z20822 Contact with and (suspected) exposure to covid-19: Secondary | ICD-10-CM | POA: Diagnosis not present

## 2020-07-26 DIAGNOSIS — N3090 Cystitis, unspecified without hematuria: Secondary | ICD-10-CM | POA: Diagnosis not present

## 2020-07-26 DIAGNOSIS — R319 Hematuria, unspecified: Secondary | ICD-10-CM | POA: Diagnosis not present

## 2020-07-28 ENCOUNTER — Ambulatory Visit (INDEPENDENT_AMBULATORY_CARE_PROVIDER_SITE_OTHER): Payer: Medicare Other | Admitting: Family Medicine

## 2020-08-04 DIAGNOSIS — N390 Urinary tract infection, site not specified: Secondary | ICD-10-CM | POA: Diagnosis not present

## 2020-08-09 ENCOUNTER — Ambulatory Visit (INDEPENDENT_AMBULATORY_CARE_PROVIDER_SITE_OTHER): Payer: Medicare Other | Admitting: Family Medicine

## 2020-08-13 ENCOUNTER — Ambulatory Visit: Payer: Medicare Other | Admitting: Cardiovascular Disease

## 2020-09-02 ENCOUNTER — Ambulatory Visit (INDEPENDENT_AMBULATORY_CARE_PROVIDER_SITE_OTHER): Payer: Medicare Other | Admitting: Family Medicine

## 2020-09-02 ENCOUNTER — Other Ambulatory Visit: Payer: Self-pay

## 2020-09-02 ENCOUNTER — Encounter (INDEPENDENT_AMBULATORY_CARE_PROVIDER_SITE_OTHER): Payer: Self-pay | Admitting: Family Medicine

## 2020-09-02 VITALS — BP 159/87 | HR 90 | Temp 98.8°F | Ht 65.0 in | Wt 256.0 lb

## 2020-09-02 DIAGNOSIS — R0602 Shortness of breath: Secondary | ICD-10-CM | POA: Diagnosis not present

## 2020-09-02 DIAGNOSIS — E785 Hyperlipidemia, unspecified: Secondary | ICD-10-CM | POA: Diagnosis not present

## 2020-09-02 DIAGNOSIS — R5383 Other fatigue: Secondary | ICD-10-CM | POA: Diagnosis not present

## 2020-09-02 DIAGNOSIS — Z1331 Encounter for screening for depression: Secondary | ICD-10-CM

## 2020-09-02 DIAGNOSIS — E538 Deficiency of other specified B group vitamins: Secondary | ICD-10-CM | POA: Diagnosis not present

## 2020-09-02 DIAGNOSIS — R5382 Chronic fatigue, unspecified: Secondary | ICD-10-CM | POA: Insufficient documentation

## 2020-09-02 DIAGNOSIS — R7303 Prediabetes: Secondary | ICD-10-CM | POA: Insufficient documentation

## 2020-09-02 DIAGNOSIS — G9332 Myalgic encephalomyelitis/chronic fatigue syndrome: Secondary | ICD-10-CM | POA: Insufficient documentation

## 2020-09-02 DIAGNOSIS — Z9189 Other specified personal risk factors, not elsewhere classified: Secondary | ICD-10-CM

## 2020-09-02 DIAGNOSIS — E7849 Other hyperlipidemia: Secondary | ICD-10-CM

## 2020-09-02 DIAGNOSIS — Z6841 Body Mass Index (BMI) 40.0 and over, adult: Secondary | ICD-10-CM

## 2020-09-02 DIAGNOSIS — E782 Mixed hyperlipidemia: Secondary | ICD-10-CM | POA: Insufficient documentation

## 2020-09-02 DIAGNOSIS — I10 Essential (primary) hypertension: Secondary | ICD-10-CM

## 2020-09-02 DIAGNOSIS — Z0289 Encounter for other administrative examinations: Secondary | ICD-10-CM

## 2020-09-02 DIAGNOSIS — E559 Vitamin D deficiency, unspecified: Secondary | ICD-10-CM | POA: Diagnosis not present

## 2020-09-03 LAB — CBC WITH DIFFERENTIAL/PLATELET
Basophils Absolute: 0 10*3/uL (ref 0.0–0.2)
Basos: 1 %
EOS (ABSOLUTE): 0.2 10*3/uL (ref 0.0–0.4)
Eos: 3 %
Hematocrit: 51.3 % — ABNORMAL HIGH (ref 34.0–46.6)
Hemoglobin: 17.1 g/dL — ABNORMAL HIGH (ref 11.1–15.9)
Immature Grans (Abs): 0 10*3/uL (ref 0.0–0.1)
Immature Granulocytes: 1 %
Lymphocytes Absolute: 1.5 10*3/uL (ref 0.7–3.1)
Lymphs: 18 %
MCH: 29 pg (ref 26.6–33.0)
MCHC: 33.3 g/dL (ref 31.5–35.7)
MCV: 87 fL (ref 79–97)
Monocytes Absolute: 0.5 10*3/uL (ref 0.1–0.9)
Monocytes: 6 %
Neutrophils Absolute: 6.3 10*3/uL (ref 1.4–7.0)
Neutrophils: 71 %
Platelets: 202 10*3/uL (ref 150–450)
RBC: 5.9 x10E6/uL — ABNORMAL HIGH (ref 3.77–5.28)
RDW: 13.1 % (ref 11.7–15.4)
WBC: 8.5 10*3/uL (ref 3.4–10.8)

## 2020-09-03 LAB — COMPREHENSIVE METABOLIC PANEL
ALT: 11 IU/L (ref 0–32)
AST: 17 IU/L (ref 0–40)
Albumin/Globulin Ratio: 1.5 (ref 1.2–2.2)
Albumin: 4.4 g/dL (ref 3.7–4.7)
Alkaline Phosphatase: 82 IU/L (ref 44–121)
BUN/Creatinine Ratio: 14 (ref 12–28)
BUN: 13 mg/dL (ref 8–27)
Bilirubin Total: 0.8 mg/dL (ref 0.0–1.2)
CO2: 26 mmol/L (ref 20–29)
Calcium: 9.2 mg/dL (ref 8.7–10.3)
Chloride: 104 mmol/L (ref 96–106)
Creatinine, Ser: 0.91 mg/dL (ref 0.57–1.00)
GFR calc Af Amer: 72 mL/min/{1.73_m2} (ref >59)
GFR calc non Af Amer: 63 mL/min/{1.73_m2} (ref >59)
Globulin, Total: 3 g/dL (ref 1.5–4.5)
Glucose: 100 mg/dL — ABNORMAL HIGH (ref 65–99)
Potassium: 4.5 mmol/L (ref 3.5–5.2)
Sodium: 141 mmol/L (ref 134–144)
Total Protein: 7.4 g/dL (ref 6.0–8.5)

## 2020-09-03 LAB — LIPID PANEL
Chol/HDL Ratio: 4.4 ratio (ref 0.0–4.4)
Cholesterol, Total: 199 mg/dL (ref 100–199)
HDL: 45 mg/dL (ref >39)
LDL Chol Calc (NIH): 119 mg/dL — ABNORMAL HIGH (ref 0–99)
Triglycerides: 202 mg/dL — ABNORMAL HIGH (ref 0–149)
VLDL Cholesterol Cal: 35 mg/dL (ref 5–40)

## 2020-09-03 LAB — T3: T3, Total: 111 ng/dL (ref 71–180)

## 2020-09-03 LAB — HEMOGLOBIN A1C
Est. average glucose Bld gHb Est-mCnc: 108 mg/dL
Hgb A1c MFr Bld: 5.4 % (ref 4.8–5.6)

## 2020-09-03 LAB — TSH: TSH: 0.963 u[IU]/mL (ref 0.450–4.500)

## 2020-09-03 LAB — INSULIN, RANDOM: INSULIN: 30.5 u[IU]/mL — ABNORMAL HIGH (ref 2.6–24.9)

## 2020-09-03 LAB — VITAMIN D 25 HYDROXY (VIT D DEFICIENCY, FRACTURES): Vit D, 25-Hydroxy: 65.1 ng/mL (ref 30.0–100.0)

## 2020-09-03 LAB — T4, FREE: Free T4: 1.2 ng/dL (ref 0.82–1.77)

## 2020-09-03 LAB — VITAMIN B12: Vitamin B-12: 508 pg/mL (ref 232–1245)

## 2020-09-07 NOTE — Progress Notes (Signed)
Dear Dr. Laurann Montana,   Thank you for referring [name] to our clinic. The following note includes my evaluation and treatment recommendations.  Chief Complaint:   OBESITY [name] (MR# [id number]) is a 74 y.o. female who presents for evaluation and treatment of obesity and related comorbidities. Current BMI is Body mass index is 42.6 kg/m. [name] has been struggling with her weight for many years and has been unsuccessful in either losing weight, maintaining weight loss, or reaching her healthy weight goal.  [name] is currently in the action stage of change and ready to dedicate time achieving and maintaining a healthier weight. [name] is interested in becoming our patient and working on intensive lifestyle modifications including (but not limited to) diet and exercise for weight loss.  [name] is married to [name].  She sometimes skips meals.  [name]'s habits were reviewed today and are as follows: Her family eats meals together, she thinks her family will eat healthier with her, she struggles with family and or coworkers weight loss sabotage, her desired weight loss is 100 pounds, she has been heavy most of her life, she started gaining weight in third grade, her heaviest weight ever was 263 pounds, she craves salty and sweet foods, she snacks frequently in the evenings, she skips lunch sometimes, she is frequently drinking liquids with calories, she frequently makes poor food choices and she struggles with emotional eating.  Depression Screen [name]'s Food and Mood (modified PHQ-9) score was 7.  Depression screen [facility] 2/9 09/02/2020  Decreased Interest 2  Down, Depressed, Hopeless -  PHQ - 2 Score 2  Altered sleeping 0  Tired, decreased energy 3  Change in appetite 1  Feeling bad or failure about yourself  1  Trouble concentrating 0  Moving slowly or fidgety/restless 0  Suicidal thoughts 0  PHQ-9 Score 7   Subjective:   1. Other fatigue, chronic fatigue  syndrome [name] admits to daytime somnolence and reports waking up still tired. Patent has a history of symptoms of daytime fatigue, morning fatigue and occasional snoring. [name] generally gets 6 hours of sleep per night, and states that she has generally restful sleep. Snoring is present. Apneic episodes are not present. Epworth Sleepiness Score is 14.    "I'm tired all the time."  She has history of chronic fatigue syndrome.  Denies regular exercise and admits to eating poorly.  Positive sleep screening for OSA.  Symptoms of chronic fatigue syndrome are stable.  No recent changes.  2. SOB (shortness of breath) [name] notes increasing shortness of breath with exercising and seems to be worsening over time with weight gain. She notes getting out of breath sooner with activity than she used to. This has gotten worse recently. [name] denies shortness of breath at rest or orthopnea.  Denies increase in acute symptoms or concerns.  Feels her increasing weight makes her feel short of breath with activity and worsens with increasing weight.  3. Prediabetes [name] has a diagnosis of prediabetes based on her elevated HgA1c and was informed this puts her at greater risk of developing diabetes. She continues to work on diet and exercise to decrease her risk of diabetes. She denies nausea or hypoglycemia.    Lab Results  Component Value Date   HGBA1C 5.4 09/02/2020   Lab Results  Component Value Date   INSULIN 30.5 (H) 09/02/2020   4. Hypertension, unspecified type Review: taking medications as instructed, no medication side effects noted, no chest pain on exertion, no  dyspnea on exertion, no swelling of ankles.  Management by Cardiology.  She is on Coreg daily.  ACE inhibitor caused cough.  Calcium channel blocker caused ankle swelling.    BP Readings from Last 3 Encounters:  09/02/20 (!) 159/87  08/13/19 (!) 142/94  06/09/19 132/72   5. Other hyperlipidemia [name] has hyperlipidemia and  has been trying to improve her cholesterol levels with intensive lifestyle modification including a low saturated fat diet, exercise and weight loss. She denies any chest pain, claudication or myalgias.  Management per Cardiology.  Diet controlled.  Unable to tolerate medications.  Lab Results  Component Value Date   ALT 11 09/02/2020   AST 17 09/02/2020   ALKPHOS 82 09/02/2020   BILITOT 0.8 09/02/2020   Lab Results  Component Value Date   CHOL 199 09/02/2020   HDL 45 09/02/2020   LDLCALC 119 (H) 09/02/2020   TRIG 202 (H) 09/02/2020   CHOLHDL 4.4 09/02/2020   6. Vitamin D deficiency She is currently taking OTC vitamin D 2,000 IU each day. She denies nausea, vomiting or muscle weakness.    7. Depression screening [name] was screened for depression as part of her new patient workup.  PHQ-9 is 7.  8. At increased risk of exposure to COVID-19 virus The patient is at higher risk of COVID-19 infection due to higher infection rates locally.  She has "been exposed 7 times" to close relatives who were positive, including her husband.  She did not get the vaccine nor did the rest of her family that are 1st degree relatives.  This is the patient's first visit at [facility].  The patient's NEW PATIENT PACKET that they filled out prior to today's office visit was reviewed at length and some information from that paperwork was also included within the following office visit note.    Included in the packet: current and past health history, medications, allergies, ROS, gynecologic history (women only), surgical history, family history, social history, weight history, weight loss surgery history (for those that have had weight loss surgery), nutritional evaluation, mood and food questionnaire along with a depression screening (PHQ9) on all patients, an Epworth questionnaire, sleep habits questionnaire, patient life and health improvement goals questionnaire. These will all be scanned  into the patient's chart under media.   During the visit, I independently reviewed the patient's EKG, bioimpedance scale results, and indirect calorimeter results. I used this information to tailor a meal plan for the patient that will help Yamina to lose weight and will improve her obesity-related conditions going forward. I performed a medically necessary appropriate examination and/or evaluation. I discussed the assessment and treatment plan with the patient. The patient was provided an opportunity to ask questions and all were answered. The patient agreed with the plan and demonstrated an understanding of the instructions. Labs were ordered at this visit and will be reviewed at the next visit unless more critical results need to be addressed immediately. Clinical information was updated and documented in the EMR.   Time spent on visit including pre-visit chart review and post-visit care was estimated to be 60-74 minutes.  A separate 15 minutes was spent on risk counseling (see above/below).   Assessment/Plan:   1. Other fatigue Krystn does feel that her weight is causing her energy to be lower than it should be. Fatigue may be related to obesity, depression or many other causes. Labs will be ordered, and in the meanwhile, Kaleigh will focus on self care including making healthy  food choices, increasing physical activity and focusing on stress reduction.  - EKG 12-Lead - CBC with Differential/Platelet - Comprehensive metabolic panel - Hemoglobin A1c - Lipid panel - T4, free - TSH - Vitamin B12 - VITAMIN D 25 Hydroxy (Vit-D Deficiency, Fractures) - Insulin, random - T3  2. SOB (shortness of breath) Hadeel does feel that she gets out of breath more easily that she used to when she exercises. Tarynn's shortness of breath appears to be obesity related and exercise induced. She has agreed to work on weight loss and gradually increase exercise to treat her exercise induced shortness of  breath. Will continue to monitor closely.  - CBC with Differential/Platelet - Comprehensive metabolic panel - Hemoglobin A1c - T4, free - TSH - VITAMIN D 25 Hydroxy (Vit-D Deficiency, Fractures) - Insulin, random - T3  3. Prediabetes Neely will continue to work on weight loss, exercise, and decreasing simple carbohydrates to help decrease the risk of diabetes.   - Hemoglobin A1c  4. Hypertension, unspecified type Ariyannah is working on healthy weight loss and exercise to improve blood pressure control. We will watch for signs of hypotension as she continues her lifestyle modifications.  She has an appointment with Dr. Sallyanne Kuster on 09/10/2020.  Blood pressure is above goal.  She will check it at home and bring log to Cardiology office visit.  She desires for them to manage.  - CBC with Differential/Platelet - Comprehensive metabolic panel - Lipid panel - T4, free - TSH - T3  5. Other hyperlipidemia Cardiovascular risk and specific lipid/LDL goals reviewed.  We discussed several lifestyle modifications today and Tenzin will continue to work on diet, exercise and weight loss efforts. Orders and follow up as documented in patient record.  Check labs.  Diet controlled currently.  Had side effect to statins.  Continue management via Dr. Sallyanne Kuster.  Will continue to follow.  Counseling Intensive lifestyle modifications are the first line treatment for this issue. . Dietary changes: Increase soluble fiber. Decrease simple carbohydrates. . Exercise changes: Moderate to vigorous-intensity aerobic activity 150 minutes per week if tolerated. . Lipid-lowering medications: see documented in medical record.  - Comprehensive metabolic panel - Lipid panel  6. Vitamin D deficiency Low Vitamin D level contributes to fatigue and are associated with obesity, breast, and colon cancer. She agrees to continue to take OTC Vitamin D @2 ,000 IU daily and will follow-up for routine testing of Vitamin D, at  least 2-3 times per year to avoid over-replacement.  Will check vitamin D level today.  - VITAMIN D 25 Hydroxy (Vit-D Deficiency, Fractures)  7. Depression screening Behavior modification techniques were discussed today to help [facility] deal with her emotional/non-hunger eating behaviors.  Orders and follow up as documented in patient record.   8. At increased risk of exposure to COVID-19 virus Mayreli was given approximately 15 minutes of COVID prevention counseling today Counseling  COVID-19 is a respiratory infection that is caused by a virus. It can cause serious infections, such as pneumonia, acute respiratory distress syndrome, acute respiratory failure, or sepsis.  You are more likely to develop a serious illness if you are 66 years of age or older, have a weak immune system, live in a nursing home, have chronic disease, or have obesity.  Get vaccinated as soon as they are available to you.  For our most current information, please visit [email or web address].  Wash your hands often with soap and water for 20 seconds. If soap and water are not available, use alcohol-based hand sanitizer.  Wear a face mask. Make sure your mask covers your nose and mouth.  Maintain at least 6 feet distance from others when in public.  Get help right away if  You have trouble breathing, chest pain, confusion, or other concerning symptoms.  Repetitive spaced learning was employed today to elicit superior memory formation and behavioral change.  9. Class 3 severe obesity with serious comorbidity and body mass index (BMI) of 40.0 to 44.9 in adult, unspecified obesity type ([place])  Caedence is currently in the action stage of change and her goal is to continue with weight loss efforts. I recommend Dorotha begin the structured treatment plan as follows:  She has agreed to the Category 2 Plan.  Exercise goals: As is.   Behavioral modification strategies: increasing lean protein intake, meal  planning and cooking strategies, keeping healthy foods in the home and planning for success.  She was informed of the importance of frequent follow-up visits to maximize her success with intensive lifestyle modifications for her multiple health conditions. She was informed we would discuss her lab results at her next visit unless there is a critical issue that needs to be addressed sooner. Zavannah agreed to keep her next visit at the agreed upon time to discuss these results.  Objective:   Blood pressure (!) 159/87, pulse 90, temperature 98.8 F (37.1 C), height 5\' 5"  (1.651 m), weight 256 lb (116.1 kg), SpO2 97 %. Body mass index is 42.6 kg/m.  Indirect Calorimeter completed today shows a VO2 of 257 and a REE of 1792.  Her calculated basal metabolic rate is 7116 thus her basal metabolic rate is better than expected.  General: Cooperative, alert, well developed, in no acute distress. HEENT: Conjunctivae and lids unremarkable. Cardiovascular: Regular rhythm.  Lungs: Normal work of breathing. Neurologic: No focal deficits.   Lab Results  Component Value Date   CREATININE 0.91 09/02/2020   BUN 13 09/02/2020   NA 141 09/02/2020   K 4.5 09/02/2020   CL 104 09/02/2020   CO2 26 09/02/2020   Lab Results  Component Value Date   ALT 11 09/02/2020   AST 17 09/02/2020   ALKPHOS 82 09/02/2020   BILITOT 0.8 09/02/2020   Lab Results  Component Value Date   HGBA1C 5.4 09/02/2020   HGBA1C 5.3 04/17/2018   Lab Results  Component Value Date   INSULIN 30.5 (H) 09/02/2020   Lab Results  Component Value Date   TSH 0.963 09/02/2020   Lab Results  Component Value Date   CHOL 199 09/02/2020   HDL 45 09/02/2020   LDLCALC 119 (H) 09/02/2020   TRIG 202 (H) 09/02/2020   CHOLHDL 4.4 09/02/2020   Lab Results  Component Value Date   WBC 8.5 09/02/2020   HGB 17.1 (H) 09/02/2020   HCT 51.3 (H) 09/02/2020   MCV 87 09/02/2020   PLT 202 09/02/2020   Attestation Statements:   Reviewed by  clinician on day of visit: allergies, medications, problem list, medical history, surgical history, family history, social history, and previous encounter notes.  I, Water quality scientist, CMA, am acting as Location manager for [employer], DO.  I have reviewed the above documentation for accuracy and completeness, and I agree with the above. -  Mellody Dance, DO

## 2020-09-10 ENCOUNTER — Ambulatory Visit (INDEPENDENT_AMBULATORY_CARE_PROVIDER_SITE_OTHER): Payer: Medicare Other | Admitting: Cardiovascular Disease

## 2020-09-10 ENCOUNTER — Other Ambulatory Visit: Payer: Self-pay

## 2020-09-10 ENCOUNTER — Encounter: Payer: Self-pay | Admitting: Cardiovascular Disease

## 2020-09-10 VITALS — BP 133/82 | HR 75 | Ht 65.0 in | Wt 255.8 lb

## 2020-09-10 DIAGNOSIS — I10 Essential (primary) hypertension: Secondary | ICD-10-CM | POA: Diagnosis not present

## 2020-09-10 DIAGNOSIS — R0609 Other forms of dyspnea: Secondary | ICD-10-CM

## 2020-09-10 DIAGNOSIS — E7800 Pure hypercholesterolemia, unspecified: Secondary | ICD-10-CM | POA: Diagnosis not present

## 2020-09-10 DIAGNOSIS — R0600 Dyspnea, unspecified: Secondary | ICD-10-CM | POA: Diagnosis not present

## 2020-09-10 NOTE — Patient Instructions (Signed)

## 2020-09-10 NOTE — Progress Notes (Signed)
Cardiology Office Note    Date:  09/11/2020   ID:  [name], DOB 1946-10-08, MRN [id number]  PCP:  Kelton Pillar, MD  Cardiologist:   Sanda Klein, MD   Chief Complaint  Patient presents with  . Hypertension    History of Present Illness:  [name] is a 74 y.o. female with obesity and associated hypertension and hyperlipidemia returning for follow-up.   She generally feels well but complains of feeling short of breath "a lot".  This happens when she goes grocery shopping sometimes even when she just takes a shower.  She remains morbidly obese.  Her blood pressure control has been good on the current medications and she has only minimal ankle edema (amlodipine could not be used due to severe ankle swelling).  She has a history of dry cough with ACE inhibitors.  She has a history of problems with valsartan and losartan (diarrhea).  She has engaged in a wellness program with Dr. Raliegh Scarlet.  She is optimistic that this will help her lose weight.  The patient specifically denies any chest pain at rest or with exertion, dyspnea at rest , orthopnea, paroxysmal nocturnal dyspnea, syncope, palpitations, focal neurological deficits, intermittent claudication, significant lower extremity edema, unexplained weight gain, cough, hemoptysis or wheezing.  Her LDL cholesterol remains minimally elevated at 119.  She did not tolerate statins even when dosed on an intermittent schedule.  In the past, she has periods of dyspnea that had clear association with gastroesophageal reflux.   She had a normal chest CT angiogram for presumed pulmonary embolism and a normal nuclear stress test in June 2018.  The chest CT, while not a coronary angiogram study, was remarkable for the absence of coronary or aortic calcification.  She had a normal nuclear stress test in 2018.  She had an echocardiogram in September 2019 that showed normal left ventricular systolic function with mild diastolic  dysfunction.  She has a strong family history of coronary disease and a personal history of hypertension and hyperlipidemia and borderline diabetes mellitus.  She underwent cardiac catheterization in 2007 with angiographically patent coronary arteries without meaningful atherosclerosis. She has been intolerant to statin medications and fibrates due to severe myalgia.   Past Medical History:  Diagnosis Date  . Anxiety   . Back pain   . Depression   . Diplopia   . GERD (gastroesophageal reflux disease)   . HTN (hypertension)   . Hyperlipidemia   . Hypertension   . Normal cardiac stress test 2011   echo stress with normal LV function., Normal 2-D echo 2008  . Obesity   . Shortness of breath on exertion   . 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 Swelling   . Vitamin D deficiency     Past Surgical History:  Procedure Laterality Date  . ABDOMINAL HYSTERECTOMY  2001  . APPENDECTOMY  1957  . CARDIAC CATHETERIZATION  2007   Patent coronary arteries  . KNEE ARTHROSCOPY Right 1999  . LAPAROSCOPIC CHOLECYSTECTOMY  2007  . TONSILECTOMY/ADENOIDECTOMY WITH MYRINGOTOMY  1963  . TOTAL KNEE ARTHROPLASTY Right 07/21/2016   Procedure: RIGHT TOTAL KNEE ARTHROPLASTY;  Surgeon: Netta Cedars, MD;  Location: [facility];  Service: Orthopedics;  Laterality: Right;  . TOTAL KNEE ARTHROPLASTY     right    Current Medications: Outpatient Medications Prior to Visit  Medication Sig Dispense Refill  . b complex vitamins tablet Take 1 tablet by mouth daily.    . calcium acetate, Phos  Binder, (PHOSLYRA) 667 MG/5ML SOLN Take by mouth 3 (three) times daily with meals.    . carvedilol (COREG) 6.25 MG tablet TAKE 1 TABLET BY MOUTH TWICE A DAY 180 tablet 1  . Cholecalciferol (VITAMIN D-3) 1000 UNITS CAPS Take 2,000 Units by mouth daily.    . diphenhydrAMINE (ALLERGY) 25 mg capsule Take 25 mg by mouth every 6 (six) hours as needed for allergies.     Marland Kitchen ELDERBERRY PO Take by mouth.    Marland Kitchen FLAXSEED, LINSEED, PO Take  1,000 mg by mouth daily.    Marland Kitchen KRILL OIL PO Take 1 capsule by mouth daily.     . Lecith-Inosi-Chol-B12-Liver (LIVERITE PO) Take 2 tablets by mouth at bedtime.    Marland Kitchen OVER THE COUNTER MEDICATION Take 1 tablet by mouth daily. 1000mg  milk thissel    . pantoprazole (PROTONIX) 40 MG tablet Take 1 tablet (40 mg total) by mouth daily. 90 tablet 3  . sertraline (ZOLOFT) 50 MG tablet Take 25 mg by mouth daily.    . Turmeric Curcumin 500 MG CAPS Take 1 capsule by mouth daily.    . vitamin C (ASCORBIC ACID) 500 MG tablet Take 500 mg by mouth daily.    . methocarbamol (ROBAXIN) 500 MG tablet Take 1 tablet (500 mg total) by mouth 3 (three) times daily as needed. 60 tablet 1   No facility-administered medications prior to visit.     Allergies:   Statins, Ace inhibitors, Losartan, Tricor [fenofibrate], and Penicillins   Social History   Socioeconomic History  . Marital status: Married    Spouse name: Not on file  . Number of children: 1  . Years of education: 74  . Highest education level: Bachelor's degree (e.g., BA, AB, BS)  Occupational History  . Occupation: Retired  Tobacco Use  . Smoking status: Never Smoker  . Smokeless tobacco: Never Used  Vaping Use  . Vaping Use: Never used  Substance and Sexual Activity  . Alcohol use: No  . Drug use: No  . Sexual activity: Not on file  Other Topics Concern  . Not on file  Social History Narrative   Lives at home with her husband.   Right-handed.   1-2 cups caffeine per day.   Social Determinants of Health   Financial Resource Strain:   . Difficulty of Paying Living Expenses: Not on file  Food Insecurity:   . Worried About Charity fundraiser in the Last Year: Not on file  . Ran Out of Food in the Last Year: Not on file  Transportation Needs:   . Lack of Transportation (Medical): Not on file  . Lack of Transportation (Non-Medical): Not on file  Physical Activity:   . Days of Exercise per Week: Not on file  . Minutes of Exercise per  Session: Not on file  Stress:   . Feeling of Stress : Not on file  Social Connections:   . Frequency of Communication with Friends and Family: Not on file  . Frequency of Social Gatherings with Friends and Family: Not on file  . Attends Religious Services: Not on file  . Active Member of Clubs or Organizations: Not on file  . Attends Archivist Meetings: Not on file  . Marital Status: Not on file     Family History:  The patient's family history includes Anxiety disorder in her father; Cancer in her father; Colon cancer (age of onset: 50) in her sister; Colon cancer (age of onset: 38) in her father; Dementia  in her mother; Depression in her father and mother; Heart disease (age of onset: 77) in her mother; Heart failure in her father; Hyperlipidemia in her mother; Hypertension in her father and mother; Prostate cancer in her father; Sudden death (age of onset: 28) in her paternal grandfather; Sudden death (age of onset: 36) in her maternal grandfather; Thyroid disease in her mother.   ROS:   Please see the history of present illness.    ROS all other systems are reviewed and are negative  PHYSICAL EXAM:   VS:  BP 133/82   Pulse 75   Ht 5\' 5"  (1.651 m)   Wt 255 lb 12.8 oz (116 kg)   BMI 42.57 kg/m       General: Alert, oriented x3, no distress, morbidly obese Head: no evidence of trauma, PERRL, EOMI, no exophtalmos or lid lag, no myxedema, no xanthelasma; normal ears, nose and oropharynx Neck: normal jugular venous pulsations and no hepatojugular reflux; brisk carotid pulses without delay and no carotid bruits Chest: clear to auscultation, no signs of consolidation by percussion or palpation, normal fremitus, symmetrical and full respiratory excursions Cardiovascular: normal position and quality of the apical impulse, regular rhythm, normal first and second heart sounds, no murmurs, rubs or gallops Abdomen: no tenderness or distention, no masses by palpation, no abnormal  pulsatility or arterial bruits, normal bowel sounds, no hepatosplenomegaly Extremities: no clubbing, cyanosis or edema; 2+ radial, ulnar and brachial pulses bilaterally; 2+ right femoral, posterior tibial and dorsalis pedis pulses; 2+ left femoral, posterior tibial and dorsalis pedis pulses; no subclavian or femoral bruits Neurological: grossly nonfocal Psych: Normal mood and affect    Wt Readings from Last 3 Encounters:  09/10/20 255 lb 12.8 oz (116 kg)  09/02/20 256 lb (116.1 kg)  08/13/19 260 lb (117.9 kg)      Studies/Labs Reviewed:   ECHO 08/28/2018: - Left ventricle: The cavity size was normal. Wall thickness was  increased in a pattern of moderate LVH. Systolic function was  normal. The estimated ejection fraction was in the range of 60%  to 65%. Wall motion was normal; there were no regional wall  motion abnormalities. Doppler parameters are consistent with  abnormal left ventricular relaxation (grade 1 diastolic  dysfunction). The E/e&' ratio is between 8-15, suggesting  indeterminate LV filling pressure.  - Mitral valve: Mildly thickened leaflets . There was trivial  regurgitation.  - Left atrium: The atrium was normal in size.  - Tricuspid valve: There was trivial regurgitation.  - Pulmonary arteries: PA peak pressure: 27 mm Hg (S).  - Inferior vena cava: The vessel was normal in size. The  respirophasic diameter changes were in the normal range (>= 50%),  consistent with normal central venous pressure.   Impressions:   - LVEF 60-65%, moderate LVH, normal wall motion, grade 1 DD,  indeterminate LV filling pressure, trivial MR, norma LA size,  trivial TR, RVSP 27 mmHg, normal IVC.   EKG:  EKG is ordered today.  It shows NSR, normal tracing,  QTc 417 ms Recent Labs: Dr. Laurann Montana, 06/25/2019 Total cholesterol 208, LDL 123, HDL 48, triglycerides of 91 Hemoglobin A1c 5.3%, creatinine 0.88, potassium 4.5, normal liver function  tests  09/02/2020 Total cholesterol 199, HDL 45, LDL 119, triglycerides 202 Hemoglobin A1c 5.4%, creatinine 0.91, potassium 4.5, normal liver function tests and normal TSH  ASSESSMENT:    1. Essential hypertension   2. Hypercholesterolemia   3. Morbid obesity ([place])   4. Exertional dyspnea  PLAN:  In order of problems listed above:  1. HTN: Had issues with calcium channel blockers, ACE inhibitors and ARB, but seems to be tolerating the carvedilol well.  We will continue the same medication. 2. HLP: In the absence of CAD or PAD or diabetes mellitus her current lipid profile, while not optimal, does not require pharmacological therapy.  Continue to pay attention to a diet low in saturated fat, sugars and carbohydrates with high glycemic index with increased intake of unsaturated fat and lean protein. 3. Morbid obesity: Engaged in a new wellness/weight loss program.  She does not have symptoms of daytime hypersomnolence or other indication of sleep apnea. 4. Exertional dyspnea: Previous cardiac work-up with echocardiography and coronary angiography as well as CT of the chest with contrast have not shown abnormalities that could be implicated.  Clearly her weight is part of the problem.  Note relationship with symptoms of GERD in the past.  Consider pulmonary evaluation.    Medication Adjustments/Labs and Tests Ordered: Current medicines are reviewed at length with the patient today.  Concerns regarding medicines are outlined above.  Medication changes, Labs and Tests ordered today are listed in the Patient Instructions below. Patient Instructions  Medication Instructions:  No changes *If you need a refill on your cardiac medications before your next appointment, please call your pharmacy*   Lab Work: None ordered If you have labs (blood work) drawn today and your tests are completely normal, you will receive your results only by: Marland Kitchen MyChart Message (if you have MyChart) OR . A  paper copy in the mail If you have any lab test that is abnormal or we need to change your treatment, we will call you to review the results.   Testing/Procedures: None ordered   Follow-Up: At [facility], you and your health needs are our priority.  As part of our continuing mission to provide you with exceptional heart care, we have created designated Provider Care Teams.  These Care Teams include your primary Cardiologist (physician) and Advanced Practice Providers (APPs -  Physician Assistants and Nurse Practitioners) who all work together to provide you with the care you need, when you need it.  We recommend signing up for the patient portal called "MyChart".  Sign up information is provided on this After Visit Summary.  MyChart is used to connect with patients for Virtual Visits (Telemedicine).  Patients are able to view lab/test results, encounter notes, upcoming appointments, etc.  Non-urgent messages can be sent to your provider as well.   To learn more about what you can do with MyChart, go to [email or web address].    Your next appointment:   12 month(s)  The format for your next appointment:   In Person  Provider:   You may see Sanda Klein, MD or one of the following Advanced Practice Providers on your designated Care Team:    Almyra Deforest, PA-C  Fabian Sharp, [place] or   Roby Lofts, PA-C       Signed, Sanda Klein, MD  09/11/2020 9:24 AM    [address], [place],   [address] Phone: [phone]; Fax: [phone]

## 2020-09-16 ENCOUNTER — Ambulatory Visit (INDEPENDENT_AMBULATORY_CARE_PROVIDER_SITE_OTHER): Payer: Medicare Other | Admitting: Family Medicine

## 2020-09-16 ENCOUNTER — Encounter (INDEPENDENT_AMBULATORY_CARE_PROVIDER_SITE_OTHER): Payer: Self-pay | Admitting: Family Medicine

## 2020-09-16 ENCOUNTER — Other Ambulatory Visit: Payer: Self-pay

## 2020-09-16 VITALS — BP 131/74 | HR 96 | Temp 98.3°F | Ht 65.0 in | Wt 249.0 lb

## 2020-09-16 DIAGNOSIS — R7303 Prediabetes: Secondary | ICD-10-CM | POA: Diagnosis not present

## 2020-09-16 DIAGNOSIS — E7849 Other hyperlipidemia: Secondary | ICD-10-CM

## 2020-09-16 DIAGNOSIS — Z6841 Body Mass Index (BMI) 40.0 and over, adult: Secondary | ICD-10-CM | POA: Diagnosis not present

## 2020-09-16 DIAGNOSIS — E559 Vitamin D deficiency, unspecified: Secondary | ICD-10-CM

## 2020-09-16 DIAGNOSIS — I10 Essential (primary) hypertension: Secondary | ICD-10-CM

## 2020-09-16 MED ORDER — VITAMIN D-3 25 MCG (1000 UT) PO CAPS: 2000.0000 [IU] | ORAL_CAPSULE | Freq: Every day | ORAL | Status: DC

## 2020-09-20 NOTE — Progress Notes (Signed)
Chief Complaint:   OBESITY [name] is here to discuss her progress with her obesity treatment plan along with follow-up of her obesity related diagnoses. [name] is on the Category 2 Plan and states she is following her eating plan approximately 70% of the time. [name] states she is exercising for 0 minutes 0 times per week.  Today's visit was #: 2 Starting weight: 256 lbs Starting date: 09/02/2020 Today's weight: 249 lbs Today's date: 09/16/2020 Total lbs lost to date: 7 lbs Total lbs lost since last in-office visit: 7 lbs  Interim History: [name] says she did not do well the first week, so she buckled down during the second week.  She is following the plan this week.  She says that before, she could not stand to do dishes, but over the past 6 days, she has been able to do them.  Her energy has picked up some as well.  Hunger is controlled all day, but some snacking at night - nuts.  Cravings - none.  Denies constipation.  She says she has been very regular.  Assessment/Plan:   1. Prediabetes Goal is HgbA1c < 5.7 and insulin level closer to 5.  She is not on any medications at this time.  Lab Results  Component Value Date   HGBA1C 5.4 09/02/2020   Lab Results  Component Value Date   INSULIN 30.5 (H) 09/02/2020   Plan:  Continue prudent nutritional plan and weight loss.  Will continue to monitor.  2. Other hyperlipidemia [name] is not on any medications for cholesterol at this time.  LDL is 119 and triglycerides are 202.  Lab Results  Component Value Date   ALT 11 09/02/2020   AST 17 09/02/2020   ALKPHOS 82 09/02/2020   BILITOT 0.8 09/02/2020   Lab Results  Component Value Date   CHOL 199 09/02/2020   HDL 45 09/02/2020   LDLCALC 119 (H) 09/02/2020   TRIG 202 (H) 09/02/2020   CHOLHDL 4.4 09/02/2020   Plan:  [name] will continue to lower sat/trans fats through prudent nutritional plan and continue to work on weight loss.  3. Vitamin D deficiency Current  vitamin D is 65.1, tested on 09/02/2020. Not at goal. Optimal goal > 50 ng/dL. There is also evidence to support a goal of >70 ng/dL in patients with cancer and heart disease.   Plan: Start Vitamin D @50 ,000 IU every week with follow-up for routine testing of Vitamin D at least 2-3 times per year to avoid over-replacement.  -Start Cholecalciferol (VITAMIN D-3) 25 MCG (1000 UT) CAPS; Take 2 capsules (2,000 Units total) by mouth daily. Pt actually takes 5,000 IU BID  Dispense: 60 capsule  4. Hypertension, unspecified type At last visit, blood pressure was elevated.  At goal today. Medications: Coreg. We will monitor for hypotension with continued weight loss.  Denies chest pain, shortness of breath.  BP Readings from Last 3 Encounters:  09/16/20 131/74  09/10/20 133/82  09/02/20 (!) 159/87   Lab Results  Component Value Date   CREATININE 0.91 09/02/2020   Plan:  Blood pressure is at goal today.  Continue medications at current dose.  Continue prudent nutritional plan, weight loss.  Continue low salt diet.  5. Class 3 severe obesity with serious comorbidity and body mass index (BMI) of 40.0 to 44.9 in adult, unspecified obesity type ([place])  [name] is currently in the action stage of change. As such, her goal is to continue with weight loss efforts. She has agreed to  the Category 2 Plan.   Exercise goals: As is.  Behavioral modification strategies: increasing lean protein intake, decreasing simple carbohydrates, increasing water intake, meal planning and cooking strategies, keeping healthy foods in the home and planning for success.  [name] has agreed to follow-up with our clinic in 2 weeks. She was informed of the importance of frequent follow-up visits to maximize her success with intensive lifestyle modifications for her multiple health conditions.   Objective:   Blood pressure 131/74, pulse 96, temperature 98.3 F (36.8 C), height 5\' 5"  (1.651 m), weight 249 lb (112.9 kg), SpO2 96  %. Body mass index is 41.44 kg/m.  General: Cooperative, alert, well developed, in no acute distress. HEENT: Conjunctivae and lids unremarkable. Cardiovascular: Regular rhythm.  Lungs: Normal work of breathing. Neurologic: No focal deficits.   Lab Results  Component Value Date   CREATININE 0.91 09/02/2020   BUN 13 09/02/2020   NA 141 09/02/2020   K 4.5 09/02/2020   CL 104 09/02/2020   CO2 26 09/02/2020   Lab Results  Component Value Date   ALT 11 09/02/2020   AST 17 09/02/2020   ALKPHOS 82 09/02/2020   BILITOT 0.8 09/02/2020   Lab Results  Component Value Date   HGBA1C 5.4 09/02/2020   HGBA1C 5.3 04/17/2018   Lab Results  Component Value Date   INSULIN 30.5 (H) 09/02/2020   Lab Results  Component Value Date   TSH 0.963 09/02/2020   Lab Results  Component Value Date   CHOL 199 09/02/2020   HDL 45 09/02/2020   LDLCALC 119 (H) 09/02/2020   TRIG 202 (H) 09/02/2020   CHOLHDL 4.4 09/02/2020   Lab Results  Component Value Date   WBC 8.5 09/02/2020   HGB 17.1 (H) 09/02/2020   HCT 51.3 (H) 09/02/2020   MCV 87 09/02/2020   PLT 202 09/02/2020   Obesity Behavioral Intervention:   Approximately 15 minutes were spent on the discussion below.  ASK: We discussed the diagnosis of obesity with [name] today and [name] agreed to give [place] permission to discuss obesity behavioral modification therapy today.  ASSESS: [name] has the diagnosis of obesity and her BMI today is 41.5. [name] is in the action stage of change.   ADVISE: [name] was educated on the multiple health risks of obesity as well as the benefit of weight loss to improve her health. She was advised of the need for long term treatment and the importance of lifestyle modifications to improve her current health and to decrease her risk of future health problems.  AGREE: Multiple dietary modification options and treatment options were discussed and [name] agreed to follow the recommendations documented in  the above note.  ARRANGE: [name] was educated on the importance of frequent visits to treat obesity as outlined per CMS and USPSTF guidelines and agreed to schedule her next follow up appointment today.  Attestation Statements:   Reviewed by clinician on day of visit: allergies, medications, problem list, medical history, surgical history, family history, social history, and previous encounter notes.  I, Water quality scientist, CMA, am acting as Location manager for [employer], DO.  I have reviewed the above documentation for accuracy and completeness, and I agree with the above. Marjory Sneddon, D.O.  The [place] was signed into law in 2016 which includes the topic of electronic health records.  This provides immediate access to information in MyChart.  This includes consultation notes, operative notes, office notes, lab results and pathology reports.  If  you have any questions about what you read please let us know at your next visit so we can discuss your concerns and take corrective action if need be.  We are right here with you.

## 2020-10-04 ENCOUNTER — Ambulatory Visit (INDEPENDENT_AMBULATORY_CARE_PROVIDER_SITE_OTHER): Payer: Medicare Other | Admitting: Family Medicine

## 2020-10-04 ENCOUNTER — Encounter (INDEPENDENT_AMBULATORY_CARE_PROVIDER_SITE_OTHER): Payer: Self-pay | Admitting: Family Medicine

## 2020-10-04 ENCOUNTER — Other Ambulatory Visit: Payer: Self-pay

## 2020-10-04 VITALS — BP 130/82 | HR 101 | Temp 98.7°F | Ht 65.0 in | Wt 246.0 lb

## 2020-10-04 DIAGNOSIS — E559 Vitamin D deficiency, unspecified: Secondary | ICD-10-CM | POA: Diagnosis not present

## 2020-10-04 DIAGNOSIS — Z6841 Body Mass Index (BMI) 40.0 and over, adult: Secondary | ICD-10-CM

## 2020-10-04 MED ORDER — VITAMIN D (ERGOCALCIFEROL) 1.25 MG (50000 UNIT) PO CAPS: 50000.0000 [IU] | ORAL_CAPSULE | ORAL | 0 refills | Status: DC

## 2020-10-06 NOTE — Progress Notes (Signed)
Chief Complaint:   OBESITY [name] is here to discuss her progress with her obesity treatment plan along with follow-up of her obesity related diagnoses. [name] is on the Category 2 Plan and states she is following her eating plan approximately 85-90% of the time. [name] states she is walking around the house for most of the day 2-3 days per week.  Today's visit was #: 3 Starting weight: 256 lbs Starting date: 09/02/2020 Today's weight: 246 lbs Today's date: 10/04/2020 Total lbs lost to date: 10 lbs Total lbs lost since last in-office visit: 3 lbs Total weight loss percentage to date: -3.91%  Interim History: [name] says she had a birthday within the last 2 weeks.  She ate out a couple of times.  She is able to food prep and stand for longer time periods now.  She says she has been sleeping better.  She reports being much less tired than before.  Hunger is controlled.  She has cravings for milk at night.    Plan:  Recommend Fairlife skim milk 2 cups at night for a snack.  Assessment/Plan:   1. Vitamin D deficiency [name] has a history of Vitamin D deficiency with resultant generalized fatigue as her primary symptom.  she is taking vitamin D 10,000 IU daily for this deficiency and tolerating it well without side-effect.  Most recent Vitamin D lab reviewed-  level: 65.1 on 09/02/2020.  Plan:  - Discussed importance of vitamin D to their health and well-being.   - possible symptoms of low Vitamin D can be low energy, depressed mood, muscle aches, joint aches, osteoporosis etc.  - low Vitamin D levels may be linked to an increased risk of cardiovascular events and even increased risk of cancers- such as colon and breast.   - I recommend pt take a weekly prescription vit D- see script below    - this may be a lifelong thing, and we will need to monitor levels regularly to keep within normal limits.   - weight loss will likely improve availability of vitamin D, thus  encouraged [name] to continue with meal plan and their weight loss efforts to further improve this condition   -Refill Vitamin D, Ergocalciferol, (DRISDOL) 1.25 MG (50000 UNIT) CAPS capsule; Take 1 capsule (50,000 Units total) by mouth every 7 (seven) days.  Dispense: 4 capsule; Refill: 0   2. Class 3 severe obesity with serious comorbidity and body mass index (BMI) of 40.0 to 44.9 in adult, unspecified obesity type ([name])  [name] is currently in the action stage of change. As such, her goal is to continue with weight loss efforts. She has agreed to the Category 2 Plan.   Exercise goals: 10 minutes of walking per day.  Behavioral modification strategies: increasing lean protein intake, meal planning and cooking strategies, better snacking choices and planning for success.  [name] has agreed to follow-up with our clinic in 2 weeks. She was informed of the importance of frequent follow-up visits to maximize her success with intensive lifestyle modifications for her multiple health conditions.   Objective:   Blood pressure 130/82, pulse (!) 101, temperature 98.7 F (37.1 C), height 5\' 5"  (1.651 m), weight 246 lb (111.6 kg), SpO2 94 %. Body mass index is 40.94 kg/m.  General: Cooperative, alert, well developed, in no acute distress. HEENT: Conjunctivae and lids unremarkable. Cardiovascular: Regular rhythm.  Lungs: Normal work of breathing. Neurologic: No focal deficits.   Lab Results  Component Value Date   CREATININE  0.91 09/02/2020   BUN 13 09/02/2020   NA 141 09/02/2020   K 4.5 09/02/2020   CL 104 09/02/2020   CO2 26 09/02/2020   Lab Results  Component Value Date   ALT 11 09/02/2020   AST 17 09/02/2020   ALKPHOS 82 09/02/2020   BILITOT 0.8 09/02/2020   Lab Results  Component Value Date   HGBA1C 5.4 09/02/2020   HGBA1C 5.3 04/17/2018   Lab Results  Component Value Date   INSULIN 30.5 (H) 09/02/2020   Lab Results  Component Value Date   TSH 0.963 09/02/2020    Lab Results  Component Value Date   CHOL 199 09/02/2020   HDL 45 09/02/2020   LDLCALC 119 (H) 09/02/2020   TRIG 202 (H) 09/02/2020   CHOLHDL 4.4 09/02/2020   Lab Results  Component Value Date   WBC 8.5 09/02/2020   HGB 17.1 (H) 09/02/2020   HCT 51.3 (H) 09/02/2020   MCV 87 09/02/2020   PLT 202 09/02/2020   Obesity Behavioral Intervention:   Approximately 15 minutes were spent on the discussion below.  ASK: We discussed the diagnosis of obesity with Jeselle today and [name] agreed to give [place] permission to discuss obesity behavioral modification therapy today.  ASSESS: [name] has the diagnosis of obesity and her BMI today is 41.0. [name] is in the action stage of change.   ADVISE: [name] was educated on the multiple health risks of obesity as well as the benefit of weight loss to improve her health. She was advised of the need for long term treatment and the importance of lifestyle modifications to improve her current health and to decrease her risk of future health problems.  AGREE: Multiple dietary modification options and treatment options were discussed and [name] agreed to follow the recommendations documented in the above note.  ARRANGE: [name] was educated on the importance of frequent visits to treat obesity as outlined per CMS and USPSTF guidelines and agreed to schedule her next follow up appointment today.  Attestation Statements:   Reviewed by clinician on day of visit: allergies, medications, problem list, medical history, surgical history, family history, social history, and previous encounter notes.  I, Water quality scientist, CMA, am acting as Location manager for [employer], DO.  I have reviewed the above documentation for accuracy and completeness, and I agree with the above. Marjory Sneddon, D.O.  The [place] was signed into law in 2016 which includes the topic of electronic health records.  This provides immediate access to  information in MyChart.  This includes consultation notes, operative notes, office notes, lab results and pathology reports.  If you have any questions about what you read please let us know at your next visit so we can discuss your concerns and take corrective action if need be.  We are right here with you.

## 2020-10-20 ENCOUNTER — Encounter (INDEPENDENT_AMBULATORY_CARE_PROVIDER_SITE_OTHER): Payer: Self-pay | Admitting: Family Medicine

## 2020-10-20 ENCOUNTER — Ambulatory Visit (INDEPENDENT_AMBULATORY_CARE_PROVIDER_SITE_OTHER): Payer: Medicare Other | Admitting: Family Medicine

## 2020-10-20 ENCOUNTER — Other Ambulatory Visit: Payer: Self-pay

## 2020-10-20 VITALS — BP 129/77 | HR 70 | Temp 98.5°F | Ht 65.0 in | Wt 244.0 lb

## 2020-10-20 DIAGNOSIS — E559 Vitamin D deficiency, unspecified: Secondary | ICD-10-CM | POA: Diagnosis not present

## 2020-10-20 DIAGNOSIS — Z6841 Body Mass Index (BMI) 40.0 and over, adult: Secondary | ICD-10-CM | POA: Diagnosis not present

## 2020-10-20 MED ORDER — VITAMIN D (ERGOCALCIFEROL) 1.25 MG (50000 UNIT) PO CAPS: 50000.0000 [IU] | ORAL_CAPSULE | ORAL | 0 refills | Status: DC

## 2020-10-26 NOTE — Progress Notes (Signed)
Chief Complaint:   OBESITY [name] is here to discuss her progress with her obesity treatment plan along with follow-up of her obesity related diagnoses. [name] is on the Category 2 Plan and states she is following her eating plan approximately 80% of the time. [name] states she is walking for 10 minutes 5-6 times per week.  Today's visit was #: 4 Starting weight: 256 lbs Starting date: 09/02/2020 Today's weight: 244 lbs Today's date: 10/20/2020 Total lbs lost to date: 12 lbs Total lbs lost since last in-office visit: 2 lbs Total weight loss percentage to date: -4.69%  Interim History: [name] says she "stuck to the plan as closely as possible".  She had to visit with her sister for 4 days, but was able to try the best she could to stick with it.  At last office visit, we recommended she have Fairlife at night, and her cravings are satisfied.    Assessment/Plan:   Meds ordered this encounter  Medications  . Vitamin D, Ergocalciferol, (DRISDOL) 1.25 MG (50000 UNIT) CAPS capsule    Sig: Take 1 capsule (50,000 Units total) by mouth every 7 (seven) days.    Dispense:  4 capsule    Refill:  0   No orders of the defined types were placed in this encounter.    1. Vitamin D deficiency [name]'s Vitamin D level was 65.1 on 09/02/2020. She is currently taking prescription vitamin D 50,000 IU each week. She denies nausea, vomiting or muscle weakness.  -Refill Vitamin D, Ergocalciferol, (DRISDOL) 1.25 MG (50000 UNIT) CAPS capsule; Take 1 capsule (50,000 Units total) by mouth every 7 (seven) days.  Dispense: 4 capsule; Refill: 0    2. Class 3 severe obesity with serious comorbidity and body mass index (BMI) of 40.0 to 44.9 in adult, unspecified obesity type ([place])  [name] is currently in the action stage of change. As such, her goal is to continue with weight loss efforts. She has agreed to the Category 2 Plan.   Exercise goals: Increase as tolerated to walking for 20 minutes daily  (up from 10 minutes).  Behavioral modification strategies: holiday eating strategies , celebration eating strategies and planning for success.  [name] has agreed to follow-up with our clinic in 3 weeks. She was informed of the importance of frequent follow-up visits to maximize her success with intensive lifestyle modifications for her multiple health conditions.     Objective:   Blood pressure 129/77, pulse 70, temperature 98.5 F (36.9 C), height 5\' 5"  (1.651 m), weight 244 lb (110.7 kg), SpO2 97 %. Body mass index is 40.6 kg/m.  General: Cooperative, alert, well developed, in no acute distress. HEENT: Conjunctivae and lids unremarkable. Cardiovascular: Regular rhythm.  Lungs: Normal work of breathing. Neurologic: No focal deficits.   Lab Results  Component Value Date   CREATININE 0.91 09/02/2020   BUN 13 09/02/2020   NA 141 09/02/2020   K 4.5 09/02/2020   CL 104 09/02/2020   CO2 26 09/02/2020   Lab Results  Component Value Date   ALT 11 09/02/2020   AST 17 09/02/2020   ALKPHOS 82 09/02/2020   BILITOT 0.8 09/02/2020   Lab Results  Component Value Date   HGBA1C 5.4 09/02/2020   HGBA1C 5.3 04/17/2018   Lab Results  Component Value Date   INSULIN 30.5 (H) 09/02/2020   Lab Results  Component Value Date   TSH 0.963 09/02/2020   Lab Results  Component Value Date   CHOL 199 09/02/2020  HDL 45 09/02/2020   LDLCALC 119 (H) 09/02/2020   TRIG 202 (H) 09/02/2020   CHOLHDL 4.4 09/02/2020   Lab Results  Component Value Date   WBC 8.5 09/02/2020   HGB 17.1 (H) 09/02/2020   HCT 51.3 (H) 09/02/2020   MCV 87 09/02/2020   PLT 202 09/02/2020     Obesity Behavioral Intervention:   Approximately 15 minutes were spent on the discussion below.  ASK: We discussed the diagnosis of obesity with [name] today and [name] agreed to give [place] permission to discuss obesity behavioral modification therapy today.  ASSESS: [name] has the diagnosis of obesity and her BMI  today is 40.7. [name] is in the action stage of change.   ADVISE: [name] was educated on the multiple health risks of obesity as well as the benefit of weight loss to improve her health. She was advised of the need for long term treatment and the importance of lifestyle modifications to improve her current health and to decrease her risk of future health problems.  AGREE: Multiple dietary modification options and treatment options were discussed and [name] agreed to follow the recommendations documented in the above note.  ARRANGE: [name] was educated on the importance of frequent visits to treat obesity as outlined per CMS and USPSTF guidelines and agreed to schedule her next follow up appointment today.  Attestation Statements:   Reviewed by clinician on day of visit: allergies, medications, problem list, medical history, surgical history, family history, social history, and previous encounter notes.  I, Water quality scientist, CMA, am acting as Location manager for [employer], DO.  I have reviewed the above documentation for accuracy and completeness, and I agree with the above. Marjory Sneddon, D.O.  The [place] was signed into law in 2016 which includes the topic of electronic health records.  This provides immediate access to information in MyChart.  This includes consultation notes, operative notes, office notes, lab results and pathology reports.  If you have any questions about what you read please let us know at your next visit so we can discuss your concerns and take corrective action if need be.  We are right here with you.

## 2020-11-02 ENCOUNTER — Ambulatory Visit (INDEPENDENT_AMBULATORY_CARE_PROVIDER_SITE_OTHER): Payer: Medicare Other | Admitting: Family Medicine

## 2020-11-02 ENCOUNTER — Encounter (INDEPENDENT_AMBULATORY_CARE_PROVIDER_SITE_OTHER): Payer: Self-pay | Admitting: Family Medicine

## 2020-11-02 ENCOUNTER — Other Ambulatory Visit: Payer: Self-pay

## 2020-11-02 VITALS — BP 132/70 | HR 69 | Temp 98.3°F | Ht 65.0 in | Wt 247.0 lb

## 2020-11-02 DIAGNOSIS — I10 Essential (primary) hypertension: Secondary | ICD-10-CM | POA: Diagnosis not present

## 2020-11-02 DIAGNOSIS — E559 Vitamin D deficiency, unspecified: Secondary | ICD-10-CM

## 2020-11-02 DIAGNOSIS — F430 Acute stress reaction: Secondary | ICD-10-CM

## 2020-11-02 DIAGNOSIS — E66813 Obesity, class 3: Secondary | ICD-10-CM

## 2020-11-02 DIAGNOSIS — Z6841 Body Mass Index (BMI) 40.0 and over, adult: Secondary | ICD-10-CM

## 2020-11-08 NOTE — Progress Notes (Signed)
Chief Complaint:   OBESITY [name] is here to discuss her progress with her obesity treatment plan along with follow-up of her obesity related diagnoses. [name] is on the Category 2 Plan and states she is following her eating plan approximately 75% of the time. [name] states she is exercising 0 minutes 0 times per week.  Today's visit was #: 5 Starting weight: 256 lbs Starting date: 09/02/2020 Today's weight: 247 lbs Today's date: 11/02/2020 Total lbs lost to date: 9 lbs Total lbs lost since last in-office visit: +3 lbs Total weight loss percentage to date: -3.52%  Interim History: [name] lost a close family member and the wake/service was this past Saturday. She ate a lot off plan. She notes increased stress and not sleeping that well either.  Assessment/Plan:   1. Acute stress reaction [name] is on prescription Zoloft. She is not sleeping well and experiencing increased feelings of loss and is worried about her own longevity, as well as her husbands.  Plan: [name] denies need for a change in medication or dose. She denies suicidal ideations. We discussed additional stress management strategies to help her with her worries at this time.  2. Vitamin D deficiency [name] just had bone density scan and mammogram earlier this year. She takes prescription Vit D and is tolerating it well without side effects.  Plan: [name] will continue Vit D. Continue routine monitoring. Recheck every 3-4 months.  3. Essential hypertension [name] is on prescription Coreg. She is not checking her blood pressure at home, but does have upper arm monitor as well.  Plan: A repeat blood pressure today is within normal limits. [name] will check her blood pressure at home to keep a log. Continue medication per other doctors. Get back on the plan and decrease salt intake.  4. Class 3 severe obesity with serious comorbidity and body mass index (BMI) of 40.0 to 44.9 in adult, unspecified obesity type  ([name]) [name] is currently in the action stage of change. As such, her goal is to continue with weight loss efforts. She has agreed to the Category 2 Plan.   Exercise goals: 10 minutes a day of walking. It is okay to use Youtube "Walk at home with [employer] modification strategies: emotional eating strategies, holiday eating strategies  and celebration eating strategies.  [name] has agreed to follow-up with our clinic in 2 weeks. She was informed of the importance of frequent follow-up visits to maximize her success with intensive lifestyle modifications for her multiple health conditions.   Objective:   Blood pressure 132/70, pulse 69, temperature 98.3 F (36.8 C), height 5\' 5"  (1.651 m), weight 247 lb (112 kg), SpO2 96 %. Body mass index is 41.1 kg/m.  General: Cooperative, alert, well developed, in no acute distress. HEENT: Conjunctivae and lids unremarkable. Cardiovascular: Regular rhythm.  Lungs: Normal work of breathing. Neurologic: No focal deficits.   Lab Results  Component Value Date   CREATININE 0.91 09/02/2020   BUN 13 09/02/2020   NA 141 09/02/2020   K 4.5 09/02/2020   CL 104 09/02/2020   CO2 26 09/02/2020   Lab Results  Component Value Date   ALT 11 09/02/2020   AST 17 09/02/2020   ALKPHOS 82 09/02/2020   BILITOT 0.8 09/02/2020   Lab Results  Component Value Date   HGBA1C 5.4 09/02/2020   HGBA1C 5.3 04/17/2018   Lab Results  Component Value Date   INSULIN 30.5 (H) 09/02/2020   Lab Results  Component Value Date  TSH 0.963 09/02/2020   Lab Results  Component Value Date   CHOL 199 09/02/2020   HDL 45 09/02/2020   LDLCALC 119 (H) 09/02/2020   TRIG 202 (H) 09/02/2020   CHOLHDL 4.4 09/02/2020   Lab Results  Component Value Date   WBC 8.5 09/02/2020   HGB 17.1 (H) 09/02/2020   HCT 51.3 (H) 09/02/2020   MCV 87 09/02/2020   PLT 202 09/02/2020   Obesity Behavioral Intervention:   Approximately 15 minutes were spent on the discussion  below.  ASK: We discussed the diagnosis of obesity with [name] today and [name] agreed to give [place] permission to discuss obesity behavioral modification therapy today.  ASSESS: [name] has the diagnosis of obesity and her BMI today is 41.1. [name] is in the action stage of change.   ADVISE: [name] was educated on the multiple health risks of obesity as well as the benefit of weight loss to improve her health. She was advised of the need for long term treatment and the importance of lifestyle modifications to improve her current health and to decrease her risk of future health problems.  AGREE: Multiple dietary modification options and treatment options were discussed and [name] agreed to follow the recommendations documented in the above note.  ARRANGE: [name] was educated on the importance of frequent visits to treat obesity as outlined per CMS and USPSTF guidelines and agreed to schedule her next follow up appointment today.  Attestation Statements:   Reviewed by clinician on day of visit: allergies, medications, problem list, medical history, surgical history, family history, social history, and previous encounter notes.  Coral Ceo, am acting as Location manager for [employer], DO.  I have reviewed the above documentation for accuracy and completeness, and I agree with the above. Marjory Sneddon, D.O.  The [place] was signed into law in 2016 which includes the topic of electronic health records.  This provides immediate access to information in MyChart.  This includes consultation notes, operative notes, office notes, lab results and pathology reports.  If you have any questions about what you read please let us know at your next visit so we can discuss your concerns and take corrective action if need be.  We are right here with you.

## 2020-11-23 ENCOUNTER — Encounter (INDEPENDENT_AMBULATORY_CARE_PROVIDER_SITE_OTHER): Payer: Self-pay | Admitting: Family Medicine

## 2020-11-23 ENCOUNTER — Telehealth (INDEPENDENT_AMBULATORY_CARE_PROVIDER_SITE_OTHER): Payer: Medicare Other | Admitting: Family Medicine

## 2020-11-23 ENCOUNTER — Telehealth (INDEPENDENT_AMBULATORY_CARE_PROVIDER_SITE_OTHER): Payer: Self-pay

## 2020-11-23 ENCOUNTER — Other Ambulatory Visit: Payer: Self-pay

## 2020-11-23 VITALS — Temp 98.1°F | Ht 65.0 in | Wt 244.0 lb

## 2020-11-23 DIAGNOSIS — Z6841 Body Mass Index (BMI) 40.0 and over, adult: Secondary | ICD-10-CM | POA: Diagnosis not present

## 2020-11-23 DIAGNOSIS — E559 Vitamin D deficiency, unspecified: Secondary | ICD-10-CM

## 2020-11-23 DIAGNOSIS — Z20822 Contact with and (suspected) exposure to covid-19: Secondary | ICD-10-CM

## 2020-11-23 MED ORDER — VITAMIN D (ERGOCALCIFEROL) 1.25 MG (50000 UNIT) PO CAPS: 50000.0000 [IU] | ORAL_CAPSULE | ORAL | 0 refills | Status: DC

## 2020-11-23 NOTE — Progress Notes (Signed)
TeleHealth Visit:  Due to the COVID-19 pandemic, this visit was completed with telemedicine (audio/video) technology to reduce patient and provider exposure as well as to preserve personal protective equipment.   [name] has verbally consented to this TeleHealth visit. The patient is located at home, the provider is located at the [facility] office. The participants in this visit include the listed provider and patient. The visit was conducted today via video.  Chief Complaint: OBESITY [name] is here to discuss her progress with her obesity treatment plan along with follow-up of her obesity related diagnoses. [name] is on the Category 2 Plan and states she is following her eating plan approximately 95% of the time. [name] states she is walking 10 minutes 5 times per week.  Today's visit was #: 6 Starting weight: 256 lbs Starting date: 09/02/2020  Interim History: [name] was exposed to [name] again at an engagement for her granddaughter. Her grandson and daughter-in-law tested tested positive on Sunday. [name] started having symptoms 2-3 days ago. She is not vaccinated and doesn't trust it. She has a cough now but feels tired as well. She is not eating well and has not seen her PCP for a test yet.  Assessment/Plan:   1. Exposure to COVID-19 virus [name] is not vaccinated and doesn't think she needs it be; "doesn't trust it."  Plan: I recommend [name] get PCP testing ASAP (3-5 days after exposure to infected person and quarantine until negative test results), then I recommend vaccination. Counseling done regarding disease and vaccine, etc.  2. Vitamin D deficiency [name] Vitamin D level was 65.1 on 09/02/2020. She is currently taking prescription vitamin D 50,000 IU each week. She denies nausea, vomiting or muscle weakness.   Ref. Range 09/02/2020 09:28  Vitamin D, 25-Hydroxy Latest Ref Range: 30.0 - 100.0 ng/mL 65.1   Plan: Refill Vit D for 1 month, as per  below. - Reiterated importance of vitamin D (as well as calcium) to their health and wellbeing.  - Reminded [name] that weight loss will likely improve availability of vitamin D, thus encouraged her to continue with meal plan and their weight loss efforts to further improve this condition. - I recommend patient continue to take weekly prescription vit D 50,000 IU - Informed patient this may be a lifelong thing, and she was encouraged to continue to take the medicine until told otherwise.   - we will need to monitor levels regularly (every 3-4 mo on average) to keep levels within normal limits.  - weight loss will likely improve availability of vitamin D, thus encouraged Cyntha to continue with meal plan and their weight loss efforts to further improve this condition - pt's questions and concerns regarding this condition addressed.  Refill- Vitamin D, Ergocalciferol, (DRISDOL) 1.25 MG (50000 UNIT) CAPS capsule; Take 1 capsule (50,000 Units total) by mouth every 7 (seven) days.  Dispense: 4 capsule; Refill: 0  3. Class 3 severe obesity with serious comorbidity and body mass index (BMI) of 40.0 to 44.9 in adult, unspecified obesity type ([name]) [name] is currently in the action stage of change. As such, her goal is to continue with weight loss efforts. She has agreed to the Category 2 Plan.   Exercise goals: As is  Behavioral modification strategies: increasing lean protein intake, no skipping meals, holiday eating strategies , celebration eating strategies and planning for success.  [name] has agreed to follow-up with our clinic in 3 weeks. She was informed of the importance of frequent  follow-up visits to maximize her success with intensive lifestyle modifications for her multiple health conditions.  Objective:   VITALS: Per patient if applicable, see vitals. GENERAL: Alert and in no acute distress. CARDIOPULMONARY: No increased WOB. Speaking in clear sentences.  PSYCH: Pleasant  and cooperative. Speech normal rate and rhythm. Affect is appropriate. Insight and judgement are appropriate. Attention is focused, linear, and appropriate.  NEURO: Oriented as arrived to appointment on time with no prompting.   Lab Results  Component Value Date   CREATININE 0.91 09/02/2020   BUN 13 09/02/2020   NA 141 09/02/2020   K 4.5 09/02/2020   CL 104 09/02/2020   CO2 26 09/02/2020   Lab Results  Component Value Date   ALT 11 09/02/2020   AST 17 09/02/2020   ALKPHOS 82 09/02/2020   BILITOT 0.8 09/02/2020   Lab Results  Component Value Date   HGBA1C 5.4 09/02/2020   HGBA1C 5.3 04/17/2018   Lab Results  Component Value Date   INSULIN 30.5 (H) 09/02/2020   Lab Results  Component Value Date   TSH 0.963 09/02/2020   Lab Results  Component Value Date   CHOL 199 09/02/2020   HDL 45 09/02/2020   LDLCALC 119 (H) 09/02/2020   TRIG 202 (H) 09/02/2020   CHOLHDL 4.4 09/02/2020   Lab Results  Component Value Date   WBC 8.5 09/02/2020   HGB 17.1 (H) 09/02/2020   HCT 51.3 (H) 09/02/2020   MCV 87 09/02/2020   PLT 202 09/02/2020    Attestation Statements:   Reviewed by clinician on day of visit: allergies, medications, problem list, medical history, surgical history, family history, social history, and previous encounter notes.  Coral Ceo, am acting as Location manager for [employer], DO.  I have reviewed the above documentation for accuracy and completeness, and I agree with the above. [name], D.O.  The [place] was signed into law in 2016 which includes the topic of electronic health records.  This provides immediate access to information in MyChart.  This includes consultation notes, operative notes, office notes, lab results and pathology reports.  If you have any questions about what you read please let us know at your next visit so we can discuss your concerns and take corrective action if need be.  We are right here with  you.

## 2020-11-23 NOTE — Telephone Encounter (Signed)
Verbal consent obtained to conduct video visit, via telehealth 

## 2020-11-25 ENCOUNTER — Other Ambulatory Visit: Payer: Self-pay | Admitting: Oncology

## 2020-11-25 ENCOUNTER — Telehealth: Payer: Self-pay | Admitting: Oncology

## 2020-11-25 ENCOUNTER — Encounter: Payer: Self-pay | Admitting: Oncology

## 2020-11-25 DIAGNOSIS — U071 COVID-19: Secondary | ICD-10-CM

## 2020-11-25 NOTE — Telephone Encounter (Signed)
I connected by phone with [name]  to discuss the potential use of an new treatment for mild to moderate COVID-19 viral infection in non-hospitalized patients.   This patient is a age/sex that meets the FDA criteria for Emergency Use Authorization of casirivimab\imdevimab.  Has a (+) direct SARS-CoV-2 viral test result 1. Has mild or moderate COVID-19  2. Is ? 74 years of age and weighs ? 40 kg 3. Is NOT hospitalized due to COVID-19 4. Is NOT requiring oxygen therapy or requiring an increase in baseline oxygen flow rate due to COVID-19 5. Is within 10 days of symptom onset 6. Has at least one of the high risk factor(s) for progression to severe COVID-19 and/or hospitalization as defined in EUA. Specific high risk criteria : Past Medical History:  Diagnosis Date  . Anxiety   . Back pain   . Depression   . Diplopia   . GERD (gastroesophageal reflux disease)   . HTN (hypertension)   . Hyperlipidemia   . Hypertension   . Normal cardiac stress test 2011   echo stress with normal LV function., Normal 2-D echo 2008  . Obesity   . Shortness of breath on exertion   . 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 Swelling   . Vitamin D deficiency   ?  ?    Symptom onset  11/20/20   I have spoken and communicated the following to the patient or parent/caregiver:   1. FDA has authorized the emergency use of casirivimab\imdevimab for the treatment of mild to moderate COVID-19 in adults and pediatric patients with positive results of direct SARS-CoV-2 viral testing who are 32 years of age and older weighing at least 40 kg, and who are at high risk for progressing to severe COVID-19 and/or hospitalization.   2. The significant known and potential risks and benefits of casirivimab\imdevimab, and the extent to which such potential risks and benefits are unknown.   3. Information on available alternative treatments and the risks and benefits of those alternatives, including clinical trials.    4. Patients treated with casirivimab\imdevimab should continue to self-isolate and use infection control measures (e.g., wear mask, isolate, social distance, avoid sharing personal items, clean and disinfect "high touch" surfaces, and frequent handwashing) according to CDC guidelines.    5. The patient or parent/caregiver has the option to accept or refuse casirivimab\imdevimab .   After reviewing this information with the patient, The patient agreed to proceed with receiving casirivimab\imdevimab infusion and will be provided a copy of the Fact sheet prior to receiving the infusion.Rulon Abide, AGNP-C [phone] ([place])

## 2020-11-26 ENCOUNTER — Ambulatory Visit (HOSPITAL_COMMUNITY)
Admission: RE | Admit: 2020-11-26 | Discharge: 2020-11-26 | Disposition: A | Discharge status: Home / Self Care | Payer: Medicare Other | Source: Ambulatory Visit | Attending: Pulmonary Disease | Admitting: Pulmonary Disease

## 2020-11-26 DIAGNOSIS — Z23 Encounter for immunization: Secondary | ICD-10-CM | POA: Insufficient documentation

## 2020-11-26 DIAGNOSIS — U071 COVID-19: Secondary | ICD-10-CM

## 2020-11-26 MED ORDER — EPINEPHRINE 0.3 MG/0.3ML IJ SOAJ: 0.3000 mg | Freq: Once | INTRAMUSCULAR | Status: DC | PRN

## 2020-11-26 MED ORDER — ALBUTEROL SULFATE HFA 108 (90 BASE) MCG/ACT IN AERS: 2.0000 | INHALATION_SPRAY | Freq: Once | RESPIRATORY_TRACT | Status: DC | PRN

## 2020-11-26 MED ORDER — METHYLPREDNISOLONE SODIUM SUCC 125 MG IJ SOLR: 125.0000 mg | Freq: Once | INTRAMUSCULAR | Status: DC | PRN

## 2020-11-26 MED ORDER — DIPHENHYDRAMINE HCL 50 MG/ML IJ SOLN: 50.0000 mg | Freq: Once | INTRAMUSCULAR | Status: DC | PRN

## 2020-11-26 MED ORDER — SODIUM CHLORIDE 0.9 % IV SOLN: INTRAVENOUS | Status: DC | PRN

## 2020-11-26 MED ORDER — FAMOTIDINE IN NACL 20-0.9 MG/50ML-% IV SOLN: 20.0000 mg | Freq: Once | INTRAVENOUS | Status: DC | PRN

## 2020-11-26 MED ORDER — SODIUM CHLORIDE 0.9 % IV SOLN: Freq: Once | INTRAVENOUS | Status: AC

## 2020-11-26 NOTE — Discharge Instructions (Signed)
10 Things You Can Do to Manage Your COVID-19 Symptoms at Home If you have possible or confirmed COVID-19: 1. Stay home from work and school. And stay away from other public places. If you must go out, avoid using any kind of public transportation, ridesharing, or taxis. 2. Monitor your symptoms carefully. If your symptoms get worse, call your healthcare provider immediately. 3. Get rest and stay hydrated. 4. If you have a medical appointment, call the healthcare provider ahead of time and tell them that you have or may have COVID-19. 5. For medical emergencies, call 911 and notify the dispatch personnel that you have or may have COVID-19. 6. Cover your cough and sneezes with a tissue or use the inside of your elbow. 7. Wash your hands often with soap and water for at least 20 seconds or clean your hands with an alcohol-based hand sanitizer that contains at least 60% alcohol. 8. As much as possible, stay in a specific room and away from other people in your home. Also, you should use a separate bathroom, if available. If you need to be around other people in or outside of the home, wear a mask. 9. Avoid sharing personal items with other people in your household, like dishes, towels, and bedding. 10. Clean all surfaces that are touched often, like counters, tabletops, and doorknobs. Use household cleaning sprays or wipes according to the label instructions. cdc.gov/coronavirus 06/11/2019 This information is not intended to replace advice given to you by your health care provider. Make sure you discuss any questions you have with your health care provider. Document Revised: 11/13/2019 Document Reviewed: 11/13/2019 Elsevier Patient Education  2020 Elsevier Inc. What types of side effects do monoclonal antibody drugs cause?  Common side effects  In general, the more common side effects caused by monoclonal antibody drugs include: . Allergic reactions, such as hives or itching . Flu-like signs and  symptoms, including chills, fatigue, fever, and muscle aches and pains . Nausea, vomiting . Diarrhea . Skin rashes . Low blood pressure   The CDC is recommending patients who receive monoclonal antibody treatments wait at least 90 days before being vaccinated.  Currently, there are no data on the safety and efficacy of mRNA COVID-19 vaccines in persons who received monoclonal antibodies or convalescent plasma as part of COVID-19 treatment. Based on the estimated half-life of such therapies as well as evidence suggesting that reinfection is uncommon in the 90 days after initial infection, vaccination should be deferred for at least 90 days, as a precautionary measure until additional information becomes available, to avoid interference of the antibody treatment with vaccine-induced immune responses. If you have any questions or concerns after the infusion please call the Advanced Practice Provider on call at [phone]. This number is ONLY intended for your use regarding questions or concerns about the infusion post-treatment side-effects.  Please do not provide this number to others for use. For return to work notes please contact your primary care provider.   If someone you know is interested in receiving treatment please have them call the COVID hotline at [phone].   

## 2020-11-26 NOTE — Progress Notes (Signed)
  Diagnosis: COVID-19  Physician: Dr. Wright   Procedure: [facility] Med: bamlanivimab\etesevimab infusion - Provided patient with bamlanimivab\etesevimab fact sheet for patients, parents and caregivers prior to infusion.  Complications: No immediate complications noted.  Discharge: Discharged home   Sahithi Ordoyne  B Vaudine Dutan 11/26/2020   

## 2020-11-26 NOTE — Progress Notes (Signed)
Patient reviewed Fact Sheet for Patients, Parents, and Caregivers for Emergency Use Authorization (EUA) of bamlanivimab and etesevimab for the Treatment of Coronavirus. Patient also reviewed and is agreeable to the estimated cost of treatment. Patient is agreeable to proceed.   

## 2020-12-07 DIAGNOSIS — Z20822 Contact with and (suspected) exposure to covid-19: Secondary | ICD-10-CM | POA: Diagnosis not present

## 2020-12-07 DIAGNOSIS — Z03818 Encounter for observation for suspected exposure to other biological agents ruled out: Secondary | ICD-10-CM | POA: Diagnosis not present

## 2020-12-13 ENCOUNTER — Telehealth (INDEPENDENT_AMBULATORY_CARE_PROVIDER_SITE_OTHER): Payer: Medicare Other | Admitting: Family Medicine

## 2020-12-13 ENCOUNTER — Encounter (INDEPENDENT_AMBULATORY_CARE_PROVIDER_SITE_OTHER): Payer: Self-pay

## 2020-12-13 ENCOUNTER — Encounter (INDEPENDENT_AMBULATORY_CARE_PROVIDER_SITE_OTHER): Payer: Self-pay | Admitting: Family Medicine

## 2020-12-13 ENCOUNTER — Other Ambulatory Visit: Payer: Self-pay

## 2020-12-13 ENCOUNTER — Telehealth (INDEPENDENT_AMBULATORY_CARE_PROVIDER_SITE_OTHER): Payer: Self-pay

## 2020-12-13 VITALS — Ht 65.0 in

## 2020-12-13 DIAGNOSIS — Z6841 Body Mass Index (BMI) 40.0 and over, adult: Secondary | ICD-10-CM

## 2020-12-13 DIAGNOSIS — R7303 Prediabetes: Secondary | ICD-10-CM

## 2020-12-13 DIAGNOSIS — E559 Vitamin D deficiency, unspecified: Secondary | ICD-10-CM | POA: Diagnosis not present

## 2020-12-13 DIAGNOSIS — E7849 Other hyperlipidemia: Secondary | ICD-10-CM

## 2020-12-13 MED ORDER — VITAMIN D (ERGOCALCIFEROL) 1.25 MG (50000 UNIT) PO CAPS: 50000.0000 [IU] | ORAL_CAPSULE | ORAL | 0 refills | Status: DC

## 2020-12-13 NOTE — Telephone Encounter (Signed)
Verbal consent obtained to conduct video visit, via telehealth 

## 2020-12-14 NOTE — Progress Notes (Signed)
TeleHealth Visit:  Due to the COVID-19 pandemic, this visit was completed with telemedicine (audio/video) technology to reduce patient and provider exposure as well as to preserve personal protective equipment.   [name] has verbally consented to this TeleHealth visit. The patient is located at home, the provider is located at the [facility] office. The participants in this visit include the listed provider and patient.   A live video visit was scheduled and initiated for today's office visit.  However the patient was disconnected at one point and unable to reconnect via computer for just the video portion due to technical difficulties.  We proceeded with the office visit despite these technical complications.   Chief Complaint: OBESITY [name] is here to discuss her progress with her obesity treatment plan along with follow-up of her obesity related diagnoses. [name] is on the Category 2 Plan and states she is following her eating plan approximately 30% of the time. [name] states she is exercising 0 minutes 0 times per week.  Today's visit was #: 7 Starting weight: 256 lbs Starting date: 09/02/2020  Interim History: [name] tested positive for COVID on 11/24/2020, one day after her OV with [place].  Her "appetite is still not right" and "taste buds are still off".  She is still unable to smell like she used to. Overall, [name] states she is much better with her covid illness.  She hasn't really stuck to the plan and notes that she can tolerate Cook[employer] Cherry shakes ok and the like, but it's difficult to eat healthy at this time.  Assessment/Plan:   1. Pre-diabetes [name] has a diagnosis of prediabetes based on her elevated HgA1c and was informed this puts her at greater risk of developing diabetes. She continues to work on diet and exercise to decrease her risk of diabetes. She denies nausea or hypoglycemia.  Lab Results  Component Value Date   HGBA1C 5.4 09/02/2020    Lab Results  Component Value Date   INSULIN 30.5 (H) 09/02/2020   Plan: Consider rechecking A1c and fasting insulin in the near future. Consider GLP-1 in future if patient is amenable to it. [name] will continue to work on weight loss, exercise, and decreasing simple carbohydrates to help decrease the risk of diabetes.    2. Other hyperlipidemia [name] has hyperlipidemia. She has declined medication in the past and wanted to work on diet and weight loss.  Lab Results  Component Value Date   ALT 11 09/02/2020   AST 17 09/02/2020   ALKPHOS 82 09/02/2020   BILITOT 0.8 09/02/2020   Lab Results  Component Value Date   CHOL 199 09/02/2020   HDL 45 09/02/2020   LDLCALC 119 (H) 09/02/2020   TRIG 202 (H) 09/02/2020   CHOLHDL 4.4 09/02/2020   Plan: Consider rechecking fasting lipid panel in the near future to how well lifestyle changes have improved levels. Continue prudent nutritional plan, weight loss, and exercise.  3. Vitamin D deficiency [name]'s Vitamin D level was 65.1 on 09/02/2020. She is currently taking prescription vitamin D 50,000 IU each week. She denies nausea, vomiting or muscle weakness.  Ref. Range 09/02/2020 09:28  Vitamin D, 25-Hydroxy Latest Ref Range: 30.0 - 100.0 ng/mL 65.1   Plan: Refill Vit D for 1 month, as per below.  - Reiterated importance of vitamin D (as well as calcium) to their health and wellbeing.  - Reminded [name] that weight loss will likely improve availability of vitamin D, thus encouraged her to continue  with meal plan and their weight loss efforts to further improve this condition. - I recommend patient continue to take weekly prescription vit D 50,000 IU - Informed patient this may be a lifelong thing, and she was encouraged to continue to take the medicine until told otherwise.   - we will need to monitor levels regularly (every 3-4 mo on average) to keep levels within normal limits.  - weight loss will likely improve availability  of vitamin D, thus encouraged [name] to continue with meal plan and their weight loss efforts to further improve this condition - pt's questions and concerns regarding this condition addressed.  Refill- Vitamin D, Ergocalciferol, (DRISDOL) 1.25 MG (50000 UNIT) CAPS capsule; Take 1 capsule (50,000 Units total) by mouth every 7 (seven) days.  Dispense: 4 capsule; Refill: 0  4. Class 3 severe obesity with serious comorbidity and body mass index (BMI) of 40.0 to 44.9 in adult, unspecified obesity type (HCC) [name] is currently in the action stage of change. As such, her goal is to continue with weight loss efforts. She has agreed to the Category 2 Plan.   Exercise goals: Older adults should follow the adult guidelines. When older adults cannot meet the adult guidelines, they should be as physically active as their abilities and conditions will allow.   Behavioral modification strategies: meal planning and cooking strategies, keeping healthy foods in the home and planning for success.  [name] has agreed to follow-up with our clinic in 2 weeks. She was informed of the importance of frequent follow-up visits to maximize her success with intensive lifestyle modifications for her multiple health conditions.  Objective:   VITALS: Per patient if applicable, see vitals. GENERAL: Alert and in no acute distress. CARDIOPULMONARY: No increased WOB. Speaking in clear sentences.  PSYCH: Pleasant and cooperative. Speech normal rate and rhythm. Affect is appropriate. Insight and judgement are appropriate. Attention is focused, linear, and appropriate.  NEURO: Oriented as arrived to appointment on time with no prompting.   Lab Results  Component Value Date   CREATININE 0.91 09/02/2020   BUN 13 09/02/2020   NA 141 09/02/2020   K 4.5 09/02/2020   CL 104 09/02/2020   CO2 26 09/02/2020   Lab Results  Component Value Date   ALT 11 09/02/2020   AST 17 09/02/2020   ALKPHOS 82 09/02/2020   BILITOT 0.8  09/02/2020   Lab Results  Component Value Date   HGBA1C 5.4 09/02/2020   HGBA1C 5.3 04/17/2018   Lab Results  Component Value Date   INSULIN 30.5 (H) 09/02/2020   Lab Results  Component Value Date   TSH 0.963 09/02/2020   Lab Results  Component Value Date   CHOL 199 09/02/2020   HDL 45 09/02/2020   LDLCALC 119 (H) 09/02/2020   TRIG 202 (H) 09/02/2020   CHOLHDL 4.4 09/02/2020   Lab Results  Component Value Date   WBC 8.5 09/02/2020   HGB 17.1 (H) 09/02/2020   HCT 51.3 (H) 09/02/2020   MCV 87 09/02/2020   PLT 202 09/02/2020   No results found for: IRON, TIBC, FERRITIN  Attestation Statements:   Reviewed by clinician on day of visit: allergies, medications, problem list, medical history, surgical history, family history, social history, and previous encounter notes.  Edmund Hilda, am acting as Energy manager for [employer], DO.  I have reviewed the above documentation for accuracy and completeness, and I agree with the above. Carlye Grippe, D.O.  The 21st Century Cures Act was  signed into law in 2016 which includes the topic of electronic health records.  This provides immediate access to information in MyChart.  This includes consultation notes, operative notes, office notes, lab results and pathology reports.  If you have any questions about what you read please let us know at your next visit so we can discuss your concerns and take corrective action if need be.  We are right here with you.

## 2020-12-27 ENCOUNTER — Telehealth (INDEPENDENT_AMBULATORY_CARE_PROVIDER_SITE_OTHER): Payer: Self-pay

## 2020-12-27 ENCOUNTER — Telehealth (INDEPENDENT_AMBULATORY_CARE_PROVIDER_SITE_OTHER): Payer: Medicare Other | Admitting: Family Medicine

## 2020-12-27 ENCOUNTER — Other Ambulatory Visit: Payer: Self-pay

## 2020-12-27 ENCOUNTER — Encounter (INDEPENDENT_AMBULATORY_CARE_PROVIDER_SITE_OTHER): Payer: Self-pay | Admitting: Family Medicine

## 2020-12-27 DIAGNOSIS — E559 Vitamin D deficiency, unspecified: Secondary | ICD-10-CM | POA: Diagnosis not present

## 2020-12-27 DIAGNOSIS — E7849 Other hyperlipidemia: Secondary | ICD-10-CM | POA: Diagnosis not present

## 2020-12-27 DIAGNOSIS — K5909 Other constipation: Secondary | ICD-10-CM | POA: Diagnosis not present

## 2020-12-27 DIAGNOSIS — Z6841 Body Mass Index (BMI) 40.0 and over, adult: Secondary | ICD-10-CM | POA: Diagnosis not present

## 2020-12-27 DIAGNOSIS — I10 Essential (primary) hypertension: Secondary | ICD-10-CM

## 2020-12-27 DIAGNOSIS — R7303 Prediabetes: Secondary | ICD-10-CM | POA: Diagnosis not present

## 2020-12-27 MED ORDER — POLYETHYLENE GLYCOL 3350 17 G PO PACK: 17.0000 g | PACK | Freq: Two times a day (BID) | ORAL | 0 refills | Status: AC

## 2020-12-27 MED ORDER — SENNOSIDES-DOCUSATE SODIUM 8.6-50 MG PO TABS: 2.0000 | ORAL_TABLET | Freq: Two times a day (BID) | ORAL | 0 refills | Status: AC

## 2020-12-27 NOTE — Telephone Encounter (Signed)
Verbal consent obtained to conduct video visit, via telehealth 

## 2020-12-28 NOTE — Progress Notes (Signed)
TeleHealth Visit:  Due to the COVID-19 pandemic, this visit was completed with telemedicine (audio/video) technology to reduce patient and provider exposure as well as to preserve personal protective equipment.   [name] has verbally consented to this TeleHealth visit. The patient is located at home, the provider is located at the [facility] office. The participants in this visit include the listed provider and patient. The visit was conducted today via video.  Chief Complaint: OBESITY [name] is here to discuss her progress with her obesity treatment plan along with follow-up of her obesity related diagnoses. [name] is on the Category 2 Plan and states she is following her eating plan approximately 50% of the time. [name] states she is exercising 0 minutes 0 times per week.  Today's visit was #: 8 Starting weight: 256 lbs Starting date: 09/02/2020  Interim History: This is [name]'s 2nd virtual visit in a row. She doesn't think she lost anything; maybe gained. Patient is having a hard time staying on the plan since COVID last month and she has been eating things that are "off plan" to help with bowel movements/constipation.  Assessment/Plan:   1. Pre-diabetes [name] is not taking any medication and has declined them in recent past.  Lab Results  Component Value Date   HGBA1C 5.4 09/02/2020   Lab Results  Component Value Date   INSULIN 30.5 (H) 09/02/2020   Plan: Consider rechecking labs at next OV. Patient advised to come in fasting. Increase exercise, prudent nutritional plan, and weight loss recommended.  Lab/Orders today or future: - Hemoglobin A1c - Insulin, random  2. Other hyperlipidemia [name] is not taking any medication, as she has declined several times in the past.  Lab Results  Component Value Date   ALT 11 09/02/2020   AST 17 09/02/2020   ALKPHOS 82 09/02/2020   BILITOT 0.8 09/02/2020   Lab Results  Component Value Date   CHOL 199  09/02/2020   HDL 45 09/02/2020   LDLCALC 119 (H) 09/02/2020   TRIG 202 (H) 09/02/2020   CHOLHDL 4.4 09/02/2020   Plan: Check labs at next OV. Prudent nutritional plan and weight loss, along with meeting AHA guidelines for activity.  Lab/Orders today or future: - Lipid panel  3. Vitamin D deficiency [name]'s Vitamin D level was 65.1 on 09/02/2020. She is currently taking prescription vitamin D 50,000 IU each week. She denies nausea, vomiting or muscle weakness.  Ref. Range 09/02/2020 09:28  Vitamin D, 25-Hydroxy Latest Ref Range: 30.0 - 100.0 ng/mL 65.1   Plan:  Patient denies need for refill. She is tolerating medication well. Check labs at next OV. Low Vitamin D level contributes to fatigue and are associated with obesity, breast, and colon cancer. She agrees to continue to take prescription Vitamin D @50 ,000 IU every week and will follow-up for routine testing of Vitamin D, at least 2-3 times per year to avoid over-replacement.  Lab/Orders today or future: - VITAMIN D 25 Hydroxy (Vit-D Deficiency, Fractures)  4. Other constipation New. Worsening. [name] had an acute episode of constipation well last week. This is not usual for her. She only drinks 1 bottle of water a day, 1 can of diet soda a day, and some coffee.   Plan: Start Miralax and BID and Senokot BID, as per below. Increase water intake and move more since she's been lying around more.  Start- senna-docusate (SENOKOT-S) 8.6-50 MG tablet; Take 2 tablets by mouth 2 (two) times daily. Until stooling regularly  Dispense: 60  tablet; Refill: 0 Start- polyethylene glycol (MIRALAX / GLYCOLAX) 17 g packet; Take 17 g by mouth 2 (two) times daily. Until stooling regularly  Dispense: 60 packet; Refill: 0  5. Essential hypertension [name]'s BP has been well controlled at home. She has no issues with Coreg. She is not checking her BP regularly at home but feels fine and reports no new symptoms or concerns.  Plan: Continue current  treatment plan of Coreg. Recommend home blood pressure monitoring more regularly, like 2 days a week. Prudent nutritional plan, low salt, increase water intake, and exercise.  6. Class 3 severe obesity with serious comorbidity and body mass index (BMI) of 40.0 to 44.9 in adult, unspecified obesity type ([place]) [name] is currently in the action stage of change. As such, her goal is to continue with weight loss efforts. She has agreed to the Category 2 Plan.   Exercise goals: Older adults should follow the adult guidelines. When older adults cannot meet the adult guidelines, they should be as physically active as their abilities and conditions will allow.   Behavioral modification strategies: meal planning and cooking strategies, keeping healthy foods in the home and planning for success.  [name] has agreed to follow-up with our clinic in 2 weeks on 01/10/2021 at 1120 (check labs). She was informed of the importance of frequent follow-up visits to maximize her success with intensive lifestyle modifications for her multiple health conditions.  Objective:   VITALS: Per patient if applicable, see vitals. GENERAL: Alert and in no acute distress. CARDIOPULMONARY: No increased WOB. Speaking in clear sentences.  PSYCH: Pleasant and cooperative. Speech normal rate and rhythm. Affect is appropriate. Insight and judgement are appropriate. Attention is focused, linear, and appropriate.  NEURO: Oriented as arrived to appointment on time with no prompting.   Lab Results  Component Value Date   CREATININE 0.91 09/02/2020   BUN 13 09/02/2020   NA 141 09/02/2020   K 4.5 09/02/2020   CL 104 09/02/2020   CO2 26 09/02/2020   Lab Results  Component Value Date   ALT 11 09/02/2020   AST 17 09/02/2020   ALKPHOS 82 09/02/2020   BILITOT 0.8 09/02/2020   Lab Results  Component Value Date   HGBA1C 5.4 09/02/2020   HGBA1C 5.3 04/17/2018   Lab Results  Component Value Date   INSULIN 30.5 (H) 09/02/2020    Lab Results  Component Value Date   TSH 0.963 09/02/2020   Lab Results  Component Value Date   CHOL 199 09/02/2020   HDL 45 09/02/2020   LDLCALC 119 (H) 09/02/2020   TRIG 202 (H) 09/02/2020   CHOLHDL 4.4 09/02/2020   Lab Results  Component Value Date   WBC 8.5 09/02/2020   HGB 17.1 (H) 09/02/2020   HCT 51.3 (H) 09/02/2020   MCV 87 09/02/2020   PLT 202 09/02/2020   No results found for: IRON, TIBC, FERRITIN  Attestation Statements:   Reviewed by clinician on day of visit: allergies, medications, problem list, medical history, surgical history, family history, social history, and previous encounter notes.  Coral Ceo, am acting as Location manager for [employer], DO.  I have reviewed the above documentation for accuracy and completeness, and I agree with the above. Marjory Sneddon, D.O.  The [place] was signed into law in 2016 which includes the topic of electronic health records.  This provides immediate access to information in MyChart.  This includes consultation notes, operative notes, office notes, lab results and  pathology reports.  If you have any questions about what you read please let us know at your next visit so we can discuss your concerns and take corrective action if need be.  We are right here with you.

## 2021-01-04 DIAGNOSIS — F418 Other specified anxiety disorders: Secondary | ICD-10-CM | POA: Diagnosis not present

## 2021-01-04 DIAGNOSIS — Z23 Encounter for immunization: Secondary | ICD-10-CM | POA: Diagnosis not present

## 2021-01-04 DIAGNOSIS — E559 Vitamin D deficiency, unspecified: Secondary | ICD-10-CM | POA: Diagnosis not present

## 2021-01-04 DIAGNOSIS — K219 Gastro-esophageal reflux disease without esophagitis: Secondary | ICD-10-CM | POA: Diagnosis not present

## 2021-01-04 DIAGNOSIS — N183 Chronic kidney disease, stage 3 unspecified: Secondary | ICD-10-CM | POA: Diagnosis not present

## 2021-01-04 DIAGNOSIS — Z1389 Encounter for screening for other disorder: Secondary | ICD-10-CM | POA: Diagnosis not present

## 2021-01-04 DIAGNOSIS — E7800 Pure hypercholesterolemia, unspecified: Secondary | ICD-10-CM | POA: Diagnosis not present

## 2021-01-04 DIAGNOSIS — I129 Hypertensive chronic kidney disease with stage 1 through stage 4 chronic kidney disease, or unspecified chronic kidney disease: Secondary | ICD-10-CM | POA: Diagnosis not present

## 2021-01-04 DIAGNOSIS — M1990 Unspecified osteoarthritis, unspecified site: Secondary | ICD-10-CM | POA: Diagnosis not present

## 2021-01-04 DIAGNOSIS — R7303 Prediabetes: Secondary | ICD-10-CM | POA: Diagnosis not present

## 2021-01-04 DIAGNOSIS — G720 Drug-induced myopathy: Secondary | ICD-10-CM | POA: Diagnosis not present

## 2021-01-04 DIAGNOSIS — Z0000 Encounter for general adult medical examination without abnormal findings: Secondary | ICD-10-CM | POA: Diagnosis not present

## 2021-01-10 ENCOUNTER — Ambulatory Visit (INDEPENDENT_AMBULATORY_CARE_PROVIDER_SITE_OTHER): Payer: Medicare Other | Admitting: Family Medicine

## 2021-01-10 ENCOUNTER — Encounter (INDEPENDENT_AMBULATORY_CARE_PROVIDER_SITE_OTHER): Payer: Self-pay | Admitting: Family Medicine

## 2021-01-10 ENCOUNTER — Other Ambulatory Visit: Payer: Self-pay

## 2021-01-10 VITALS — BP 140/82 | HR 69 | Temp 97.9°F | Ht 65.0 in | Wt 237.0 lb

## 2021-01-10 DIAGNOSIS — R7303 Prediabetes: Secondary | ICD-10-CM

## 2021-01-10 DIAGNOSIS — K5909 Other constipation: Secondary | ICD-10-CM

## 2021-01-10 DIAGNOSIS — E7849 Other hyperlipidemia: Secondary | ICD-10-CM | POA: Diagnosis not present

## 2021-01-10 DIAGNOSIS — Z6839 Body mass index (BMI) 39.0-39.9, adult: Secondary | ICD-10-CM

## 2021-01-10 DIAGNOSIS — E559 Vitamin D deficiency, unspecified: Secondary | ICD-10-CM | POA: Diagnosis not present

## 2021-01-10 MED ORDER — VITAMIN D (ERGOCALCIFEROL) 1.25 MG (50000 UNIT) PO CAPS: 50000.0000 [IU] | ORAL_CAPSULE | ORAL | 0 refills | Status: DC

## 2021-01-10 NOTE — Patient Instructions (Signed)
The 10-year ASCVD risk score Mikey Bussing DC Brooke Bonito., et al., 2013) is: 22.4%   Values used to calculate the score:     Age: 75 years     Sex: Female     Is Non-Hispanic African American: No     Diabetic: No     Tobacco smoker: No     Systolic Blood Pressure: 858 mmHg     Is BP treated: Yes     HDL Cholesterol: 45 mg/dL     Total Cholesterol: 199 mg/dL

## 2021-01-11 LAB — LIPID PANEL
Chol/HDL Ratio: 4.4 ratio (ref 0.0–4.4)
Cholesterol, Total: 182 mg/dL (ref 100–199)
HDL: 41 mg/dL (ref >39)
LDL Chol Calc (NIH): 112 mg/dL — ABNORMAL HIGH (ref 0–99)
Triglycerides: 167 mg/dL — ABNORMAL HIGH (ref 0–149)
VLDL Cholesterol Cal: 29 mg/dL (ref 5–40)

## 2021-01-11 LAB — INSULIN, RANDOM: INSULIN: 12 u[IU]/mL (ref 2.6–24.9)

## 2021-01-11 LAB — HEMOGLOBIN A1C
Est. average glucose Bld gHb Est-mCnc: 103 mg/dL
Hgb A1c MFr Bld: 5.2 % (ref 4.8–5.6)

## 2021-01-11 LAB — VITAMIN D 25 HYDROXY (VIT D DEFICIENCY, FRACTURES): Vit D, 25-Hydroxy: 57.4 ng/mL (ref 30.0–100.0)

## 2021-01-11 NOTE — Progress Notes (Signed)
Chief Complaint:   OBESITY [name] is here to discuss her progress with her obesity treatment plan along with follow-up of her obesity related diagnoses. [name] is on the Category 2 Plan and states she is following her eating plan approximately 85% of the time. [name] states she is walking 10 minutes 7 times per week.  Today's visit was #: 9 Starting weight: 256 lbs Starting date: 09/02/2020 Today's weight: 237 lbs Today's date: 01/10/2021 Total lbs lost to date: 19 lbs Total lbs lost since last in-office visit: 10 lbs Total weight loss percentage to date: -7.42%  Interim History: [name] says she is trying to increase her water intake. She is now up to 1-2 bottles of water a day. She is being more active for the at least 10 minutes a day. Her last OV in office was the end of November.   Plan: We discussed simple substitutions today, like non-fat greek yogurt mixed with mayo instead of 100% mayo and other little tips.   Assessment/Plan:   No orders of the defined types were placed in this encounter.   Medications Discontinued During This Encounter  Medication Reason  . Vitamin D, Ergocalciferol, (DRISDOL) 1.25 MG (50000 UNIT) CAPS capsule Reorder     Meds ordered this encounter  Medications  . Vitamin D, Ergocalciferol, (DRISDOL) 1.25 MG (50000 UNIT) CAPS capsule    Sig: Take 1 capsule (50,000 Units total) by mouth every 7 (seven) days.    Dispense:  4 capsule    Refill:  0     1. Other constipation Pt reports symptoms are much better with drinking more water. Now she is having 1-2 bowel movements a day. She is only using Miralax daily now and no other supplements.  Plan: Continue to increase water intake and increase movement.  2. Other hyperlipidemia Approximately 4 months ago, pt's LDL and TG were elevated at 119 and 202 respectively. We will recheck labs today.  Lab Results  Component Value Date   ALT 11 09/02/2020   AST 17 09/02/2020   ALKPHOS 82  09/02/2020   BILITOT 0.8 09/02/2020   Lab Results  Component Value Date   CHOL 182 01/10/2021   HDL 41 01/10/2021   LDLCALC 112 (H) 01/10/2021   TRIG 167 (H) 01/10/2021   CHOLHDL 4.4 01/10/2021   Plan: Check labs today. Continue prudent nutritional plan and weight loss.  3. Pre-diabetes [name] has a diagnosis of prediabetes based on her elevated HgA1c and was informed this puts her at greater risk of developing diabetes. She continues to work on diet and exercise to decrease her risk of diabetes. She denies nausea or hypoglycemia.   Ref. Range 09/02/2020 09:28  Hemoglobin A1C Latest Ref Range: 4.8 - 5.6 % 5.4   Lab Results  Component Value Date   INSULIN 12.0 01/10/2021   INSULIN 30.5 (H) 09/02/2020   Plan: Check labs today. [name] will continue to work on weight loss, exercise, and decreasing simple carbohydrates to help decrease the risk of diabetes.   4. Vitamin D deficiency [name]'s Vitamin D level was 65.1 on 09/02/2020. She is currently taking prescription vitamin D 50,000 IU each week. She denies nausea, vomiting or muscle weakness.   Ref. Range 09/02/2020 09:28  Vitamin D, 25-Hydroxy Latest Ref Range: 30.0 - 100.0 ng/mL 65.1    Plan: Check labs today. Low Vitamin D level contributes to fatigue and are associated with obesity, breast, and colon cancer. She agrees to continue to take prescription Vitamin D @50 ,000 IU  every week and will follow-up for routine testing of Vitamin D, at least 2-3 times per year to avoid over-replacement.  Refill- Vitamin D, Ergocalciferol, (DRISDOL) 1.25 MG (50000 UNIT) CAPS capsule; Take 1 capsule (50,000 Units total) by mouth every 7 (seven) days.  Dispense: 4 capsule; Refill: 0  5. Class 2 severe obesity with serious comorbidity and body mass index (BMI) of 39.0 to 39.9 in adult, unspecified obesity type ([place]) [name] is currently in the action stage of change. As such, her goal is to continue with weight loss efforts. She has agreed to the  Category 2 Plan.   Exercise goals: Increase as tolerated. Older adults should follow the adult guidelines. When older adults cannot meet the adult guidelines, they should be as physically active as their abilities and conditions will allow.   Behavioral modification strategies: meal planning and cooking strategies, avoiding temptations and planning for success.  [name] has agreed to follow-up with our clinic in 2 weeks. She was informed of the importance of frequent follow-up visits to maximize her success with intensive lifestyle modifications for her multiple health conditions.   [name] was informed we would discuss her lab results at her next visit unless there is a critical issue that needs to be addressed sooner. [name] agreed to keep her next visit at the agreed upon time to discuss these results.  Objective:   Blood pressure 140/82, pulse 69, temperature 97.9 F (36.6 C), height 5\' 5"  (1.651 m), weight 237 lb (107.5 kg), SpO2 96 %. Body mass index is 39.44 kg/m.  General: Cooperative, alert, well developed, in no acute distress. HEENT: Conjunctivae and lids unremarkable. Cardiovascular: Regular rhythm.  Lungs: Normal work of breathing. Neurologic: No focal deficits.   Lab Results  Component Value Date   CREATININE 0.91 09/02/2020   BUN 13 09/02/2020   NA 141 09/02/2020   K 4.5 09/02/2020   CL 104 09/02/2020   CO2 26 09/02/2020   Lab Results  Component Value Date   ALT 11 09/02/2020   AST 17 09/02/2020   ALKPHOS 82 09/02/2020   BILITOT 0.8 09/02/2020   Lab Results  Component Value Date   HGBA1C 5.2 01/10/2021   HGBA1C 5.4 09/02/2020   HGBA1C 5.3 04/17/2018   Lab Results  Component Value Date   INSULIN 12.0 01/10/2021   INSULIN 30.5 (H) 09/02/2020   Lab Results  Component Value Date   TSH 0.963 09/02/2020   Lab Results  Component Value Date   CHOL 182 01/10/2021   HDL 41 01/10/2021   LDLCALC 112 (H) 01/10/2021   TRIG 167 (H) 01/10/2021   CHOLHDL 4.4  01/10/2021   Lab Results  Component Value Date   WBC 8.5 09/02/2020   HGB 17.1 (H) 09/02/2020   HCT 51.3 (H) 09/02/2020   MCV 87 09/02/2020   PLT 202 09/02/2020   No results found for: IRON, TIBC, FERRITIN  Obesity Behavioral Intervention:   Approximately 15 minutes were spent on the discussion below.  ASK: We discussed the diagnosis of obesity with [name] today and [name] agreed to give [place] permission to discuss obesity behavioral modification therapy today.  ASSESS: [name] has the diagnosis of obesity and her BMI today is 39.5. [name] is in the action stage of change.   ADVISE: [name] was educated on the multiple health risks of obesity as well as the benefit of weight loss to improve her health. She was advised of the need for long term treatment and the importance of lifestyle modifications to improve her  current health and to decrease her risk of future health problems.  AGREE: Multiple dietary modification options and treatment options were discussed and [name] agreed to follow the recommendations documented in the above note.  ARRANGE: [name] was educated on the importance of frequent visits to treat obesity as outlined per CMS and USPSTF guidelines and agreed to schedule her next follow up appointment today.  Attestation Statements:   Reviewed by clinician on day of visit: allergies, medications, problem list, medical history, surgical history, family history, social history, and previous encounter notes.  Coral Ceo, am acting as Location manager for [employer], DO.  I have reviewed the above documentation for accuracy and completeness, and I agree with the above. Marjory Sneddon, D.O.  The [place] was signed into law in 2016 which includes the topic of electronic health records.  This provides immediate access to information in MyChart.  This includes consultation notes, operative notes, office notes, lab results and pathology  reports.  If you have any questions about what you read please let us know at your next visit so we can discuss your concerns and take corrective action if need be.  We are right here with you.

## 2021-01-16 ENCOUNTER — Other Ambulatory Visit: Payer: Self-pay | Admitting: Cardiovascular Disease

## 2021-01-24 ENCOUNTER — Telehealth (INDEPENDENT_AMBULATORY_CARE_PROVIDER_SITE_OTHER): Payer: Medicare Other | Admitting: Family Medicine

## 2021-01-24 ENCOUNTER — Other Ambulatory Visit: Payer: Self-pay

## 2021-01-24 ENCOUNTER — Encounter (INDEPENDENT_AMBULATORY_CARE_PROVIDER_SITE_OTHER): Payer: Self-pay | Admitting: Family Medicine

## 2021-01-24 VITALS — Wt 239.0 lb

## 2021-01-24 DIAGNOSIS — F3289 Other specified depressive episodes: Secondary | ICD-10-CM | POA: Diagnosis not present

## 2021-01-24 DIAGNOSIS — R7303 Prediabetes: Secondary | ICD-10-CM

## 2021-01-24 DIAGNOSIS — E7849 Other hyperlipidemia: Secondary | ICD-10-CM | POA: Diagnosis not present

## 2021-01-24 DIAGNOSIS — I10 Essential (primary) hypertension: Secondary | ICD-10-CM

## 2021-01-24 DIAGNOSIS — E559 Vitamin D deficiency, unspecified: Secondary | ICD-10-CM | POA: Diagnosis not present

## 2021-01-24 DIAGNOSIS — Z6839 Body mass index (BMI) 39.0-39.9, adult: Secondary | ICD-10-CM | POA: Diagnosis not present

## 2021-01-24 DIAGNOSIS — F339 Major depressive disorder, recurrent, unspecified: Secondary | ICD-10-CM

## 2021-01-24 MED ORDER — VITAMIN D (ERGOCALCIFEROL) 1.25 MG (50000 UNIT) PO CAPS: 50000.0000 [IU] | ORAL_CAPSULE | ORAL | 0 refills | Status: DC

## 2021-01-31 NOTE — Progress Notes (Signed)
Chief Complaint:   TeleHealth Visit:  Due to the COVID-19 pandemic, this visit was completed with telemedicine (audio/video) technology to reduce patient and provider exposure as well as to preserve personal protective equipment.   [name] has verbally consented to this TeleHealth visit. The patient is located at home, the provider is located at the [facility] office. The participants in this visit include the listed provider and patient and. The visit was conducted today via [place].  [name] was unable to use realtime audiovisual technology today and the telehealth visit was conducted via telephone.  OBESITY [name] is here to discuss her progress with her obesity treatment plan along with follow-up of her obesity related diagnoses.   Today's visit was #: 10 Starting weight: 256 lbs Starting date: 09/02/2020 Today's date: 01/24/2021  Interim History: [name]'s brother-in-law recently passed.  She says she has been doing increased emotional eating and feels she is up in weight.  She has also been doing more worrying about her husband.  Labs were drawn at her last visit, and she is here to review them as well.  Nutrition Plan: Category 2 Plan for 60-70% of the time. Activity: Walking in the house for 10-15 minutes 5 days per week. Stress: Elevated.  Assessment/Plan:   1. Prediabetes Discussed labs with patient today.  Controlled.  Goal is HgbA1c < 5.7.  Medication: None.    Plan:  She will continue to focus on protein-rich, low simple carbohydrate foods. We reviewed the importance of hydration, regular exercise for stress reduction, and restorative sleep.  Continue prudent nutritional plan and weight loss.  Increase activity.  Lab Results  Component Value Date   HGBA1C 5.2 01/10/2021   Lab Results  Component Value Date   INSULIN 12.0 01/10/2021   INSULIN 30.5 (H) 09/02/2020   2. Essential hypertension Medications: Coreg 6.25 mg twice daily.  She has a  blood pressure monitor, but is not checking it at home.  Denies issues or concerns.  Plan:  Continue Coreg.  Avoid buying foods that are: processed, frozen, or prepackaged to avoid excess salt.  Ambulatory blood pressure monitoring was encouraged with a goal of at least 2-3 times weekly or when feeling poorly.  She was instructed to keep a log for [place] to review at each office visit.   We will continue to monitor closely alongside her PCP and/or Specialist.  Regular follow up with PCP and specialists was also encouraged.  Continue prudent nutritional plan and weight loss.  BP Readings from Last 3 Encounters:  01/10/21 140/82  11/26/20 (!) 154/84  11/02/20 132/70   Lab Results  Component Value Date   CREATININE 0.91 09/02/2020   3. Other hyperlipidemia Discussed labs with patient today.  Course: Not at goal. Lipid-lowering medications: None.  She has tried statins in the past.   Plan:  Declines medication today, even after counseling.  Continue prudent nutritional plan and weight loss along with exercise.  Dietary changes: Increase soluble fiber, decrease simple carbohydrates, decrease saturated fat. Exercise changes: Moderate to vigorous-intensity aerobic activity 150 minutes per week or as tolerated. We will continue to monitor along with PCP/specialists as it pertains to her weight loss journey.  Lab Results  Component Value Date   CHOL 182 01/10/2021   HDL 41 01/10/2021   LDLCALC 112 (H) 01/10/2021   TRIG 167 (H) 01/10/2021   CHOLHDL 4.4 01/10/2021   Lab Results  Component Value Date   ALT 11 09/02/2020   AST 17 09/02/2020  ALKPHOS 82 09/02/2020   BILITOT 0.8 09/02/2020   The 10-year ASCVD risk score Mikey Bussing DC Jr., et al., 2013) is: 22.2%   Values used to calculate the score:     Age: 75 years     Sex: Female     Is Non-Hispanic African American: No     Diabetic: No     Tobacco smoker: No     Systolic Blood Pressure: 161 mmHg     Is BP treated: Yes     HDL Cholesterol: 41  mg/dL     Total Cholesterol: 182 mg/dL  4. Vitamin D deficiency Discussed labs with patient today.  At goal.  Current vitamin D is 57.4, tested on 01/10/2021. Optimal goal > 50 ng/dL.   Plan: Continue to take prescription Vitamin D @50 ,000 IU every week as prescribed.  Follow-up for routine testing of Vitamin D, at least 2-3 times per year to avoid over-replacement.  - Refill Vitamin D, Ergocalciferol, (DRISDOL) 1.25 MG (50000 UNIT) CAPS capsule; Take 1 capsule (50,000 Units total) by mouth every 7 (seven) days.  Dispense: 4 capsule; Refill: 0  5. Other depression, with emotional eating Her PCP has had her on Zoloft 25 mg daily for a couple of years now.  She has had increased worry and increased acute stress recently.  Plan:  Declines the need for medication adjustment today with her mood medications.  She will start exercising/walking outside for stress management.  Will closely follow.  6. Class 2 severe obesity with serious comorbidity and body mass index (BMI) of 39.0 to 39.9 in adult, unspecified obesity type [facility])  Course: [name] is currently in the action stage of change. As such, her goal is to continue with weight loss efforts.   Nutrition goals: She has agreed to the Category 2 Plan.   Exercise goals: Older adults should follow the adult guidelines. When older adults cannot meet the adult guidelines, they should be as physically active as their abilities and conditions will allow.  Older adults should do exercises that maintain or improve balance if they are at risk of falling.   Behavioral modification strategies: increasing lean protein intake, decreasing simple carbohydrates, meal planning and cooking strategies and planning for success.  [name] has agreed to follow-up with our clinic in 2-3 weeks. She was informed of the importance of frequent follow-up visits to maximize her success with intensive lifestyle modifications for her multiple health conditions.   Objective:    Weight 239 lb (108.4 kg). Body mass index is 39.77 kg/m.  General: Cooperative, alert, well developed, in no acute distress. HEENT: Conjunctivae and lids unremarkable. Cardiovascular: Regular rhythm.  Lungs: Normal work of breathing. Neurologic: No focal deficits.   Lab Results  Component Value Date   CREATININE 0.91 09/02/2020   BUN 13 09/02/2020   NA 141 09/02/2020   K 4.5 09/02/2020   CL 104 09/02/2020   CO2 26 09/02/2020   Lab Results  Component Value Date   ALT 11 09/02/2020   AST 17 09/02/2020   ALKPHOS 82 09/02/2020   BILITOT 0.8 09/02/2020   Lab Results  Component Value Date   HGBA1C 5.2 01/10/2021   HGBA1C 5.4 09/02/2020   HGBA1C 5.3 04/17/2018   Lab Results  Component Value Date   INSULIN 12.0 01/10/2021   INSULIN 30.5 (H) 09/02/2020   Lab Results  Component Value Date   TSH 0.963 09/02/2020   Lab Results  Component Value Date   CHOL 182 01/10/2021   HDL 41 01/10/2021  LDLCALC 112 (H) 01/10/2021   TRIG 167 (H) 01/10/2021   CHOLHDL 4.4 01/10/2021   Lab Results  Component Value Date   WBC 8.5 09/02/2020   HGB 17.1 (H) 09/02/2020   HCT 51.3 (H) 09/02/2020   MCV 87 09/02/2020   PLT 202 09/02/2020   Attestation Statements:   Reviewed by clinician on day of visit: allergies, medications, problem list, medical history, surgical history, family history, social history, and previous encounter notes.  I, Water quality scientist, CMA, am acting as Location manager for [employer], DO.  I have reviewed the above documentation for accuracy and completeness, and I agree with the above. Marjory Sneddon, D.O.  The [place] was signed into law in 2016 which includes the topic of electronic health records.  This provides immediate access to information in MyChart.  This includes consultation notes, operative notes, office notes, lab results and pathology reports.  If you have any questions about what you read please let us know at your next  visit so we can discuss your concerns and take corrective action if need be.  We are right here with you.

## 2021-02-16 ENCOUNTER — Other Ambulatory Visit: Payer: Self-pay

## 2021-02-16 ENCOUNTER — Encounter (INDEPENDENT_AMBULATORY_CARE_PROVIDER_SITE_OTHER): Payer: Self-pay | Admitting: Family Medicine

## 2021-02-16 ENCOUNTER — Ambulatory Visit (INDEPENDENT_AMBULATORY_CARE_PROVIDER_SITE_OTHER): Payer: Medicare Other | Admitting: Family Medicine

## 2021-02-16 VITALS — BP 142/90 | HR 66 | Temp 98.2°F | Ht 65.0 in | Wt 236.0 lb

## 2021-02-16 DIAGNOSIS — E559 Vitamin D deficiency, unspecified: Secondary | ICD-10-CM

## 2021-02-16 DIAGNOSIS — F32A Depression, unspecified: Secondary | ICD-10-CM | POA: Insufficient documentation

## 2021-02-16 DIAGNOSIS — F3289 Other specified depressive episodes: Secondary | ICD-10-CM | POA: Diagnosis not present

## 2021-02-24 NOTE — Progress Notes (Signed)
Chief Complaint:   OBESITY [name] is here to discuss her progress with her obesity treatment plan along with follow-up of her obesity related diagnoses.   Today's visit was #: 11 Starting weight: 256 lbs Starting date: 09/02/2020 Today's weight: 236 lbs Today's date: 02/16/2021 Total lbs lost to date: 20 lbs Body mass index is 39.27 kg/m.  Total weight loss percentage to date: -7.81%  Interim History:  [name]'s husband is possibly sabotaging her progress.  She has a lot of family stressors causing her to derail her own desires to be healthy.  Current Meal Plan: the Category 2 Plan for 85% of the time.  Current Exercise Plan: Walking for 10-20 minutes 5 times per week.  Assessment/Plan:   1. Other depression, with emotional eating [name] has increased life stressors lately with family issues.  She is taking Zoloft 25 mg daily.  Plan:  Declines referral to counseling for mood/food with Dr. Mallie Mussel at this time.  Advised her to contact PCP regarding referral to [facility] for CBT and deal with family stressors.  Continue medication, increase exercise, meditation/prayer, etc.  2. Class 2 severe obesity with serious comorbidity and body mass index (BMI) of 39.0 to 39.9 in adult, unspecified obesity type [facility])  Course: [name] is currently in the action stage of change. As such, her goal is to continue with weight loss efforts.   Nutrition goals: She has agreed to the Category 2 Plan.   Exercise goals: For substantial health benefits, adults should do at least 150 minutes (2 hours and 30 minutes) a week of moderate-intensity, or 75 minutes (1 hour and 15 minutes) a week of vigorous-intensity aerobic physical activity, or an equivalent combination of moderate- and vigorous-intensity aerobic activity. Aerobic activity should be performed in episodes of at least 10 minutes, and preferably, it should be spread throughout the week.  Behavioral modification strategies: emotional eating  strategies, dealing with family or coworker sabotage and planning for success.  [name] has agreed to follow-up with our clinic in 2-2.5 weeks. She was informed of the importance of frequent follow-up visits to maximize her success with intensive lifestyle modifications for her multiple health conditions.   Objective:   Blood pressure (!) 142/90, pulse 66, temperature 98.2 F (36.8 C), height 5\' 5"  (1.651 m), weight 236 lb (107 kg), SpO2 97 %. Body mass index is 39.27 kg/m.  General: Cooperative, alert, well developed, in no acute distress. HEENT: Conjunctivae and lids unremarkable. Cardiovascular: Regular rhythm.  Lungs: Normal work of breathing. Neurologic: No focal deficits.   Lab Results  Component Value Date   CREATININE 0.91 09/02/2020   BUN 13 09/02/2020   NA 141 09/02/2020   K 4.5 09/02/2020   CL 104 09/02/2020   CO2 26 09/02/2020   Lab Results  Component Value Date   ALT 11 09/02/2020   AST 17 09/02/2020   ALKPHOS 82 09/02/2020   BILITOT 0.8 09/02/2020   Lab Results  Component Value Date   HGBA1C 5.2 01/10/2021   HGBA1C 5.4 09/02/2020   HGBA1C 5.3 04/17/2018   Lab Results  Component Value Date   INSULIN 12.0 01/10/2021   INSULIN 30.5 (H) 09/02/2020   Lab Results  Component Value Date   TSH 0.963 09/02/2020   Lab Results  Component Value Date   CHOL 182 01/10/2021   HDL 41 01/10/2021   LDLCALC 112 (H) 01/10/2021   TRIG 167 (H) 01/10/2021   CHOLHDL 4.4 01/10/2021   Lab Results  Component Value Date  WBC 8.5 09/02/2020   HGB 17.1 (H) 09/02/2020   HCT 51.3 (H) 09/02/2020   MCV 87 09/02/2020   PLT 202 09/02/2020   Attestation Statements:   Reviewed by clinician on day of visit: allergies, medications, problem list, medical history, surgical history, family history, social history, and previous encounter notes.  Time spent on visit including pre-visit chart review and post-visit care and charting was >45 minutes.   I, Water quality scientist, CMA, am  acting as Location manager for [employer], DO.  I have reviewed the above documentation for accuracy and completeness, and I agree with the above. Marjory Sneddon, D.O.  The [place] was signed into law in 2016 which includes the topic of electronic health records.  This provides immediate access to information in MyChart.  This includes consultation notes, operative notes, office notes, lab results and pathology reports.  If you have any questions about what you read please let us know at your next visit so we can discuss your concerns and take corrective action if need be.  We are right here with you.

## 2021-03-03 ENCOUNTER — Encounter (INDEPENDENT_AMBULATORY_CARE_PROVIDER_SITE_OTHER): Payer: Self-pay | Admitting: Family Medicine

## 2021-03-03 ENCOUNTER — Ambulatory Visit (INDEPENDENT_AMBULATORY_CARE_PROVIDER_SITE_OTHER): Payer: Medicare Other | Admitting: Family Medicine

## 2021-03-03 ENCOUNTER — Other Ambulatory Visit: Payer: Self-pay

## 2021-03-03 VITALS — BP 142/81 | HR 77 | Temp 98.2°F | Ht 65.0 in | Wt 236.0 lb

## 2021-03-03 DIAGNOSIS — Z6841 Body Mass Index (BMI) 40.0 and over, adult: Secondary | ICD-10-CM | POA: Diagnosis not present

## 2021-03-03 DIAGNOSIS — F3289 Other specified depressive episodes: Secondary | ICD-10-CM | POA: Diagnosis not present
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
## 2021-03-07 ENCOUNTER — Other Ambulatory Visit: Payer: Self-pay

## 2021-03-07 ENCOUNTER — Telehealth (INDEPENDENT_AMBULATORY_CARE_PROVIDER_SITE_OTHER): Payer: Medicare Other | Admitting: Psychology

## 2021-03-07 DIAGNOSIS — F3289 Other specified depressive episodes: Secondary | ICD-10-CM

## 2021-03-14 NOTE — Progress Notes (Signed)
Chief Complaint:   OBESITY [name] is here to discuss her progress with her obesity treatment plan along with follow-up of her obesity related diagnoses.   Today's visit was #: 12 Starting weight: 256 lbs Starting date: 09/02/2020 Today's weight: 236 lbs Today's date: 03/03/2021 Total lbs lost to date: 20 lbs Body mass index is 39.27 kg/m.  Total weight loss percentage to date: -7.81%  Interim History:  [name] says that lunchtime is "too boring".  She says it is difficult to stay on plan.  She feels lazy and is on the computer/phone a lot.  Current Meal Plan: the Category 2 Plan for 90% of the time.  Current Exercise Plan: Walking path around the house for 10-20 minutes 3-4 times per week.  Assessment/Plan:   1. Other depression, with emotional eating Not at goal. Medication: Zoloft 25 mg daily.  She is still struggling with emotional eating.    Plan:  She finally agrees to see Dr. Mallie Mussel today after much discussion.  Handouts given to her regarding mindful and emotional eating.  Behavior modification techniques were discussed today to help deal with emotional/non-hunger eating behaviors. Patient was referred to Dr. Mallie Mussel, our Bariatric Psychologist, for evaluation due to her elevated PHQ-9 score and significant struggles with emotional eating.  2. Obesity, current BMI 39.4  Course: [name] is currently in the action stage of change. As such, her goal is to continue with weight loss efforts.   Nutrition goals: She has agreed to the Category 2 Plan with lunch options.   Exercise goals: She commits to walking outside for 10-20 minutes weather permitting.  Behavioral modification strategies: no skipping meals and planning for success.  [name] has agreed to follow-up with our clinic in 2 weeks. She was informed of the importance of frequent follow-up visits to maximize her success with intensive lifestyle modifications for her multiple health conditions.   Objective:    Blood pressure (!) 142/81, pulse 77, temperature 98.2 F (36.8 C), height 5\' 5"  (1.651 m), weight 236 lb (107 kg), SpO2 96 %. Body mass index is 39.27 kg/m.  General: Cooperative, alert, well developed, in no acute distress. HEENT: Conjunctivae and lids unremarkable. Cardiovascular: Regular rhythm.  Lungs: Normal work of breathing. Neurologic: No focal deficits.   Lab Results  Component Value Date   CREATININE 0.91 09/02/2020   BUN 13 09/02/2020   NA 141 09/02/2020   K 4.5 09/02/2020   CL 104 09/02/2020   CO2 26 09/02/2020   Lab Results  Component Value Date   ALT 11 09/02/2020   AST 17 09/02/2020   ALKPHOS 82 09/02/2020   BILITOT 0.8 09/02/2020   Lab Results  Component Value Date   HGBA1C 5.2 01/10/2021   HGBA1C 5.4 09/02/2020   HGBA1C 5.3 04/17/2018   Lab Results  Component Value Date   INSULIN 12.0 01/10/2021   INSULIN 30.5 (H) 09/02/2020   Lab Results  Component Value Date   TSH 0.963 09/02/2020   Lab Results  Component Value Date   CHOL 182 01/10/2021   HDL 41 01/10/2021   LDLCALC 112 (H) 01/10/2021   TRIG 167 (H) 01/10/2021   CHOLHDL 4.4 01/10/2021   Lab Results  Component Value Date   WBC 8.5 09/02/2020   HGB 17.1 (H) 09/02/2020   HCT 51.3 (H) 09/02/2020   MCV 87 09/02/2020   PLT 202 09/02/2020   Obesity Behavioral Intervention:   Approximately 15 minutes were spent on the discussion below.  ASK: We discussed the diagnosis  of obesity with Jalexis today and Alinna agreed to give [place] permission to discuss obesity behavioral modification therapy today.  ASSESS: [name] has the diagnosis of obesity and her BMI today is 39.4. [name] is in the action stage of change.   ADVISE: Marbella was educated on the multiple health risks of obesity as well as the benefit of weight loss to improve her health. She was advised of the need for long term treatment and the importance of lifestyle modifications to improve her current health and to decrease her  risk of future health problems.  AGREE: Multiple dietary modification options and treatment options were discussed and [name] agreed to follow the recommendations documented in the above note.  ARRANGE: [name] was educated on the importance of frequent visits to treat obesity as outlined per CMS and USPSTF guidelines and agreed to schedule her next follow up appointment today.  Attestation Statements:   Reviewed by clinician on day of visit: allergies, medications, problem list, medical history, surgical history, family history, social history, and previous encounter notes.  I, Water quality scientist, CMA, am acting as Location manager for [employer], DO.  I have reviewed the above documentation for accuracy and completeness, and I agree with the above. Marjory Sneddon, D.O.  The [place] was signed into law in 2016 which includes the topic of electronic health records.  This provides immediate access to information in MyChart.  This includes consultation notes, operative notes, office notes, lab results and pathology reports.  If you have any questions about what you read please let us know at your next visit so we can discuss your concerns and take corrective action if need be.  We are right here with you.

## 2021-03-18 ENCOUNTER — Encounter (INDEPENDENT_AMBULATORY_CARE_PROVIDER_SITE_OTHER): Payer: Self-pay | Admitting: Family Medicine

## 2021-03-21 ENCOUNTER — Telehealth (INDEPENDENT_AMBULATORY_CARE_PROVIDER_SITE_OTHER): Payer: Medicare Other | Admitting: Psychology

## 2021-03-21 DIAGNOSIS — I129 Hypertensive chronic kidney disease with stage 1 through stage 4 chronic kidney disease, or unspecified chronic kidney disease: Secondary | ICD-10-CM | POA: Diagnosis not present

## 2021-03-21 DIAGNOSIS — K219 Gastro-esophageal reflux disease without esophagitis: Secondary | ICD-10-CM | POA: Diagnosis not present

## 2021-03-21 DIAGNOSIS — R195 Other fecal abnormalities: Secondary | ICD-10-CM | POA: Diagnosis not present

## 2021-03-21 DIAGNOSIS — K649 Unspecified hemorrhoids: Secondary | ICD-10-CM | POA: Diagnosis not present

## 2021-03-21 DIAGNOSIS — K5900 Constipation, unspecified: Secondary | ICD-10-CM | POA: Diagnosis not present

## 2021-03-24 ENCOUNTER — Ambulatory Visit (INDEPENDENT_AMBULATORY_CARE_PROVIDER_SITE_OTHER): Payer: Medicare Other | Admitting: Family Medicine

## 2021-04-01 ENCOUNTER — Ambulatory Visit: Payer: Medicare Other | Admitting: Physician Assistant

## 2021-04-25 ENCOUNTER — Other Ambulatory Visit: Payer: Self-pay | Admitting: Family Medicine

## 2021-04-25 DIAGNOSIS — Z1231 Encounter for screening mammogram for malignant neoplasm of breast: Secondary | ICD-10-CM

## 2021-06-20 ENCOUNTER — Ambulatory Visit
Admission: RE | Admit: 2021-06-20 | Discharge: 2021-06-20 | Disposition: A | Discharge status: Home / Self Care | Payer: Medicare Other | Source: Ambulatory Visit | Attending: Family Medicine | Admitting: Family Medicine

## 2021-06-20 ENCOUNTER — Other Ambulatory Visit: Payer: Self-pay

## 2021-06-20 DIAGNOSIS — Z1231 Encounter for screening mammogram for malignant neoplasm of breast: Secondary | ICD-10-CM | POA: Diagnosis not present

## 2021-06-21 DIAGNOSIS — D224 Melanocytic nevi of scalp and neck: Secondary | ICD-10-CM | POA: Diagnosis not present

## 2021-06-21 DIAGNOSIS — L821 Other seborrheic keratosis: Secondary | ICD-10-CM | POA: Diagnosis not present

## 2021-06-21 DIAGNOSIS — D225 Melanocytic nevi of trunk: Secondary | ICD-10-CM | POA: Diagnosis not present

## 2021-06-21 DIAGNOSIS — Z85828 Personal history of other malignant neoplasm of skin: Secondary | ICD-10-CM | POA: Diagnosis not present

## 2021-06-21 DIAGNOSIS — L720 Epidermal cyst: Secondary | ICD-10-CM | POA: Diagnosis not present

## 2021-06-21 DIAGNOSIS — L738 Other specified follicular disorders: Secondary | ICD-10-CM | POA: Diagnosis not present

## 2021-06-21 DIAGNOSIS — D1801 Hemangioma of skin and subcutaneous tissue: Secondary | ICD-10-CM | POA: Diagnosis not present

## 2021-06-24 ENCOUNTER — Other Ambulatory Visit: Payer: Self-pay | Admitting: Family Medicine

## 2021-06-24 DIAGNOSIS — R928 Other abnormal and inconclusive findings on diagnostic imaging of breast: Secondary | ICD-10-CM

## 2021-07-04 DIAGNOSIS — E7800 Pure hypercholesterolemia, unspecified: Secondary | ICD-10-CM | POA: Diagnosis not present

## 2021-07-04 DIAGNOSIS — R7303 Prediabetes: Secondary | ICD-10-CM | POA: Diagnosis not present

## 2021-07-04 DIAGNOSIS — E669 Obesity, unspecified: Secondary | ICD-10-CM | POA: Diagnosis not present

## 2021-07-04 DIAGNOSIS — N183 Chronic kidney disease, stage 3 unspecified: Secondary | ICD-10-CM | POA: Diagnosis not present

## 2021-07-04 DIAGNOSIS — I129 Hypertensive chronic kidney disease with stage 1 through stage 4 chronic kidney disease, or unspecified chronic kidney disease: Secondary | ICD-10-CM | POA: Diagnosis not present

## 2021-07-11 ENCOUNTER — Other Ambulatory Visit: Payer: Self-pay

## 2021-07-11 ENCOUNTER — Other Ambulatory Visit: Payer: Self-pay | Admitting: Family Medicine

## 2021-07-11 ENCOUNTER — Ambulatory Visit
Admission: RE | Admit: 2021-07-11 | Discharge: 2021-07-11 | Disposition: A | Discharge status: Home / Self Care | Payer: Medicare Other | Source: Ambulatory Visit | Attending: Family Medicine | Admitting: Family Medicine

## 2021-07-11 DIAGNOSIS — R928 Other abnormal and inconclusive findings on diagnostic imaging of breast: Secondary | ICD-10-CM

## 2021-07-11 DIAGNOSIS — N6452 Nipple discharge: Secondary | ICD-10-CM | POA: Diagnosis not present

## 2021-07-11 DIAGNOSIS — N6321 Unspecified lump in the left breast, upper outer quadrant: Secondary | ICD-10-CM | POA: Diagnosis not present

## 2021-07-11 DIAGNOSIS — R922 Inconclusive mammogram: Secondary | ICD-10-CM | POA: Diagnosis not present

## 2021-07-19 ENCOUNTER — Ambulatory Visit
Admission: RE | Admit: 2021-07-19 | Discharge: 2021-07-19 | Disposition: A | Discharge status: Home / Self Care | Payer: Medicare Other | Source: Ambulatory Visit | Attending: Family Medicine | Admitting: Family Medicine

## 2021-07-19 ENCOUNTER — Other Ambulatory Visit: Payer: Self-pay

## 2021-07-19 ENCOUNTER — Other Ambulatory Visit: Payer: Self-pay | Admitting: Family Medicine

## 2021-07-19 DIAGNOSIS — N6321 Unspecified lump in the left breast, upper outer quadrant: Secondary | ICD-10-CM | POA: Diagnosis not present

## 2021-07-19 DIAGNOSIS — D242 Benign neoplasm of left breast: Secondary | ICD-10-CM | POA: Diagnosis not present

## 2021-07-19 DIAGNOSIS — R928 Other abnormal and inconclusive findings on diagnostic imaging of breast: Secondary | ICD-10-CM

## 2021-07-19 DIAGNOSIS — N6320 Unspecified lump in the left breast, unspecified quadrant: Secondary | ICD-10-CM

## 2021-07-21 ENCOUNTER — Ambulatory Visit
Admission: RE | Admit: 2021-07-21 | Discharge: 2021-07-21 | Disposition: A | Discharge status: Home / Self Care | Payer: Medicare Other | Source: Ambulatory Visit | Attending: Family Medicine | Admitting: Family Medicine

## 2021-07-21 ENCOUNTER — Other Ambulatory Visit: Payer: Self-pay

## 2021-07-21 DIAGNOSIS — N6012 Diffuse cystic mastopathy of left breast: Secondary | ICD-10-CM | POA: Diagnosis not present

## 2021-07-21 DIAGNOSIS — N6321 Unspecified lump in the left breast, upper outer quadrant: Secondary | ICD-10-CM | POA: Diagnosis not present

## 2021-07-21 DIAGNOSIS — N6320 Unspecified lump in the left breast, unspecified quadrant: Secondary | ICD-10-CM

## 2021-10-19 ENCOUNTER — Other Ambulatory Visit: Payer: Self-pay

## 2021-10-19 ENCOUNTER — Ambulatory Visit (INDEPENDENT_AMBULATORY_CARE_PROVIDER_SITE_OTHER): Payer: Medicare Other | Admitting: Cardiovascular Disease

## 2021-10-19 ENCOUNTER — Encounter: Payer: Self-pay | Admitting: Cardiovascular Disease

## 2021-10-19 VITALS — BP 160/90 | HR 66 | Resp 20 | Ht 66.0 in | Wt 249.0 lb

## 2021-10-19 DIAGNOSIS — E782 Mixed hyperlipidemia: Secondary | ICD-10-CM | POA: Diagnosis not present

## 2021-10-19 DIAGNOSIS — R0609 Other forms of dyspnea: Secondary | ICD-10-CM

## 2021-10-19 DIAGNOSIS — I10 Essential (primary) hypertension: Secondary | ICD-10-CM

## 2021-10-19 MED ORDER — TRIAMTERENE-HCTZ 37.5-25 MG PO TABS: 1.0000 | ORAL_TABLET | Freq: Every day | ORAL | 11 refills | Status: DC

## 2021-10-19 NOTE — Progress Notes (Signed)
Cardiology Office Note    Date:  10/23/2021   ID:  AVLEEN BORDWELL, DOB 02/09/1946, MRN [id number]  PCP:  Kelton Pillar, MD  Cardiologist:   Sanda Klein, MD   Chief Complaint  Patient presents with   Fatigue    History of Present Illness:  [name] is a 75 y.o. female with obesity and associated hypertension and hyperlipidemia returning for follow-up.   She has not had any serious health issues since her last appointment.  She does describe fatigue and this appears to be due to her beta-blocker.  She states that she feels sleepy within an hour of taking her carvedilol dose in the morning and the pattern repeats itself when she takes the second dose in the evening.  She has not had syncope or palpitations and denies dizziness.  She has trace ankle swelling always a little worse on the right than on the left (she has a history of right total knee replacement.  She has not had falls or injuries.  She denies focal neurological complaints.  She does not have orthopnea or PND and does not have chest pain either at rest or with activity.  She has chronic shortness of breath NYHA functional class II.  She remains morbidly obese and is very frustrated with her inability to keep the weight off, even though she is successfully lost about 30 pounds at a time on various diets.  She was intolerant of amlodipine due to ankle swelling and has a history of cough with ACE inhibitors and diarrhea with valsartan and losartan.  As before, she has borderline elevated LDL cholesterol at 117, but she does not have known CAD or PAD.  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  In the past, she has periods of dyspnea that had clear association with gastroesophageal reflux.   She had a normal chest CT angiogram for presumed pulmonary embolism and a normal nuclear stress test in June 2018.  The chest  CT, while not a coronary angiogram study, was remarkable for the absence of coronary or aortic calcification.  She had a normal nuclear stress test in 2018.  She had an echocardiogram in September 2019 that showed normal left ventricular systolic function with mild diastolic dysfunction.  She has a strong family history of coronary disease and a personal history of hypertension and hyperlipidemia and borderline diabetes mellitus.  She underwent cardiac catheterization in 2007 with angiographically patent coronary arteries without meaningful atherosclerosis. She has been intolerant to statin medications and fibrates due to severe myalgia.    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   Swelling    Vitamin D deficiency     Past Surgical History:  Procedure Laterality Date   ABDOMINAL HYSTERECTOMY  2001   [place]   CARDIAC CATHETERIZATION  2007   Patent coronary arteries   KNEE ARTHROSCOPY Right 1999   LAPAROSCOPIC CHOLECYSTECTOMY  2007   TONSILECTOMY/ADENOIDECTOMY WITH MYRINGOTOMY  1963   TOTAL KNEE ARTHROPLASTY Right 07/21/2016   Procedure: RIGHT TOTAL KNEE ARTHROPLASTY;  Surgeon: Netta Cedars, MD;  Location: [facility];  Service: Orthopedics;  Laterality: Right;   TOTAL KNEE ARTHROPLASTY     right    Current Medications: Outpatient Medications Prior to Visit  Medication Sig Dispense Refill   aspirin EC 81 MG tablet Take 81 mg by mouth daily. Swallow whole.     calcium acetate, Phos Binder, (PHOSLYRA) 667 MG/5ML SOLN Take by mouth in the morning and at bedtime.      carvedilol (COREG) 6.25 MG tablet TAKE 1 TABLET BY MOUTH TWICE A DAY 180 tablet 3    diphenhydrAMINE (BENADRYL) 25 mg capsule Take 25 mg by mouth every 6 (six) hours as needed for allergies.      ELDERBERRY PO Take by mouth.     FLAXSEED, LINSEED, PO Take 1,000 mg by mouth daily.     KRILL OIL PO Take 1 capsule by mouth daily.      pantoprazole (PROTONIX) 40 MG tablet Take 1 tablet (40 mg total) by mouth daily. 90 tablet 3   polyethylene glycol (MIRALAX / GLYCOLAX) 17 g packet Take 17 g by mouth 2 (two) times daily. Until stooling regularly 60 packet 0   senna-docusate (SENOKOT-S) 8.6-50 MG tablet Take 2 tablets by mouth 2 (two) times daily. Until stooling regularly 60 tablet 0   sertraline (ZOLOFT) 50 MG tablet Take 25 mg by mouth daily.     Turmeric Curcumin 500 MG CAPS Take 1 capsule by mouth daily.     vitamin C (ASCORBIC ACID) 500 MG tablet Take 500 mg by mouth daily.     Vitamin D, Ergocalciferol, (DRISDOL) 1.25 MG (50000 UNIT) CAPS capsule Take 1 capsule (50,000 Units total) by mouth every 7 (seven) days. 4 capsule 0   Lecith-Inosi-Chol-B12-Liver (LIVERITE PO) Take 2 tablets by mouth at bedtime. (Patient not taking: Reported on 10/19/2021)     OVER THE COUNTER MEDICATION Take 1 tablet by mouth daily. 1000mg  milk thissel (Patient not taking: Reported on 10/19/2021)     No facility-administered medications prior to visit.     Allergies:   Statins, Ace inhibitors, Losartan, Tricor [fenofibrate], and Penicillins   Social History   Socioeconomic History   Marital status: Married    Spouse name: Not on file   Number of children: 1   Years of education: 16   Highest education level: Bachelor's degree (e.g., BA, AB, BS)  Occupational History   Occupation: Retired  Tobacco Use   Smoking status: Never   Smokeless tobacco: Never  Vaping Use   Vaping Use: Never used  Substance and Sexual Activity   Alcohol use: No   Drug use: No   Sexual activity: Not on file  Other Topics Concern   Not on file  Social History Narrative   Lives at home with her husband.    Right-handed.   1-2 cups caffeine per day.   Social Determinants of Health   Financial Resource Strain: Not on file  Food Insecurity: Not on file  Transportation Needs: Not on file  Physical Activity: Not on file  Stress: Not on file  Social Connections: Not on file     Family History:  The patient's family history includes Anxiety disorder in her father; Cancer in her father; Colon cancer (age of onset: 63) in her sister; Colon cancer (age of onset: 37) in her father; Dementia in her mother; Depression in her father and mother; Heart disease (age  of onset: 43) in her mother; Heart failure in her father; Hyperlipidemia in her mother; Hypertension in her father and mother; Prostate cancer in her father; Sudden death (age of onset: 62) in her paternal grandfather; Sudden death (age of onset: 58) in her maternal grandfather; Thyroid disease in her mother.   ROS:   Please see the history of present illness.    ROS all other systems are reviewed and are negative  PHYSICAL EXAM:   VS:  BP (!) 160/90 (BP Location: Left Arm, Patient Position: Sitting, Cuff Size: Normal)   Pulse 66   Resp 20   Ht 5\' 6"  (1.676 m)   Wt 249 lb (112.9 kg)   SpO2 95%   BMI 40.19 kg/m      General: Alert, oriented x3, no distress, morbidly obese Head: no evidence of trauma, PERRL, EOMI, no exophtalmos or lid lag, no myxedema, no xanthelasma; normal ears, nose and oropharynx Neck: normal jugular venous pulsations and no hepatojugular reflux; brisk carotid pulses without delay and no carotid bruits Chest: clear to auscultation, no signs of consolidation by percussion or palpation, normal fremitus, symmetrical and full respiratory excursions Cardiovascular: normal position and quality of the apical impulse, regular rhythm, normal first and second heart sounds, no murmurs, rubs or gallops Abdomen: no tenderness or distention, no masses by palpation, no abnormal pulsatility or arterial bruits, normal bowel sounds, no  hepatosplenomegaly Extremities: no clubbing, cyanosis or edema; 2+ radial, ulnar and brachial pulses bilaterally; 2+ right femoral, posterior tibial and dorsalis pedis pulses; 2+ left femoral, posterior tibial and dorsalis pedis pulses; no subclavian or femoral bruits Neurological: grossly nonfocal Psych: Normal mood and affect     Wt Readings from Last 3 Encounters:  10/19/21 249 lb (112.9 kg)  03/03/21 236 lb (107 kg)  02/16/21 236 lb (107 kg)      Studies/Labs Reviewed:   ECHO 08/28/2018: - Left ventricle: The cavity size was normal. Wall thickness was    increased in a pattern of moderate LVH. Systolic function was    normal. The estimated ejection fraction was in the range of 60%    to 65%. Wall motion was normal; there were no regional wall    motion abnormalities. Doppler parameters are consistent with    abnormal left ventricular relaxation (grade 1 diastolic    dysfunction). The E/e&' ratio is between 8-15, suggesting    indeterminate LV filling pressure.  - Mitral valve: Mildly thickened leaflets . There was trivial    regurgitation.  - Left atrium: The atrium was normal in size.  - Tricuspid valve: There was trivial regurgitation.  - Pulmonary arteries: PA peak pressure: 27 mm Hg (S).  - Inferior vena cava: The vessel was normal in size. The    respirophasic diameter changes were in the normal range (>= 50%),    consistent with normal central venous pressure.   Impressions:   - LVEF 60-65%, moderate LVH, normal wall motion, grade 1 DD,    indeterminate LV filling pressure, trivial MR, norma LA size,    trivial TR, RVSP 27 mmHg, normal IVC.   EKG:  EKG is ordered today.  It shows NSR, normal tracing,  QTc 417 ms Recent Labs: Dr. Laurann Montana, 06/25/2019 Total cholesterol 208, LDL 123, HDL 48, triglycerides of 91 Hemoglobin A1c 5.3%, creatinine 0.88, potassium 4.5, normal liver function tests  09/02/2020 Total cholesterol 199, HDL 45, LDL 119, triglycerides  202 Hemoglobin A1c 5.4%, creatinine 0.91, potassium 4.5, normal liver function tests and normal TSH  07/04/2021 Cholesterol 190, HDL 39, LDL 117, triglycerides 196 Hemoglobin A1c 5.9%, creatinine 0.97, potassium 4.5, normal liver function tests and normal TSH  ASSESSMENT:    1. Essential hypertension   2. Mixed hyperlipidemia   3. Morbid obesity ([place])   4. Exertional dyspnea      PLAN:  In order of problems listed above:  HTN: Fatigue and sleepiness with carvedilol.  We will cut the dose of carvedilol in half and start triamterene hydrochlorothiazide.  Had issues with calcium channel blockers, ACE inhibitors and ARB. HLP: Although not optimal, her lipid profile is acceptable since she does not have known CAD or PAD or DM.  Continue to pay attention to a diet low in saturated fat, sugars and carbohydrates with high glycemic index with increased intake of unsaturated fat and lean protein. Morbid obesity: Encouraged her to keep up the good work she is doing with a healthy diet and regular exercise, even if her weight does not change. Exertional dyspnea: Previous cardiac work-up with echocardiography and coronary angiography as well as CT of the chest with contrast have not shown abnormalities that could be implicated.  Clearly her weight is part of the problem.  Note relationship with symptoms of GERD in the past.  Consider pulmonary evaluation.    Medication Adjustments/Labs and Tests Ordered: Current medicines are reviewed at length with the patient today.  Concerns regarding medicines are outlined above.  Medication changes, Labs and Tests ordered today are listed in the Patient Instructions below. Patient Instructions  Medication Instructions:  START Triamterene-hydrochlorothiazide 37.5 -25 mg once daily  *If you need a refill on your cardiac medications before your next appointment, please call your pharmacy*   Lab Work: None ordered If you have labs (blood work) drawn today  and your tests are completely normal, you will receive your results only by: [place] (if you have MyChart) OR A paper copy in the mail If you have any lab test that is abnormal or we need to change your treatment, we will call you to review the results.   Testing/Procedures: None ordered   Follow-Up: At [facility], you and your health needs are our priority.  As part of our continuing mission to provide you with exceptional heart care, we have created designated Provider Care Teams.  These Care Teams include your primary Cardiologist (physician) and Advanced Practice Providers (APPs -  Physician Assistants and Nurse Practitioners) who all work together to provide you with the care you need, when you need it.  We recommend signing up for the patient portal called "MyChart".  Sign up information is provided on this After Visit Summary.  MyChart is used to connect with patients for Virtual Visits (Telemedicine).  Patients are able to view lab/test results, encounter notes, upcoming appointments, etc.  Non-urgent messages can be sent to your provider as well.   To learn more about what you can do with MyChart, go to [email or web address].    Your next appointment:   12 month(s)  The format for your next appointment:   In Person  Provider:   Sanda Klein, MD     Other Instructions Dr. Sallyanne Kuster would like you to check your blood pressure daily for the next 2 weeks.  Keep a journal of these daily blood pressure and heart rate readings and call our office or send a message through [facility] with the results. Thank you!  It is best to check your BP 1-2 hours after taking your medications to see the medications effectiveness  on your BP.    Here are some tips that our clinical pharmacists share for home BP monitoring:          Rest 10 minutes before taking your blood pressure.          Don't smoke or drink caffeinated beverages for at least 30 minutes before.          Take  your blood pressure before (not after) you eat.          Sit comfortably with your back supported and both feet on the floor (don't cross your legs).          Elevate your arm to heart level on a table or a desk.          Use the proper sized cuff. It should fit smoothly and snugly around your bare upper arm. There should be enough room to slip a fingertip under the cuff. The bottom edge of the cuff should be 1 inch above the crease of the elbow.    Signed, Sanda Klein, MD  10/23/2021 2:03 PM    [address], [place], [place]  [address] Phone: [phone]; Fax: [phone]

## 2021-10-19 NOTE — Patient Instructions (Signed)
Medication Instructions:  START Triamterene-hydrochlorothiazide 37.5 -25 mg once daily  *If you need a refill on your cardiac medications before your next appointment, please call your pharmacy*   Lab Work: None ordered If you have labs (blood work) drawn today and your tests are completely normal, you will receive your results only by: [place] (if you have MyChart) OR A paper copy in the mail If you have any lab test that is abnormal or we need to change your treatment, we will call you to review the results.   Testing/Procedures: None ordered   Follow-Up: At [facility], you and your health needs are our priority.  As part of our continuing mission to provide you with exceptional heart care, we have created designated Provider Care Teams.  These Care Teams include your primary Cardiologist (physician) and Advanced Practice Providers (APPs -  Physician Assistants and Nurse Practitioners) who all work together to provide you with the care you need, when you need it.  We recommend signing up for the patient portal called "MyChart".  Sign up information is provided on this After Visit Summary.  MyChart is used to connect with patients for Virtual Visits (Telemedicine).  Patients are able to view lab/test results, encounter notes, upcoming appointments, etc.  Non-urgent messages can be sent to your provider as well.   To learn more about what you can do with MyChart, go to [email or web address].    Your next appointment:   12 month(s)  The format for your next appointment:   In Person  Provider:   Sanda Klein, MD     Other Instructions Dr. Sallyanne Kuster would like you to check your blood pressure daily for the next 2 weeks.  Keep a journal of these daily blood pressure and heart rate readings and call our office or send a message through [facility] with the results. Thank you!  It is best to check your BP 1-2 hours after taking your medications to see the medications  effectiveness on your BP.    Here are some tips that our clinical pharmacists share for home BP monitoring:          Rest 10 minutes before taking your blood pressure.          Don't smoke or drink caffeinated beverages for at least 30 minutes before.          Take your blood pressure before (not after) you eat.          Sit comfortably with your back supported and both feet on the floor (don't cross your legs).          Elevate your arm to heart level on a table or a desk.          Use the proper sized cuff. It should fit smoothly and snugly around your bare upper arm. There should be enough room to slip a fingertip under the cuff. The bottom edge of the cuff should be 1 inch above the crease of the elbow.

## 2021-10-20 ENCOUNTER — Other Ambulatory Visit: Payer: Self-pay

## 2021-11-07 ENCOUNTER — Encounter: Payer: Self-pay | Admitting: Cardiovascular Disease

## 2022-01-09 ENCOUNTER — Other Ambulatory Visit: Payer: Self-pay | Admitting: Cardiovascular Disease

## 2022-01-10 ENCOUNTER — Telehealth: Payer: Self-pay | Admitting: Cardiovascular Disease

## 2022-01-10 DIAGNOSIS — M1990 Unspecified osteoarthritis, unspecified site: Secondary | ICD-10-CM | POA: Diagnosis not present

## 2022-01-10 DIAGNOSIS — Z0000 Encounter for general adult medical examination without abnormal findings: Secondary | ICD-10-CM | POA: Diagnosis not present

## 2022-01-10 DIAGNOSIS — Z23 Encounter for immunization: Secondary | ICD-10-CM | POA: Diagnosis not present

## 2022-01-10 DIAGNOSIS — J45909 Unspecified asthma, uncomplicated: Secondary | ICD-10-CM | POA: Diagnosis not present

## 2022-01-10 DIAGNOSIS — M8580 Other specified disorders of bone density and structure, unspecified site: Secondary | ICD-10-CM | POA: Diagnosis not present

## 2022-01-10 DIAGNOSIS — F39 Unspecified mood [affective] disorder: Secondary | ICD-10-CM | POA: Diagnosis not present

## 2022-01-10 DIAGNOSIS — I129 Hypertensive chronic kidney disease with stage 1 through stage 4 chronic kidney disease, or unspecified chronic kidney disease: Secondary | ICD-10-CM | POA: Diagnosis not present

## 2022-01-10 DIAGNOSIS — K219 Gastro-esophageal reflux disease without esophagitis: Secondary | ICD-10-CM | POA: Diagnosis not present

## 2022-01-10 DIAGNOSIS — N183 Chronic kidney disease, stage 3 unspecified: Secondary | ICD-10-CM | POA: Diagnosis not present

## 2022-01-10 DIAGNOSIS — R7303 Prediabetes: Secondary | ICD-10-CM | POA: Diagnosis not present

## 2022-01-10 DIAGNOSIS — Z1389 Encounter for screening for other disorder: Secondary | ICD-10-CM | POA: Diagnosis not present

## 2022-01-10 DIAGNOSIS — E559 Vitamin D deficiency, unspecified: Secondary | ICD-10-CM | POA: Diagnosis not present

## 2022-01-10 DIAGNOSIS — E7800 Pure hypercholesterolemia, unspecified: Secondary | ICD-10-CM | POA: Diagnosis not present

## 2022-01-10 NOTE — Telephone Encounter (Signed)
Called patient no answer. Left message to call back.

## 2022-01-10 NOTE — Telephone Encounter (Signed)
Pt c/o medication issue:  1. Name of Medication: triamterene-hydrochlorothiazide (MAXZIDE-25) 37.5-25 MG tablet  2. How are you currently taking this medication (dosage and times per day)? Not currently taking   3. Are you having a reaction (difficulty breathing--STAT)? Makes legs hurt   4. What is your medication issue? Pt says this medication makes her legs hurt.. pcp suggests that pt starts taking amlodipine as an alternative but wanted to check with Dr. Loletha Grayer for further advice... please advise

## 2022-01-11 ENCOUNTER — Encounter: Payer: Self-pay | Admitting: Cardiovascular Disease

## 2022-01-11 NOTE — Telephone Encounter (Signed)
LMTCB regarding her Mychart message aboput her bp medications.

## 2022-01-12 NOTE — Telephone Encounter (Signed)
Left message to call back. To find out  if she has any blood pressure reading or weight  readings

## 2022-01-12 NOTE — Telephone Encounter (Signed)
Patient returned call

## 2022-01-12 NOTE — Telephone Encounter (Signed)
We have tried her on amlodipine before and she stopped it for severe leg swelling

## 2022-01-13 NOTE — Telephone Encounter (Signed)
Carvedilol should be continued regardless. Leg pain is not an expected side effect of the triamterene-HCTZ. I think it is very likely that restarting the amlodipine without a diuretic would certainly cause issues with swelling again.  Can we please first try to cut the triamterene-HCTZ to half dose first? If she tolerates the lower dose, BP remains controlled and the leg pain resolves, we have a solution. If the BP rises, we could probably get away with a lower dose of amlodipine and keeping the diuretic going wuld help prevent the edema side effect. If the leg pain does not go away, we will have to stop triamterene-HCTZ altogether and try a different solution.  In summary, please cut the triamterene-HCTZ in half, send [place] BP readings in 1-2 weeks.

## 2022-01-13 NOTE — Telephone Encounter (Signed)
See MyChart messages pertaining to this subject.

## 2022-01-17 DIAGNOSIS — H52203 Unspecified astigmatism, bilateral: Secondary | ICD-10-CM | POA: Diagnosis not present

## 2022-01-17 DIAGNOSIS — H524 Presbyopia: Secondary | ICD-10-CM | POA: Diagnosis not present

## 2022-01-17 DIAGNOSIS — Z961 Presence of intraocular lens: Secondary | ICD-10-CM | POA: Diagnosis not present

## 2022-01-18 DIAGNOSIS — M545 Low back pain, unspecified: Secondary | ICD-10-CM | POA: Diagnosis not present

## 2022-01-18 DIAGNOSIS — M25561 Pain in right knee: Secondary | ICD-10-CM | POA: Diagnosis not present

## 2022-01-18 DIAGNOSIS — M25562 Pain in left knee: Secondary | ICD-10-CM | POA: Diagnosis not present

## 2022-01-27 IMAGING — MG DIGITAL DIAGNOSTIC BILAT W/ TOMO W/ CAD
8 of 14 series · 8 of 40 positions shown · non-contrast
Comparison: Previous exam(s).

CLINICAL DATA: Patient was recalled from screening mammogram for
possible asymmetry in the right and possible asymmetries in the
left. The patient denies nipple discharge.



[R CC synth-2D]
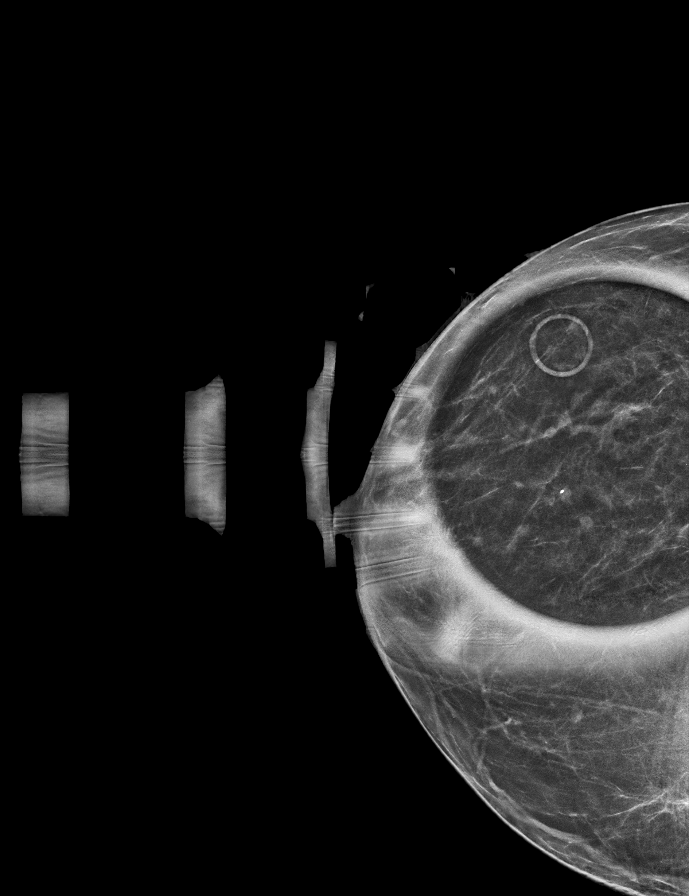

[L CC synth-2D (1 of 2)]
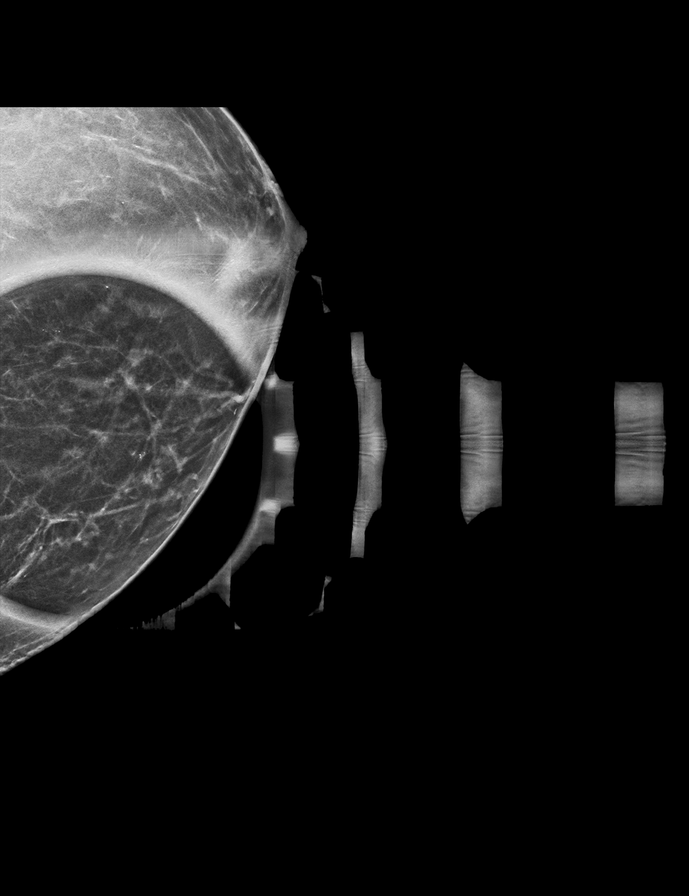

[L CC synth-2D (2 of 2)]
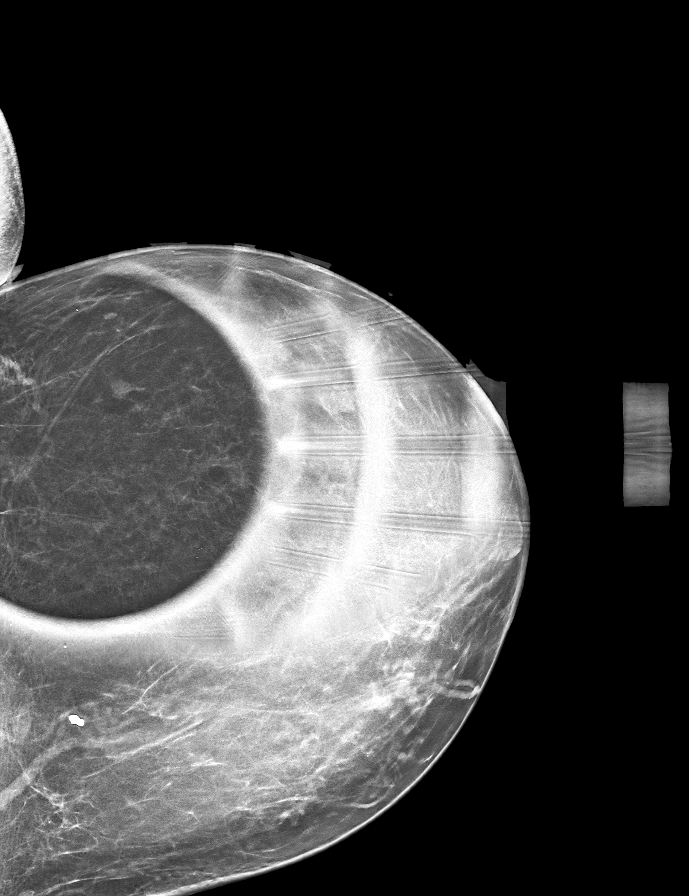

[R MLO synth-2D]
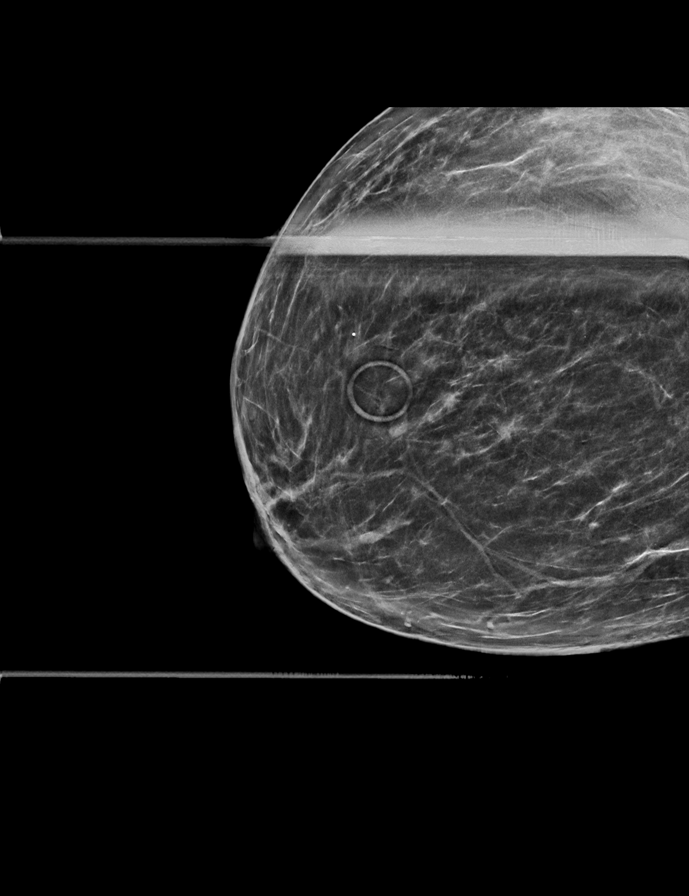

[L MLO synth-2D (1 of 3)]
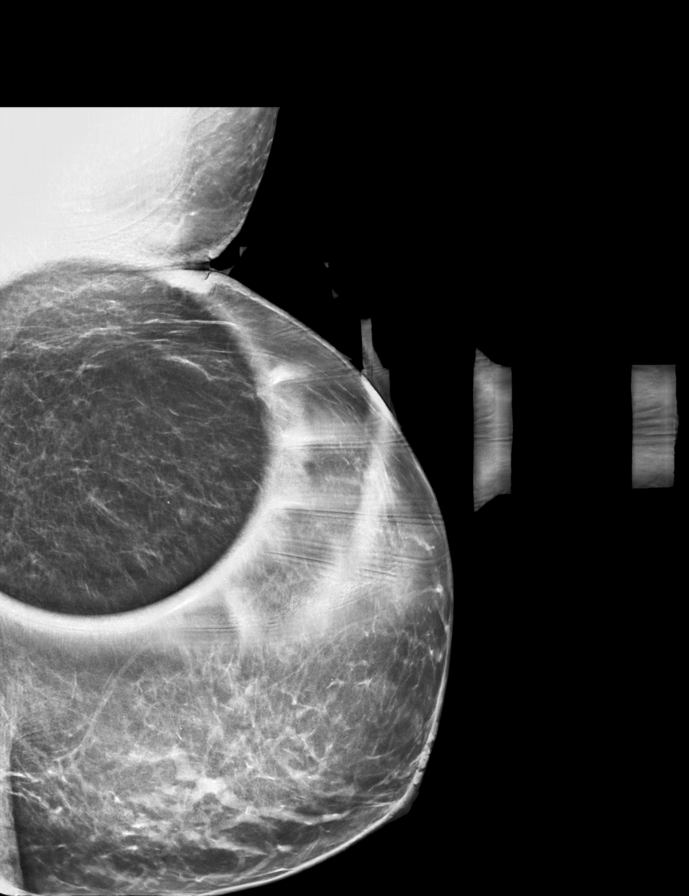

[L MLO synth-2D (2 of 3)]
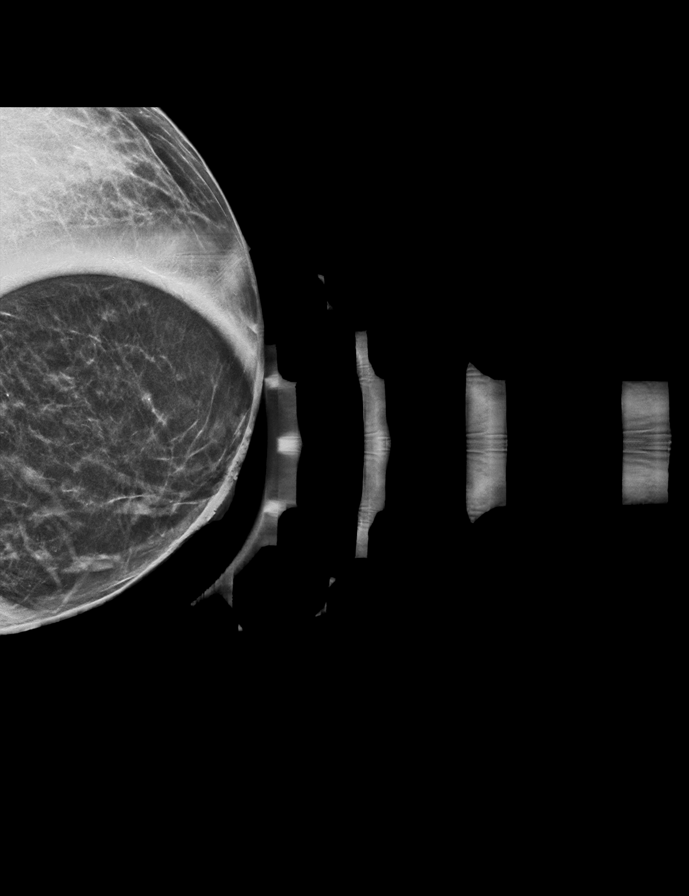

[L MLO synth-2D (3 of 3)]
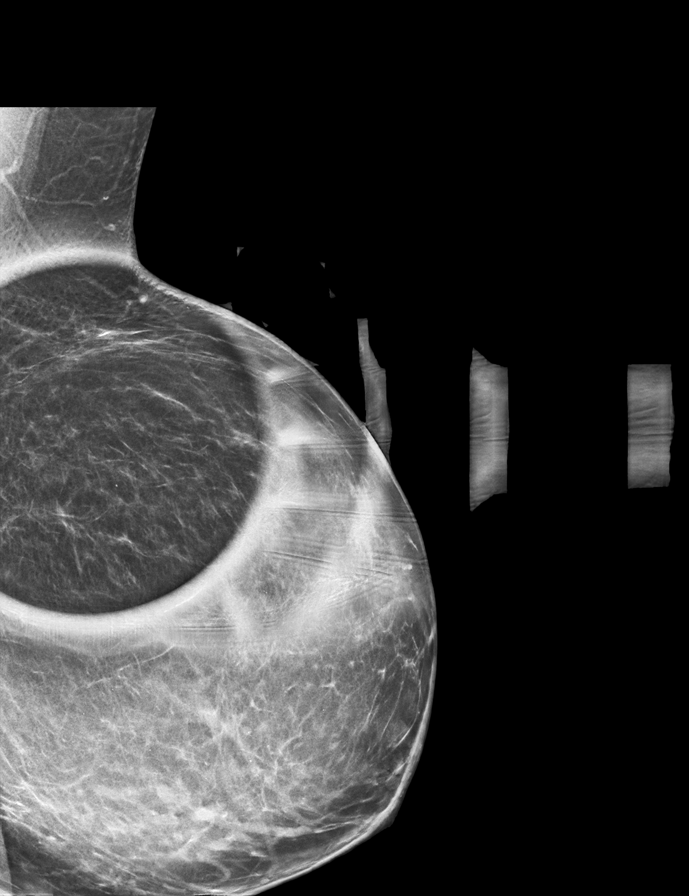

[L CC tomo · tomo slice 22/43.0]
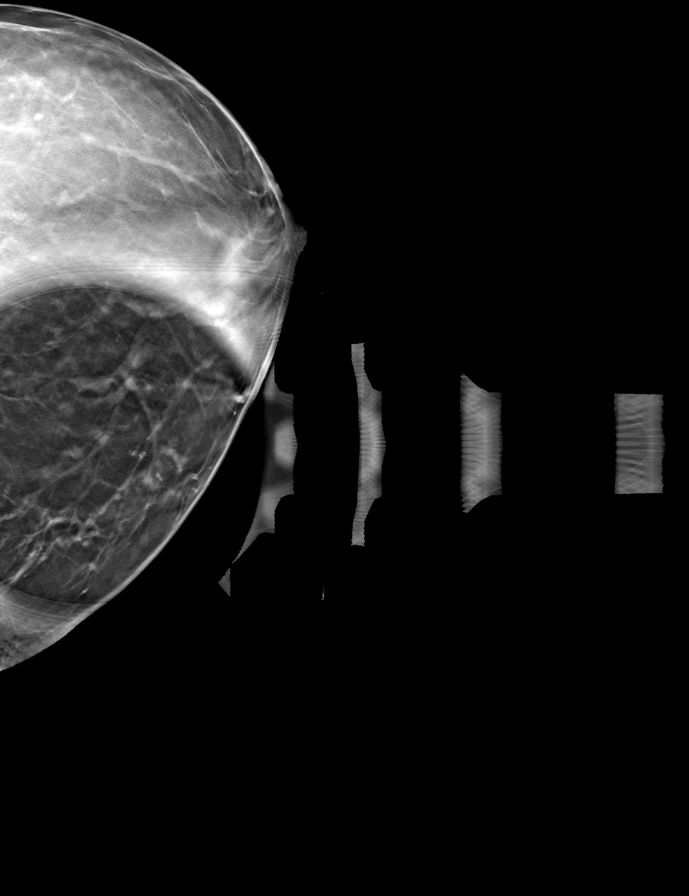

[8 of 40 positions shown; findings below may reference images not displayed]

ACR Breast Density Category b: There are scattered areas of
fibroglandular density.
FINDINGS: Additional imaging of both breast was performed. Asymmetry in the
upper-outer quadrant of the right breast and medial aspect of the
left breast have a similar appearance to prior films. There is a 6
mm mass in the upper-outer quadrant of the left breast, posterior
depth.

Targeted ultrasound is performed, showing mildly prominent ducts in
the 10 o'clock region of the right breast 4 cm from the nipple. No
intraductal mass is identified.

Targeted ultrasound is performed, showing mildly prominent ducts in
the 9 o'clock region of the left breast with no intraductal mass.
There is a 5 x 3 x 5 cm circumscribed mass in the 1 o'clock region
of the left breast 4 cm from the nipple. It is indeterminate.
Sonographic evaluation of the left axilla does not show any enlarged
adenopathy.
IMPRESSION: Indeterminate 5 mm mass in the 1 o'clock region of the left breast 4
cm from the nipple.

RECOMMENDATION:
Ultrasound-guided core biopsy of the mass in the 1 o'clock region of
the left breast, 4 cm from the nipple is recommended. The biopsy
will be scheduled at the patient's convenience.

I have discussed the findings and recommendations with the patient.
If applicable, a reminder letter will be sent to the patient
regarding the next appointment.

BI-RADS CATEGORY  4: Suspicious.

## 2022-02-04 IMAGING — US US BREAST BX W LOC DEV 1ST LESION IMG BX SPEC US GUIDE*L*
1 series · 12 of 17 positions shown · non-contrast
Comparison: Previous exam(s).
COMPARISON: Previous exam(s).

Addendum:
CLINICAL DATA: Suspicious 0.5 cm mass in the left breast 1 o'clock
position.

EXAM:
ULTRASOUND GUIDED LEFT BREAST CORE NEEDLE BIOPSY

[Series 1: us breast bx w loc dev 1st lesion img bx spec us g · 0.05mm/px · 12 of 17 slices shown]
[im 1/17]
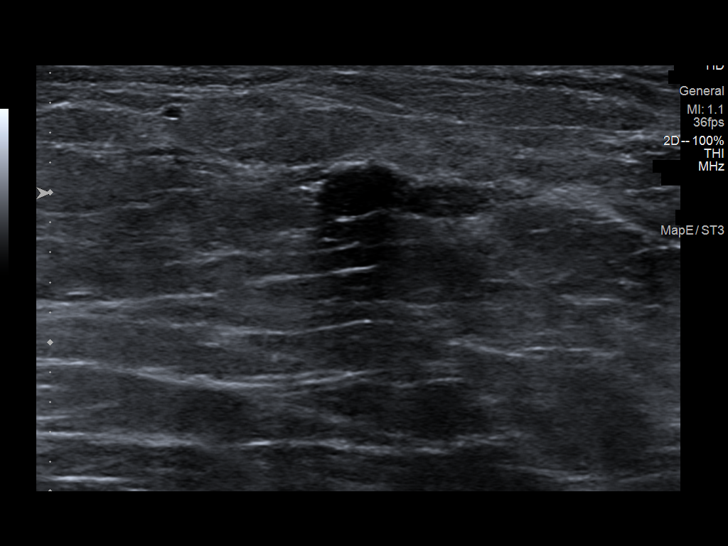
[im 3/17]
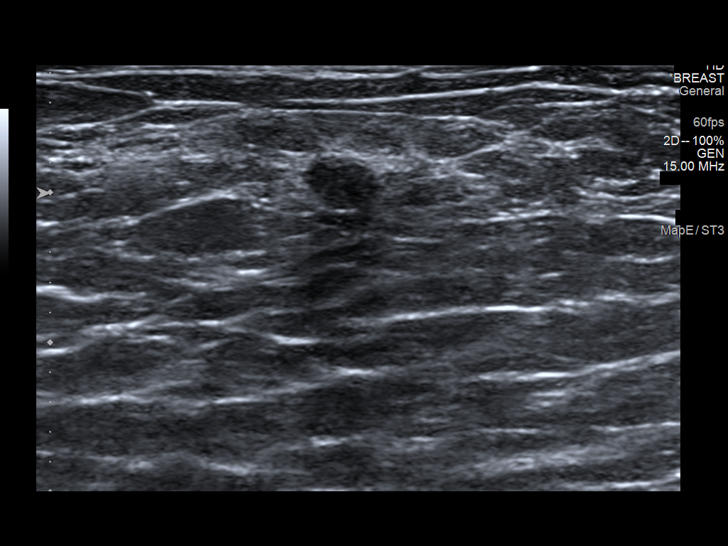
[im 4/17]
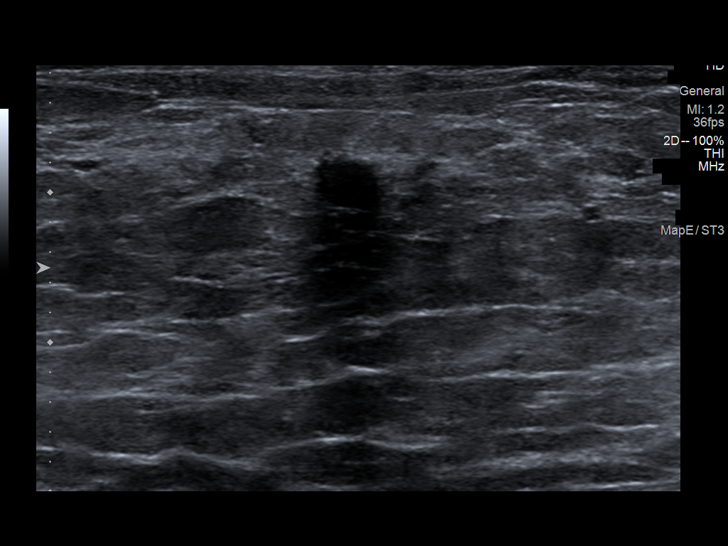
[im 5/17]
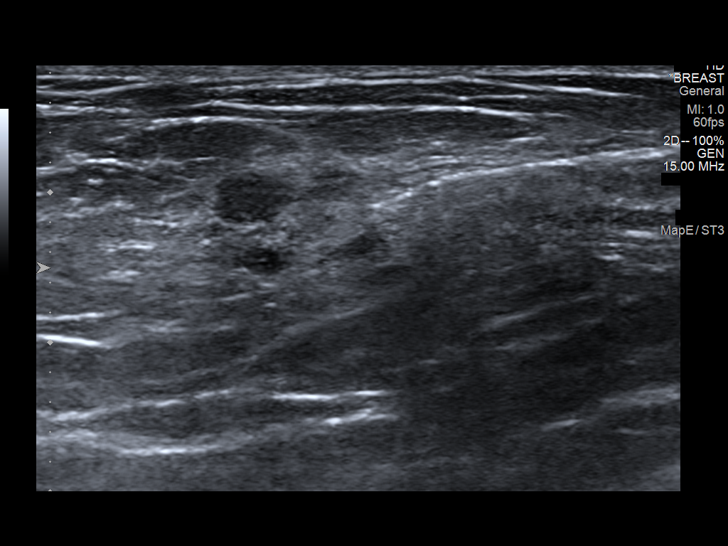
[im 7/17]
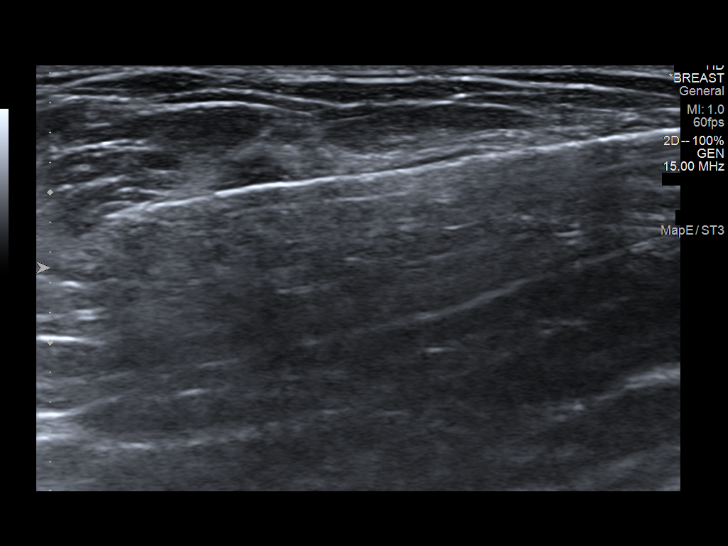
[im 8/17]
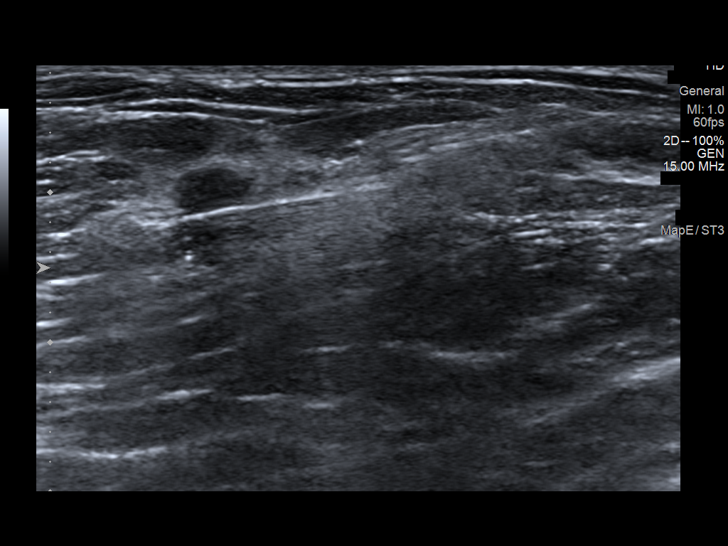
[im 10/17]
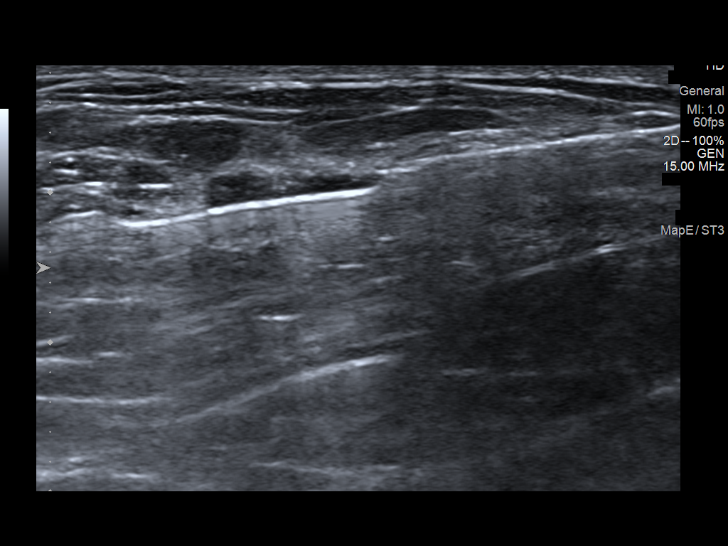
[im 11/17]
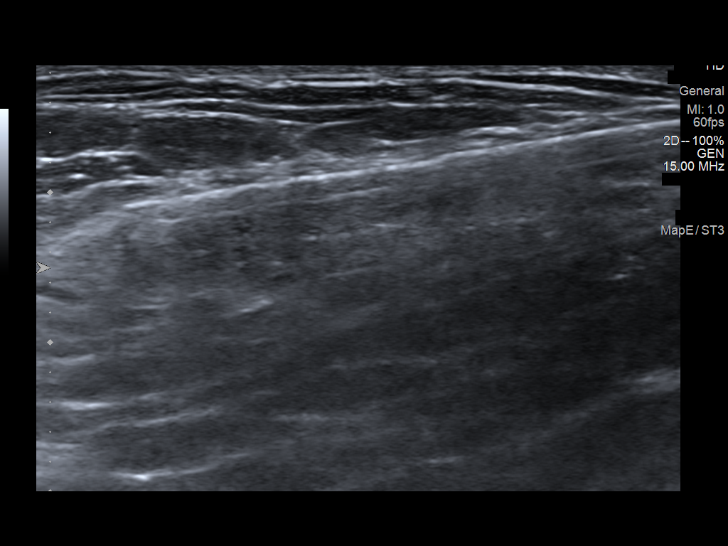
[im 13/17]
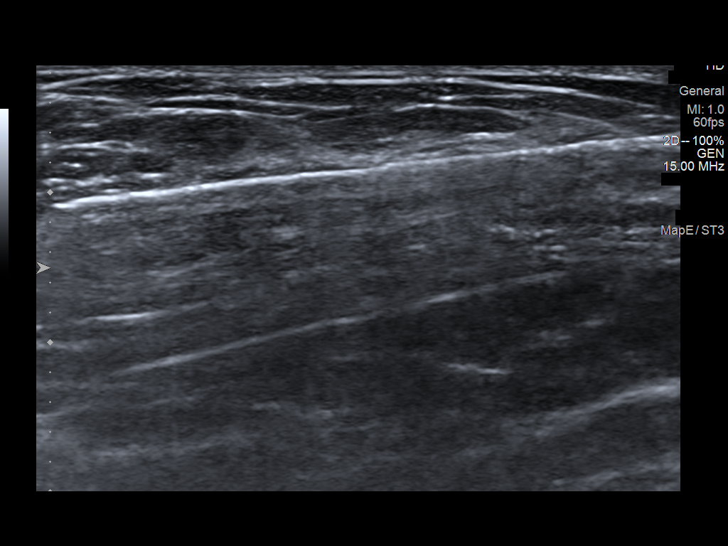
[im 14/17]
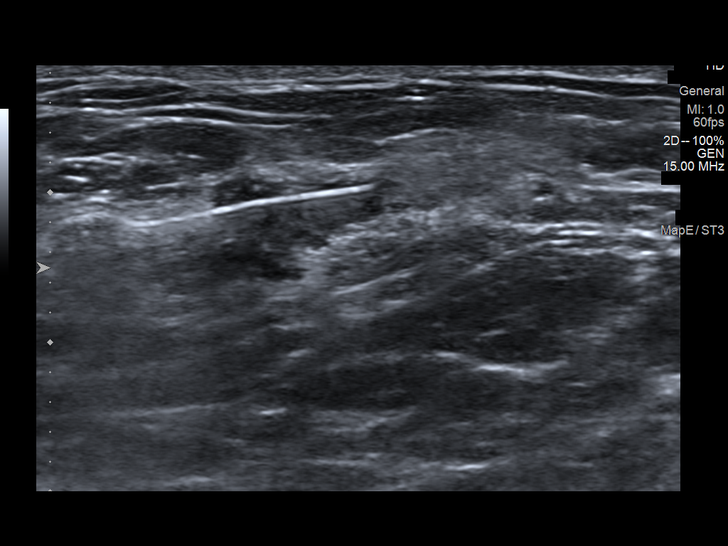
[im 15/17]
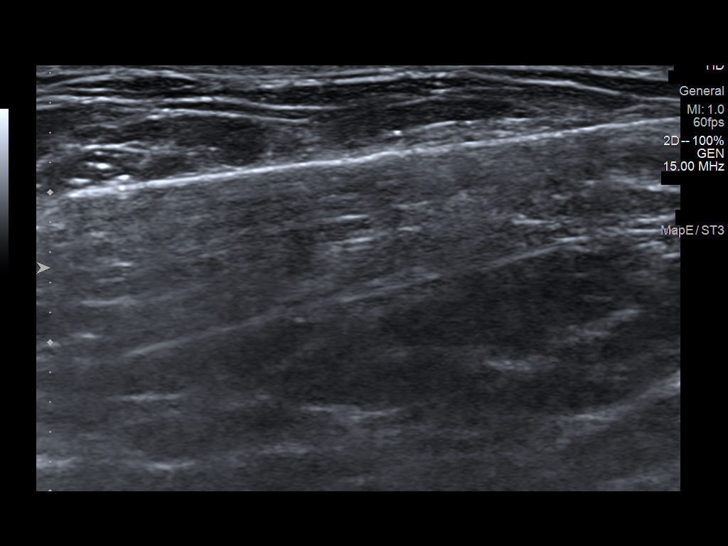
[im 17/17]
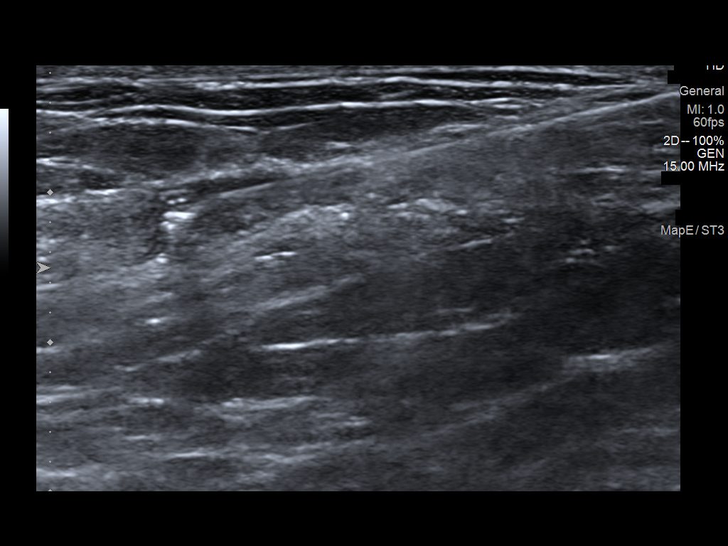

[12 of 17 positions shown; findings below may reference images not displayed]



Lesion quadrant: Upper outer quadrant

Using sterile technique and 1% Lidocaine as local anesthetic, under
direct ultrasound visualization, a 14 gauge Manga device was
used to perform biopsy of a 0.5 cm mass at the left breast 1 o'clock
position 4 cm from the nipple using a lateral approach. At the
conclusion of the procedure ribbon tissue marker clip was deployed
into the biopsy cavity. Follow up 2 view mammogram was performed and
dictated separately.
IMPRESSION: Ultrasound guided biopsy of mass at the left breast 1 o'clock
position. No apparent complications.

ADDENDUM:
Pathology revealed SCLEROTIC FIBROADENOMATOID NODULE of the Left
breast, upper outer, 1:00 o'clock, 9cmfn, (ribbon clip). This was
found to be concordant by Dr. Fausto Tucker.

Pathology results were discussed with the patient by telephone. The
patient reported doing well after the biopsy with minimal tenderness
at the site. Post biopsy instructions and care were reviewed and
questions were answered. The patient was encouraged to call The
direct phone number was provided.

The patient is scheduled for a Left breast stereotatic guided biopsy
on July 21, 2021 for suspicious mass in the left outer breast at
posterior depth (initially thought to correlate with the biopsied
ultrasound finding, but clearly separate from the biopsy site on
post-procedure mammogram). Further recommendations will be guided by
the results of this biopsy.

Pathology results reported by Alia Tiger, RN on 07/21/2021.



Lesion quadrant: Upper outer quadrant

Using sterile technique and 1% Lidocaine as local anesthetic, under
direct ultrasound visualization, a 14 gauge Manga device was
used to perform biopsy of a 0.5 cm mass at the left breast 1 o'clock
position 4 cm from the nipple using a lateral approach. At the
conclusion of the procedure ribbon tissue marker clip was deployed
into the biopsy cavity. Follow up 2 view mammogram was performed and
dictated separately.
IMPRESSION: Ultrasound guided biopsy of mass at the left breast 1 o'clock
position. No apparent complications.

## 2022-03-10 DIAGNOSIS — Z0130 Encounter for examination of blood pressure without abnormal findings: Secondary | ICD-10-CM | POA: Diagnosis not present

## 2022-05-03 ENCOUNTER — Encounter: Payer: Self-pay | Admitting: Gastroenterology

## 2022-05-23 ENCOUNTER — Other Ambulatory Visit: Payer: Self-pay | Admitting: Family Medicine

## 2022-05-23 DIAGNOSIS — Z1231 Encounter for screening mammogram for malignant neoplasm of breast: Secondary | ICD-10-CM

## 2022-07-06 ENCOUNTER — Encounter: Payer: Self-pay | Admitting: Gastroenterology

## 2022-07-10 DIAGNOSIS — E7800 Pure hypercholesterolemia, unspecified: Secondary | ICD-10-CM | POA: Diagnosis not present

## 2022-07-10 DIAGNOSIS — R7303 Prediabetes: Secondary | ICD-10-CM | POA: Diagnosis not present

## 2022-07-10 DIAGNOSIS — G720 Drug-induced myopathy: Secondary | ICD-10-CM | POA: Diagnosis not present

## 2022-07-10 DIAGNOSIS — I129 Hypertensive chronic kidney disease with stage 1 through stage 4 chronic kidney disease, or unspecified chronic kidney disease: Secondary | ICD-10-CM | POA: Diagnosis not present

## 2022-07-10 DIAGNOSIS — N183 Chronic kidney disease, stage 3 unspecified: Secondary | ICD-10-CM | POA: Diagnosis not present

## 2022-07-10 DIAGNOSIS — F39 Unspecified mood [affective] disorder: Secondary | ICD-10-CM | POA: Diagnosis not present

## 2022-07-12 ENCOUNTER — Ambulatory Visit
Admission: RE | Admit: 2022-07-12 | Discharge: 2022-07-12 | Disposition: A | Discharge status: Home / Self Care | Payer: Medicare Other | Source: Ambulatory Visit | Attending: Family Medicine | Admitting: Family Medicine

## 2022-07-12 DIAGNOSIS — Z1231 Encounter for screening mammogram for malignant neoplasm of breast: Secondary | ICD-10-CM

## 2022-07-14 ENCOUNTER — Ambulatory Visit (AMBULATORY_SURGERY_CENTER): Payer: Self-pay | Admitting: *Deleted

## 2022-07-14 VITALS — Ht 66.0 in | Wt 253.0 lb

## 2022-07-14 DIAGNOSIS — Z86010 Personal history of colonic polyps: Secondary | ICD-10-CM

## 2022-07-14 DIAGNOSIS — Z800 Family history of malignant neoplasm of digestive organs: Secondary | ICD-10-CM

## 2022-07-14 MED ORDER — NA SULFATE-K SULFATE-MG SULF 17.5-3.13-1.6 GM/177ML PO SOLN: 1.0000 | ORAL | 0 refills | Status: DC

## 2022-07-14 NOTE — Progress Notes (Signed)
Patient is here in-person for PV. Patient denies any allergies to eggs or soy. Patient denies any problems with anesthesia/sedation. Patient is not on any oxygen at home. Patient is not taking any diet/weight loss medications or blood thinners. Went over procedure prep instructions with the patient. Patient is aware of our care-partner policy. Patient notified to use [employer] card given to pt  for prescription.

## 2022-07-19 ENCOUNTER — Encounter (INDEPENDENT_AMBULATORY_CARE_PROVIDER_SITE_OTHER): Payer: Self-pay

## 2022-08-02 ENCOUNTER — Encounter: Payer: Medicare Other | Admitting: Gastroenterology

## 2022-08-04 DIAGNOSIS — L918 Other hypertrophic disorders of the skin: Secondary | ICD-10-CM | POA: Diagnosis not present

## 2022-08-04 DIAGNOSIS — L738 Other specified follicular disorders: Secondary | ICD-10-CM | POA: Diagnosis not present

## 2022-08-04 DIAGNOSIS — Z85828 Personal history of other malignant neoplasm of skin: Secondary | ICD-10-CM | POA: Diagnosis not present

## 2022-08-04 DIAGNOSIS — D1801 Hemangioma of skin and subcutaneous tissue: Secondary | ICD-10-CM | POA: Diagnosis not present

## 2022-08-04 DIAGNOSIS — D225 Melanocytic nevi of trunk: Secondary | ICD-10-CM | POA: Diagnosis not present

## 2022-08-04 DIAGNOSIS — L821 Other seborrheic keratosis: Secondary | ICD-10-CM | POA: Diagnosis not present

## 2022-08-08 DIAGNOSIS — N183 Chronic kidney disease, stage 3 unspecified: Secondary | ICD-10-CM | POA: Diagnosis not present

## 2022-08-25 ENCOUNTER — Encounter: Payer: Medicare Other | Admitting: Gastroenterology

## 2022-08-28 DIAGNOSIS — N179 Acute kidney failure, unspecified: Secondary | ICD-10-CM | POA: Diagnosis not present

## 2022-10-25 ENCOUNTER — Encounter: Payer: Self-pay | Admitting: Cardiovascular Disease

## 2022-10-25 ENCOUNTER — Ambulatory Visit: Payer: Medicare Other | Attending: Cardiovascular Disease | Admitting: Cardiovascular Disease

## 2022-10-25 VITALS — BP 137/89 | HR 74 | Ht 66.0 in | Wt 247.4 lb

## 2022-10-25 DIAGNOSIS — I10 Essential (primary) hypertension: Secondary | ICD-10-CM | POA: Insufficient documentation

## 2022-10-25 DIAGNOSIS — E782 Mixed hyperlipidemia: Secondary | ICD-10-CM

## 2022-10-25 DIAGNOSIS — R0609 Other forms of dyspnea: Secondary | ICD-10-CM

## 2022-10-25 DIAGNOSIS — Z79899 Other long term (current) drug therapy: Secondary | ICD-10-CM | POA: Insufficient documentation

## 2022-10-25 MED ORDER — HYDROCHLOROTHIAZIDE 12.5 MG PO CAPS: 12.5000 mg | ORAL_CAPSULE | ORAL | 3 refills | Status: DC

## 2022-10-25 NOTE — Patient Instructions (Signed)
Medication Instructions:  START: HYDROCHLOROTHIAZIDE 12.'5mg'$  EVERY OTHER DAY  *If you need a refill on your cardiac medications before your next appointment, please call your pharmacy*  Lab Work: Please return for Blood Work in [place]. No appointment needed, lab here at the office is open Monday-Friday from 8AM to 4PM and closed daily for lunch from 12:45-1:45.   ALSO- OKAY TO HAVE DONE AT YOUR PRIMARY CARE OFFICE  If you have labs (blood work) drawn today and your tests are completely normal, you will receive your results only by: [place] (if you have MyChart) OR A paper copy in the mail If you have any lab test that is abnormal or we need to change your treatment, we will call you to review the results.  Testing/Procedures: None Ordered At This Time.   Follow-Up: At [facility], you and your health needs are our priority.  As part of our continuing mission to provide you with exceptional heart care, we have created designated Provider Care Teams.  These Care Teams include your primary Cardiologist (physician) and Advanced Practice Providers (APPs -  Physician Assistants and Nurse Practitioners) who all work together to provide you with the care you need, when you need it.  Your next appointment:   1 year(s)  The format for your next appointment:   In Person  Provider:   Sanda Klein, MD

## 2022-10-25 NOTE — Progress Notes (Signed)
Cardiology Office Note    Date:  10/25/2022   ID:  [name], DOB 1946/08/28, MRN [id number]  PCP:  Collene Leyden, MD  Cardiologist:   Sanda Klein, MD   Chief Complaint  Patient presents with   Hypertension    History of Present Illness:  [name] is a 76 y.o. female with obesity and associated hypertension and hyperlipidemia returning for follow-up.   Uneventful year from a cardiac point of view.  Frequently has elevated blood pressure at home, but I do not think her cuff is the appropriate size.  When she first checked into the office today her blood pressure with the small cuff was 152/92.  When I rechecked it with a large cuff it was 137/89.  Biggest complaints are related to pain in her left knee.  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  Has had some relief by applying compresses to the knee.  The patient specifically denies any chest pain at rest or with exertion, dyspnea at rest or with usual exertion, orthopnea, paroxysmal nocturnal dyspnea, syncope, palpitations, focal neurological deficits, intermittent claudication, lower extremity edema, unexplained weight gain, cough, hemoptysis or wheezing.  Continues to have mild NYHA functional class II dyspnea which is attributable to her weight.  She was intolerant of amlodipine due to ankle swelling and has a history of cough with ACE inhibitors and diarrhea with valsartan and losartan.  Her renal function deteriorated slightly when she took triamterene-hydrochlorothiazide.  Currently her only antihypertensive medication is carvedilol.    As before, she has borderline elevated LDL cholesterol at 109, but she does not have known CAD or PAD.  She has borderline low HDL and mildly elevated triglycerides.  She does not have diabetes mellitus and hemoglobin A1c is actually excellent at 5.1%.  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  In the past, she has periods of dyspnea that had clear association with gastroesophageal reflux.   She had a normal chest CT angiogram for presumed pulmonary embolism and a normal nuclear stress test in June 2018.  The chest CT, while not a coronary angiogram study, was remarkable for the absence of coronary or aortic calcification.  She had a normal nuclear stress test in 2018.  She had an echocardiogram in September 2019 that showed normal left ventricular systolic function with mild diastolic dysfunction.  She has a strong family history of coronary disease and a personal history of hypertension and hyperlipidemia and borderline diabetes mellitus.  She underwent cardiac catheterization in 2007 with angiographically patent coronary arteries without meaningful atherosclerosis. She has been intolerant to statin medications and fibrates due to severe myalgia.    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   Swelling    Vitamin D deficiency     Past Surgical History:  Procedure Laterality Date   ABDOMINAL HYSTERECTOMY  2001   [place]  CARDIAC CATHETERIZATION  2007   Patent coronary arteries   COLONOSCOPY  2020   Dr.Nandigam   KNEE ARTHROSCOPY Right 1999   LAPAROSCOPIC CHOLECYSTECTOMY  2007   POLYPECTOMY     TONSILECTOMY/ADENOIDECTOMY WITH MYRINGOTOMY  1963   TOTAL KNEE ARTHROPLASTY Right 07/21/2016   Procedure: RIGHT TOTAL KNEE ARTHROPLASTY;  Surgeon: Netta Cedars, MD;  Location: [facility];  Service: Orthopedics;  Laterality: Right;   TOTAL KNEE ARTHROPLASTY     right    Current  Medications: Outpatient Medications Prior to Visit  Medication Sig Dispense Refill   aspirin EC 81 MG tablet Take 81 mg by mouth daily. Swallow whole.     carvedilol (COREG) 6.25 MG tablet TAKE 1 TABLET BY MOUTH TWICE A DAY 180 tablet 3   diphenhydrAMINE (BENADRYL) 25 mg capsule Take 25 mg by mouth daily.     ELDERBERRY PO Take by mouth.     famotidine (PEPCID) 40 MG tablet Take 40 mg by mouth at bedtime.     FLAXSEED, LINSEED, PO Take 1,000 mg by mouth daily.     senna-docusate (SENOKOT-S) 8.6-50 MG tablet Take 2 tablets by mouth 2 (two) times daily. Until stooling regularly 60 tablet 0   sertraline (ZOLOFT) 50 MG tablet Take 25 mg by mouth daily.     Turmeric Curcumin 500 MG CAPS Take 1 capsule by mouth daily.     vitamin C (ASCORBIC ACID) 500 MG tablet Take 500 mg by mouth daily.     Na Sulfate-K Sulfate-Mg Sulf 17.5-3.13-1.6 GM/177ML SOLN Take 1 kit by mouth as directed. May use generic SUPREP;NO prior authorizations will be done.Please use Singlecare or GOOD-RX coupon. (Patient not taking: Reported on 10/25/2022) 354 mL 0   pantoprazole (PROTONIX) 40 MG tablet Take 1 tablet (40 mg total) by mouth daily. (Patient not taking: Reported on 10/25/2022) 90 tablet 3   polyethylene glycol (MIRALAX / GLYCOLAX) 17 g packet Take 17 g by mouth 2 (two) times daily. Until stooling regularly (Patient not taking: Reported on 10/25/2022) 60 packet 0   triamterene-hydrochlorothiazide (MAXZIDE-25) 37.5-25 MG tablet Take 1 tablet by mouth daily. (Patient not taking: Reported on 10/25/2022) 30 tablet 11   No facility-administered medications prior to visit.     Allergies:   Statins, Ace inhibitors, Losartan, Tricor [fenofibrate], and Penicillins   Social History   Socioeconomic History   Marital status: Married    Spouse name: Not on file   Number of children: 1   Years of education: 16   Highest education level: Bachelor's degree (e.g., BA, AB, BS)  Occupational History   Occupation: Retired   Tobacco Use   Smoking status: Never   Smokeless tobacco: Never  Vaping Use   Vaping Use: Never used  Substance and Sexual Activity   Alcohol use: No   Drug use: No   Sexual activity: Not on file  Other Topics Concern   Not on file  Social History Narrative   Lives at home with her husband.   Right-handed.   1-2 cups caffeine per day.   Social Determinants of Health   Financial Resource Strain: Not on file  Food Insecurity: Not on file  Transportation Needs: Not on file  Physical Activity: Not on file  Stress: Not on file  Social Connections: Not on file     Family History:  The patient's family history includes Anxiety disorder in her father; Colon cancer (age of onset: 64) in her sister; Colon cancer (age of onset: 45) in her father; Colon polyps  in her brother; Dementia in her mother; Depression in her father and mother; Heart disease (age of onset: 44) in her mother; Heart failure in her father; Hyperlipidemia in her mother; Hypertension in her father and mother; Prostate cancer in her father; Sudden death (age of onset: 85) in her paternal grandfather; Sudden death (age of onset: 38) in her maternal grandfather; Thyroid disease in her mother.   ROS:   Please see the history of present illness.    ROS all other systems are reviewed and are negative  PHYSICAL EXAM:   VS:  BP 137/89   Pulse 74   Ht _0  (1.676 m)   Wt 112.2 kg   SpO2 94%   BMI 39.93 kg/m      General: Alert, oriented x3, no distress, severely obese Head: no evidence of trauma, PERRL, EOMI, no exophtalmos or lid lag, no myxedema, no xanthelasma; normal ears, nose and oropharynx Neck: normal jugular venous pulsations and no hepatojugular reflux; brisk carotid pulses without delay and no carotid bruits Chest: clear to auscultation, no signs of consolidation by percussion or palpation, normal fremitus, symmetrical and full respiratory excursions Cardiovascular: normal position and quality of the apical  impulse, regular rhythm, normal first and second heart sounds, no murmurs, rubs or gallops Abdomen: no tenderness or distention, no masses by palpation, no abnormal pulsatility or arterial bruits, normal bowel sounds, no hepatosplenomegaly Extremities: no clubbing, cyanosis or edema; 2+ radial, ulnar and brachial pulses bilaterally; 2+ right femoral, posterior tibial and dorsalis pedis pulses; 2+ left femoral, posterior tibial and dorsalis pedis pulses; no subclavian or femoral bruits Neurological: grossly nonfocal Psych: Normal mood and affect     Wt Readings from Last 3 Encounters:  10/25/22 112.2 kg  07/14/22 114.8 kg  10/19/21 112.9 kg      Studies/Labs Reviewed:   ECHO 08/28/2018: - Left ventricle: The cavity size was normal. Wall thickness was    increased in a pattern of moderate LVH. Systolic function was    normal. The estimated ejection fraction was in the range of 60%    to 65%. Wall motion was normal; there were no regional wall    motion abnormalities. Doppler parameters are consistent with    abnormal left ventricular relaxation (grade 1 diastolic    dysfunction). The E/e&' ratio is between 8-15, suggesting    indeterminate LV filling pressure.  - Mitral valve: Mildly thickened leaflets . There was trivial    regurgitation.  - Left atrium: The atrium was normal in size.  - Tricuspid valve: There was trivial regurgitation.  - Pulmonary arteries: PA peak pressure: 27 mm Hg (S).  - Inferior vena cava: The vessel was normal in size. The    respirophasic diameter changes were in the normal range (>= 50%),    consistent with normal central venous pressure.   Impressions:   - LVEF 60-65%, moderate LVH, normal wall motion, grade 1 DD,    indeterminate LV filling pressure, trivial MR, norma LA size,    trivial TR, RVSP 27 mmHg, normal IVC.   EKG:  EKG is ordered today.  It shows normal sinus rhythm and is a normal tracing.  QTc 410 ms  Recent Labs: Dr. Laurann [place],  06/25/2019 Total cholesterol 208, LDL 123, HDL 48, triglycerides of 91 Hemoglobin A1c 5.3%, creatinine 0.88, potassium 4.5, normal liver function tests  09/02/2020 Total cholesterol 199, HDL 45, LDL 119, triglycerides 202 Hemoglobin A1c 5.4%, creatinine 0.91, potassium 4.5, normal liver function tests and normal TSH  07/04/2021  Cholesterol 190, HDL 39, LDL 117, triglycerides 196 Hemoglobin A1c 5.9%, creatinine 0.97, potassium 4.5, normal liver function tests and normal TSH  07/10/2022 Cholesterol 192, HDL 38, LDL 109, triglycerides 255 Hemoglobin A1c 5.1%, creatinine 1.11, potassium 4.5, ALT 10, TSH 1.2 and 0  ASSESSMENT:    1. Essential hypertension   2. Medication management   3. Mixed hyperlipidemia   4. Severe obesity (BMI 35.0-39.9) with comorbidity ([place])   5. Exertional dyspnea      PLAN:  In order of problems listed above:  HTN: Fatigue and sleepiness with carvedilol at higher doses.  Intolerance to ACE inhibitors and ARB and calcium channel blockers due to different side effects.  Worsening renal function with triamterene.  We will start just a very low-dose of hydrochlorothiazide, prescribed every other day.  Target BP 130/80.  We will cut the dose of carvedilol in half and start triamterene hydrochlorothiazide.  Had issues with calcium channel blockers, ACE inhibitors and ARB. HLP: Thankfully she does not have CAD or PAD or DM.  LDL is slightly higher than acceptable, HDL and triglycerides are also borderline.  History of statin myopathy with multiple agents.  Continue with lifestyle changes.  Discussed the concept of the glycemic index in detail.  Her weakness is positive.  Try to see if she likes whole-grain [place]. Severe obesity: She is lost a few pounds and is no longer morbidly obese range.  She has lifelong frustration with attempts at weight loss. Exertional dyspnea: Previous cardiac work-up with echocardiography and coronary angiography as well as CT of the chest  with contrast have not shown abnormalities that could be implicated.  Clearly her weight is part of the problem.  Note relationship with symptoms of GERD in the past.  It is reasonable to consider a pulmonary evaluation.    Medication Adjustments/Labs and Tests Ordered: Current medicines are reviewed at length with the patient today.  Concerns regarding medicines are outlined above.  Medication changes, Labs and Tests ordered today are listed in the Patient Instructions below. Patient Instructions  Medication Instructions:  START: HYDROCHLOROTHIAZIDE 12.56m EVERY OTHER DAY  *If you need a refill on your cardiac medications before your next appointment, please call your pharmacy*  Lab Work: Please return for Blood Work in [place] No appointment needed, lab here at the office is open Monday-Friday from 8AM to 4PM and closed daily for lunch from 12:45-1:45.   ALSO- OKAY TO HAVE DONE AT YOUR PRIMARY CARE OFFICE  If you have labs (blood work) drawn today and your tests are completely normal, you will receive your results only by: [place](if you have MyChart) OR A paper copy in the mail If you have any lab test that is abnormal or we need to change your treatment, we will call you to review the results.  Testing/Procedures: None Ordered At This Time.   Follow-Up: [facility] you and your health needs are our priority.  As part of our continuing mission to provide you with exceptional heart care, we have created designated Provider Care Teams.  These Care Teams include your primary Cardiologist (physician) and Advanced Practice Providers (APPs -  Physician Assistants and Nurse Practitioners) who all work together to provide you with the care you need, when you need it.  Your next appointment:   1 year(s)  The format for your next appointment:   In Person  Provider:   MSanda Klein MD  Signed, Sanda Klein, MD  10/25/2022 11:31 AM    [facility] [place], [place], [place]  [address] Phone: [phone]; Fax: [phone]

## 2023-01-03 ENCOUNTER — Other Ambulatory Visit: Payer: Self-pay | Admitting: Cardiovascular Disease

## 2023-01-19 ENCOUNTER — Other Ambulatory Visit: Payer: Self-pay | Admitting: Family Medicine

## 2023-01-19 DIAGNOSIS — M8588 Other specified disorders of bone density and structure, other site: Secondary | ICD-10-CM

## 2023-01-19 DIAGNOSIS — Z1211 Encounter for screening for malignant neoplasm of colon: Secondary | ICD-10-CM | POA: Diagnosis not present

## 2023-01-19 DIAGNOSIS — N183 Chronic kidney disease, stage 3 unspecified: Secondary | ICD-10-CM | POA: Diagnosis not present

## 2023-01-19 DIAGNOSIS — R7303 Prediabetes: Secondary | ICD-10-CM | POA: Diagnosis not present

## 2023-01-19 DIAGNOSIS — E559 Vitamin D deficiency, unspecified: Secondary | ICD-10-CM | POA: Diagnosis not present

## 2023-01-19 DIAGNOSIS — G720 Drug-induced myopathy: Secondary | ICD-10-CM | POA: Diagnosis not present

## 2023-01-19 DIAGNOSIS — K219 Gastro-esophageal reflux disease without esophagitis: Secondary | ICD-10-CM | POA: Diagnosis not present

## 2023-01-19 DIAGNOSIS — F39 Unspecified mood [affective] disorder: Secondary | ICD-10-CM | POA: Diagnosis not present

## 2023-01-19 DIAGNOSIS — Z0000 Encounter for general adult medical examination without abnormal findings: Secondary | ICD-10-CM | POA: Diagnosis not present

## 2023-01-19 DIAGNOSIS — E7800 Pure hypercholesterolemia, unspecified: Secondary | ICD-10-CM | POA: Diagnosis not present

## 2023-01-19 DIAGNOSIS — I129 Hypertensive chronic kidney disease with stage 1 through stage 4 chronic kidney disease, or unspecified chronic kidney disease: Secondary | ICD-10-CM | POA: Diagnosis not present

## 2023-02-27 ENCOUNTER — Encounter: Payer: Self-pay | Admitting: Gastroenterology

## 2023-02-28 ENCOUNTER — Telehealth: Payer: Self-pay | Admitting: *Deleted

## 2023-02-28 NOTE — Telephone Encounter (Signed)
Pt.scheduled for recall colon on 04/12/23,last OV 05/09/19,please review chart and advise if pt. Need OV or proceed with scheduled procedure?

## 2023-03-06 NOTE — Telephone Encounter (Signed)
Noted  

## 2023-03-06 NOTE — Telephone Encounter (Signed)
Please schedule direct procedure for surveillance colonoscopy if patient is asymptomatic, she does not need office visit.

## 2023-03-14 DIAGNOSIS — H52203 Unspecified astigmatism, bilateral: Secondary | ICD-10-CM | POA: Diagnosis not present

## 2023-03-14 DIAGNOSIS — H524 Presbyopia: Secondary | ICD-10-CM | POA: Diagnosis not present

## 2023-03-14 DIAGNOSIS — Z961 Presence of intraocular lens: Secondary | ICD-10-CM | POA: Diagnosis not present

## 2023-03-16 ENCOUNTER — Ambulatory Visit (AMBULATORY_SURGERY_CENTER): Payer: Medicare Other | Admitting: *Deleted

## 2023-03-16 VITALS — Ht 66.0 in | Wt 246.0 lb

## 2023-03-16 DIAGNOSIS — Z800 Family history of malignant neoplasm of digestive organs: Secondary | ICD-10-CM

## 2023-03-16 DIAGNOSIS — Z86010 Personal history of colonic polyps: Secondary | ICD-10-CM

## 2023-03-16 NOTE — Progress Notes (Signed)
No egg or soy allergy known to patient  No issues known to pt with past sedation with any surgeries or procedures Patient denies ever being told they had issues or difficulty with intubation  No FH of Malignant Hyperthermia Pt is not on diet pills Pt is not on  home 02  Pt is not on blood thinners  Pt denies issues with constipation  No A fib or A flutter Have any cardiac testing pending--NO Pt instructed to use Singlecare.com or GoodRx for a price reduction on prep    PT.ALREADY HAD SUPREP FROM PREVIOUS APPOINTMENT  Patient's chart reviewed by [name] CNRA prior to previsit and patient appropriate for the [facility].  Previsit completed and red dot placed by patient's name on their procedure day (on provider's schedule).

## 2023-03-26 DIAGNOSIS — E7800 Pure hypercholesterolemia, unspecified: Secondary | ICD-10-CM | POA: Diagnosis not present

## 2023-04-08 ENCOUNTER — Encounter: Payer: Self-pay | Admitting: Certified Registered Nurse Anesthetist

## 2023-04-12 ENCOUNTER — Encounter: Payer: Self-pay | Admitting: Gastroenterology

## 2023-04-12 ENCOUNTER — Ambulatory Visit (AMBULATORY_SURGERY_CENTER): Payer: Medicare Other | Admitting: Gastroenterology

## 2023-04-12 VITALS — BP 134/61 | HR 62 | Temp 98.6°F | Resp 14 | Ht 66.0 in | Wt 246.0 lb

## 2023-04-12 DIAGNOSIS — N183 Chronic kidney disease, stage 3 unspecified: Secondary | ICD-10-CM | POA: Diagnosis not present

## 2023-04-12 DIAGNOSIS — E669 Obesity, unspecified: Secondary | ICD-10-CM | POA: Diagnosis not present

## 2023-04-12 DIAGNOSIS — Z86010 Personal history of colonic polyps: Secondary | ICD-10-CM | POA: Diagnosis not present

## 2023-04-12 DIAGNOSIS — Z800 Family history of malignant neoplasm of digestive organs: Secondary | ICD-10-CM | POA: Diagnosis not present

## 2023-04-12 DIAGNOSIS — K635 Polyp of colon: Secondary | ICD-10-CM | POA: Diagnosis not present

## 2023-04-12 DIAGNOSIS — D120 Benign neoplasm of cecum: Secondary | ICD-10-CM

## 2023-04-12 DIAGNOSIS — Z789 Other specified health status: Secondary | ICD-10-CM | POA: Insufficient documentation

## 2023-04-12 DIAGNOSIS — I10 Essential (primary) hypertension: Secondary | ICD-10-CM | POA: Diagnosis not present

## 2023-04-12 DIAGNOSIS — Z09 Encounter for follow-up examination after completed treatment for conditions other than malignant neoplasm: Secondary | ICD-10-CM | POA: Diagnosis not present

## 2023-04-12 DIAGNOSIS — F419 Anxiety disorder, unspecified: Secondary | ICD-10-CM | POA: Diagnosis not present

## 2023-04-12 MED ORDER — SODIUM CHLORIDE 0.9 % IV SOLN: 500.0000 mL | INTRAVENOUS | Status: DC

## 2023-04-12 NOTE — Progress Notes (Signed)
[facility] History and Physical   Primary Care Physician:  Irven Coe, MD   Reason for Procedure:  History of adenomatous colon polyps, family h/o colon cancer  Plan:    Surveillance colonoscopy with possible interventions as needed     HPI: [name] is a very pleasant 77 y.o. female here for surveillance colonoscopy. Denies any nausea, vomiting, abdominal pain, melena or bright red blood per rectum  The risks and benefits as well as alternatives of endoscopic procedure(s) have been discussed and reviewed. All questions answered. The patient agrees to proceed.    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   Swelling    Vitamin D deficiency     Past Surgical History:  Procedure Laterality Date   ABDOMINAL HYSTERECTOMY  2001   APPENDECTOMY  1957   CARDIAC CATHETERIZATION  2007   Patent coronary arteries   CATARACTS Bilateral    COLONOSCOPY  2020   Dr.Travus Oren   KNEE ARTHROSCOPY Right 1999   LAPAROSCOPIC CHOLECYSTECTOMY  2007   POLYPECTOMY     TONSILECTOMY/ADENOIDECTOMY WITH MYRINGOTOMY  1963   TOTAL KNEE ARTHROPLASTY Right 07/21/2016   Procedure: RIGHT TOTAL KNEE ARTHROPLASTY;  Surgeon: Beverely Low, MD;  Location: [facility] OR;  Service: Orthopedics;  Laterality: Right;   TOTAL KNEE ARTHROPLASTY     right    Prior to Admission medications   Medication Sig Start Date End Date Taking? Authorizing Provider  aspirin EC 81 MG tablet Take 81 mg by mouth daily. Swallow whole.    [provider]  carvedilol  (COREG) 6.25 MG tablet Take 1 tablet (6.25 mg total) by mouth 2 (two) times daily. 01/04/23   Croitoru, Mihai, MD  diphenhydrAMINE (BENADRYL) 25 mg capsule Take 25 mg by mouth daily.    [provider]  ELDERBERRY PO Take by mouth daily.    [provider]  famotidine (PEPCID) 40 MG tablet Take 40 mg by mouth as needed. 08/11/22   [provider]  FLAXSEED, LINSEED, PO Take 1,000 mg by mouth daily.    [provider]  hydrochlorothiazide (MICROZIDE) 12.5 MG capsule Take 1 capsule (12.5 mg total) by mouth every other day. 10/25/22   Croitoru, Rachelle Hora, MD  Boris Lown Oil (OMEGA-3) 500 MG CAPS Take by mouth. 07/26/17   [provider]  Milk Thistle 1000 MG CAPS once capsule Orally once a day    [provider]  Na Sulfate-K Sulfate-Mg Sulf 17.5-3.13-1.6 GM/177ML SOLN Take 1 kit by mouth as directed. May use generic SUPREP;NO prior authorizations will be done.Please use Singlecare or GOOD-RX coupon. 07/14/22   Napoleon Form, MD  OVER THE COUNTER MEDICATION daily. LIVER HELP TAKE ONE DAILY    [provider]  pantoprazole (PROTONIX) 40 MG tablet Take 1 tablet (40 mg total) by mouth daily. Patient not taking: Reported on 10/25/2022 05/09/19   Napoleon Form, MD  polyethylene glycol (MIRALAX / GLYCOLAX) 17 g packet Take 17 g by mouth 2 (two) times daily. Until stooling regularly 12/27/20   Opalski, Gavin Pound, DO  senna-docusate (SENOKOT-S) 8.6-50 MG tablet Take 2 tablets by mouth 2 (two) times daily. Until stooling regularly Patient not taking: Reported on 03/16/2023 12/27/20   Thomasene Lot, DO  sertraline (ZOLOFT) 50 MG tablet Take 25 mg by mouth daily.    [provider]  Specialty Vitamins Products (COLLAGEN ULTRA) CAPS as directed Orally    [provider]  Turmeric Curcumin 500 MG CAPS Take 1 capsule by mouth daily.    [provider]  vitamin C (ASCORBIC ACID) 500 MG tablet Take 500 mg by mouth daily.    [provider]  VITAMIN E PO Take 180 mg by mouth.    [provider]    Current Outpatient Medications  Medication Sig Dispense Refill   aspirin EC 81 MG tablet Take 81 mg by mouth daily. Swallow whole.     carvedilol (COREG) 6.25 MG tablet Take 1 tablet (6.25 mg total) by mouth 2 (two) times daily. 180 tablet 3   diphenhydrAMINE (BENADRYL) 25 mg capsule Take 25 mg by mouth daily.     ELDERBERRY PO Take by mouth daily.     famotidine (PEPCID) 40 MG tablet Take 40 mg by mouth as needed.     FLAXSEED, LINSEED, PO Take 1,000 mg by mouth daily.     hydrochlorothiazide (MICROZIDE) 12.5 MG capsule Take 1 capsule (12.5 mg total) by mouth every other day. 45 capsule 3   Krill Oil (OMEGA-3) 500 MG CAPS Take by mouth.     Milk Thistle 1000 MG CAPS once capsule Orally once a day     Na Sulfate-K Sulfate-Mg Sulf 17.5-3.13-1.6 GM/177ML SOLN Take 1 kit by mouth as directed. May use generic SUPREP;NO prior authorizations will be done.Please use Singlecare or GOOD-RX coupon. 354 mL 0   OVER THE COUNTER MEDICATION daily. LIVER HELP TAKE ONE DAILY     pantoprazole (PROTONIX) 40 MG tablet Take 1 tablet (40 mg total) by mouth daily. (Patient not taking: Reported on 10/25/2022) 90 tablet 3   polyethylene glycol (MIRALAX / GLYCOLAX) 17 g packet Take 17 g by mouth 2 (two) times daily. Until stooling regularly 60 packet 0   senna-docusate (SENOKOT-S) 8.6-50 MG tablet Take 2 tablets by mouth 2 (two) times daily. Until stooling regularly (Patient not taking: Reported on 03/16/2023) 60 tablet 0   sertraline (ZOLOFT) 50 MG tablet Take 25 mg by mouth daily.     Specialty Vitamins Products (COLLAGEN ULTRA) CAPS as directed Orally     Turmeric Curcumin 500 MG CAPS Take 1 capsule by mouth daily.     vitamin C (ASCORBIC ACID) 500 MG tablet Take 500 mg by mouth daily.     VITAMIN E PO Take 180 mg by mouth.     Current Facility-Administered Medications  Medication Dose Route Frequency Provider Last Rate Last Admin    0.9 %  sodium chloride infusion  500 mL Intravenous Continuous Taliah Porche, Eleonore Chiquito, MD        Allergies as of 04/12/2023 - Review Complete 04/12/2023  Allergen Reaction Noted   Statins Swelling and Other (See Comments) 03/04/2014   Ace inhibitors  07/06/2017   Losartan  07/21/2016   Tricor [fenofibrate]  07/06/2017   Penicillins Rash     Family History  Problem Relation Age of Onset   Heart disease Mother 73   Hypertension Mother  Hyperlipidemia Mother    Dementia Mother    Thyroid disease Mother    Depression Mother    Colon cancer Father 57   Hypertension Father    Prostate cancer Father    Heart failure Father    Depression Father    Anxiety disorder Father    Colon cancer Sister 69   Colon polyps Brother    Sudden death Maternal Grandfather 45       cerebral hemorrhage   Sudden death Paternal Grandfather 35   Esophageal cancer Neg Hx    Rectal cancer Neg Hx    Stomach cancer Neg Hx    Crohn's disease Neg Hx    Ulcerative colitis Neg Hx     Social History   Socioeconomic History   Marital status: Married    Spouse name: Not on file   Number of children: 1   Years of education: 16   Highest education level: Bachelor's degree (e.g., BA, AB, BS)  Occupational History   Occupation: Retired  Tobacco Use   Smoking status: Never   Smokeless tobacco: Never  Vaping Use   Vaping Use: Never used  Substance and Sexual Activity   Alcohol use: No   Drug use: No   Sexual activity: Not on file  Other Topics Concern   Not on file  Social History Narrative   Lives at home with her husband.   Right-handed.   1-2 cups caffeine per day.   Social Determinants of Health   Financial Resource Strain: Not on file  Food Insecurity: Not on file  Transportation Needs: Not on file  Physical Activity: Not on file  Stress: Not on file  Social Connections: Not on file  Intimate Partner Violence: Not on file    Review of Systems:  All other review of systems  negative except as mentioned in the HPI.  Physical Exam: Vital signs in last 24 hours: Blood Pressure (Abnormal) 184/93   Pulse 90   Temperature 98.6 F (37 C)   Height 5\' 6"  (1.676 m)   Weight 246 lb (111.6 kg)   Oxygen Saturation 98%   Body Mass Index 39.71 kg/m  General:   Alert, NAD Lungs:  Clear .   Heart:  Regular rate and rhythm Abdomen:  Soft, nontender and nondistended. Neuro/Psych:  Alert and cooperative. Normal mood and affect. A and O x 3  Reviewed labs, radiology imaging, old records and pertinent past GI work up  Patient is appropriate for planned procedure(s) and anesthesia in an ambulatory setting   K. Scherry Ran , MD [phone]

## 2023-04-12 NOTE — Progress Notes (Signed)
Pt's states no medical or surgical changes since previsit or office visit. 

## 2023-04-12 NOTE — Patient Instructions (Addendum)
Thank you for letting us take care of your healthcare needs today. Please see handouts given to you on Polyps, Diverticulosis and Hemorrhoids.     YOU HAD AN ENDOSCOPIC PROCEDURE TODAY AT THE [facility]:   Refer to the procedure report that was given to you for any specific questions about what was found during the examination.  If the procedure report does not answer your questions, please call your gastroenterologist to clarify.  If you requested that your care partner not be given the details of your procedure findings, then the procedure report has been included in a sealed envelope for you to review at your convenience later.  YOU SHOULD EXPECT: Some feelings of bloating in the abdomen. Passage of more gas than usual.  Walking can help get rid of the air that was put into your GI tract during the procedure and reduce the bloating. If you had a lower endoscopy (such as a colonoscopy or flexible sigmoidoscopy) you may notice spotting of blood in your stool or on the toilet paper. If you underwent a bowel prep for your procedure, you may not have a normal bowel movement for a few days.  Please Note:  You might notice some irritation and congestion in your nose or some drainage.  This is from the oxygen used during your procedure.  There is no need for concern and it should clear up in a day or so.  SYMPTOMS TO REPORT IMMEDIATELY:  Following lower endoscopy (colonoscopy or flexible sigmoidoscopy):  Excessive amounts of blood in the stool  Significant tenderness or worsening of abdominal pains  Swelling of the abdomen that is new, acute  Fever of 100F or higher   For urgent or emergent issues, a gastroenterologist can be reached at any hour by calling [phone]. Do not use MyChart messaging for urgent concerns.    DIET:  We do recommend a small meal at first, but then you may proceed to your regular diet.  Drink plenty of fluids but you should avoid alcoholic beverages  for 24 hours.  ACTIVITY:  You should plan to take it easy for the rest of today and you should NOT DRIVE or use heavy machinery until tomorrow (because of the sedation medicines used during the test).    FOLLOW UP: Our staff will call the number listed on your records the next business day following your procedure.  We will call around 7:15- 8:00 am to check on you and address any questions or concerns that you may have regarding the information given to you following your procedure. If we do not reach you, we will leave a message.     If any biopsies were taken you will be contacted by phone or by letter within the next 1-3 weeks.  Please call us at [phone] if you have not heard about the biopsies in 3 weeks.    SIGNATURES/CONFIDENTIALITY: You and/or your care partner have signed paperwork which will be entered into your electronic medical record.  These signatures attest to the fact that that the information above on your After Visit Summary has been reviewed and is understood.  Full responsibility of the confidentiality of this discharge information lies with you and/or your care-partner. 

## 2023-04-12 NOTE — Progress Notes (Signed)
Called to room to assist during endoscopic procedure.  Patient ID and intended procedure confirmed with present staff. Received instructions for my participation in the procedure from the performing physician.  

## 2023-04-12 NOTE — Progress Notes (Signed)
Report given to PACU, vss 

## 2023-04-12 NOTE — Op Note (Signed)
[facility] Patient Name: Mason Burleigh Procedure Date: 04/12/2023 10:51 AM MRN: [id number] Endoscopist: Napoleon Form , MD, [id number] Age: 77 Referring MD:  Date of Birth: 03-30-1946 Gender: Female Account #: [id number] Procedure:                Colonoscopy Indications:              Screening in patient at increased risk: Family                            history of 1st-degree relative with colorectal                            cancer, High risk colon cancer surveillance:                            Personal history of colonic polyps, High risk colon                            cancer surveillance: Personal history of multiple                            (3 or more) adenomas Medicines:                Monitored Anesthesia Care Procedure:                Pre-Anesthesia Assessment:                           - Prior to the procedure, a History and Physical                            was performed, and patient medications and                            allergies were reviewed. The patient's tolerance of                            previous anesthesia was also reviewed. The risks                            and benefits of the procedure and the sedation                            options and risks were discussed with the patient.                            All questions were answered, and informed consent                            was obtained. Prior Anticoagulants: The patient has                            taken no anticoagulant or antiplatelet agents. ASA  Grade Assessment: II - A patient with mild systemic                            disease. After reviewing the risks and benefits,                            the patient was deemed in satisfactory condition to                            undergo the procedure.                           After obtaining informed consent, the colonoscope                            was passed under direct vision.  Throughout the                            procedure, the patient's blood pressure, pulse, and                            oxygen saturations were monitored continuously. The                            CF [id number] #[id number] was introduced through the anus                            and advanced to the the cecum, identified by                            appendiceal orifice and ileocecal valve. The                            colonoscopy was performed without difficulty. The                            patient tolerated the procedure well. The quality                            of the bowel preparation was good. The ileocecal                            valve, appendiceal orifice, and rectum were                            photographed. Scope In: 11:18:05 AM Scope Out: 11:35:24 AM Scope Withdrawal Time: 0 hours 11 minutes 39 seconds  Total Procedure Duration: 0 hours 17 minutes 19 seconds  Findings:                 The perianal and digital rectal examinations were                            normal.  Two sessile polyps were found in the cecum. The                            polyps were 3 to 5 mm in size. These polyps were                            removed with a cold snare. Resection and retrieval                            were complete.                           Scattered medium-mouthed and small-mouthed                            diverticula were found in the sigmoid colon and                            descending colon.                           Non-bleeding external and internal hemorrhoids were                            found during retroflexion. The hemorrhoids were                            medium-sized. Complications:            No immediate complications. Estimated Blood Loss:     Estimated blood loss was minimal. Impression:               - Two 3 to 5 mm polyps in the cecum, removed with a                            cold snare. Resected and retrieved.                            - Diverticulosis in the sigmoid colon and in the                            descending colon.                           - Non-bleeding external and internal hemorrhoids. Recommendation:           - Patient has a contact number available for                            emergencies. The signs and symptoms of potential                            delayed complications were discussed with the                            patient. Return to normal activities  tomorrow.                            Written discharge instructions were provided to the                            patient.                           - Resume previous diet.                           - Continue present medications.                           - Await pathology results.                           - No repeat colonoscopy due to age. Napoleon Form, MD 04/12/2023 11:41:55 AM This report has been signed electronically.

## 2023-04-13 ENCOUNTER — Telehealth: Payer: Self-pay

## 2023-04-13 NOTE — Telephone Encounter (Signed)
Follow up call placed, VM obtained and message left. 

## 2023-04-20 ENCOUNTER — Encounter: Payer: Self-pay | Admitting: Gastroenterology

## 2023-05-30 ENCOUNTER — Other Ambulatory Visit: Payer: Self-pay | Admitting: Family Medicine

## 2023-05-30 DIAGNOSIS — Z1231 Encounter for screening mammogram for malignant neoplasm of breast: Secondary | ICD-10-CM

## 2023-06-25 ENCOUNTER — Ambulatory Visit: Admission: RE | Admit: 2023-06-25 | Payer: Medicare Other | Source: Ambulatory Visit

## 2023-06-25 DIAGNOSIS — M8588 Other specified disorders of bone density and structure, other site: Secondary | ICD-10-CM

## 2023-06-25 DIAGNOSIS — E349 Endocrine disorder, unspecified: Secondary | ICD-10-CM | POA: Diagnosis not present

## 2023-06-25 DIAGNOSIS — N958 Other specified menopausal and perimenopausal disorders: Secondary | ICD-10-CM | POA: Diagnosis not present

## 2023-07-16 ENCOUNTER — Ambulatory Visit: Payer: Medicare Other

## 2023-07-25 ENCOUNTER — Ambulatory Visit
Admission: RE | Admit: 2023-07-25 | Discharge: 2023-07-25 | Disposition: A | Discharge status: Home / Self Care | Payer: Medicare Other | Source: Ambulatory Visit | Attending: Family Medicine | Admitting: Family Medicine

## 2023-07-25 DIAGNOSIS — Z1231 Encounter for screening mammogram for malignant neoplasm of breast: Secondary | ICD-10-CM | POA: Diagnosis not present

## 2023-08-14 DIAGNOSIS — I129 Hypertensive chronic kidney disease with stage 1 through stage 4 chronic kidney disease, or unspecified chronic kidney disease: Secondary | ICD-10-CM | POA: Diagnosis not present

## 2023-08-14 DIAGNOSIS — K219 Gastro-esophageal reflux disease without esophagitis: Secondary | ICD-10-CM | POA: Diagnosis not present

## 2023-08-14 DIAGNOSIS — H6120 Impacted cerumen, unspecified ear: Secondary | ICD-10-CM | POA: Diagnosis not present

## 2023-08-14 DIAGNOSIS — E559 Vitamin D deficiency, unspecified: Secondary | ICD-10-CM | POA: Diagnosis not present

## 2023-08-14 DIAGNOSIS — F39 Unspecified mood [affective] disorder: Secondary | ICD-10-CM | POA: Diagnosis not present

## 2023-08-14 DIAGNOSIS — E7800 Pure hypercholesterolemia, unspecified: Secondary | ICD-10-CM | POA: Diagnosis not present

## 2023-08-14 DIAGNOSIS — M1990 Unspecified osteoarthritis, unspecified site: Secondary | ICD-10-CM | POA: Diagnosis not present

## 2023-08-14 DIAGNOSIS — R7303 Prediabetes: Secondary | ICD-10-CM | POA: Diagnosis not present

## 2023-08-14 DIAGNOSIS — M8588 Other specified disorders of bone density and structure, other site: Secondary | ICD-10-CM | POA: Diagnosis not present

## 2023-10-12 ENCOUNTER — Other Ambulatory Visit: Payer: Self-pay | Admitting: Cardiovascular Disease

## 2023-10-30 DIAGNOSIS — Z85828 Personal history of other malignant neoplasm of skin: Secondary | ICD-10-CM | POA: Diagnosis not present

## 2023-10-30 DIAGNOSIS — L720 Epidermal cyst: Secondary | ICD-10-CM | POA: Diagnosis not present

## 2023-10-30 DIAGNOSIS — D2372 Other benign neoplasm of skin of left lower limb, including hip: Secondary | ICD-10-CM | POA: Diagnosis not present

## 2023-10-30 DIAGNOSIS — D2371 Other benign neoplasm of skin of right lower limb, including hip: Secondary | ICD-10-CM | POA: Diagnosis not present

## 2023-10-30 DIAGNOSIS — L821 Other seborrheic keratosis: Secondary | ICD-10-CM | POA: Diagnosis not present

## 2023-10-30 DIAGNOSIS — D225 Melanocytic nevi of trunk: Secondary | ICD-10-CM | POA: Diagnosis not present

## 2023-10-30 DIAGNOSIS — D1801 Hemangioma of skin and subcutaneous tissue: Secondary | ICD-10-CM | POA: Diagnosis not present

## 2023-12-22 ENCOUNTER — Other Ambulatory Visit: Payer: Self-pay | Admitting: Cardiovascular Disease

## 2024-01-05 ENCOUNTER — Other Ambulatory Visit: Payer: Self-pay | Admitting: Cardiovascular Disease

## 2024-02-19 ENCOUNTER — Encounter: Payer: Self-pay | Admitting: Cardiovascular Disease

## 2024-02-19 ENCOUNTER — Ambulatory Visit: Payer: Medicare Other | Attending: Cardiovascular Disease | Admitting: Cardiovascular Disease

## 2024-02-19 VITALS — BP 130/81 | HR 75 | Ht 66.0 in | Wt 251.8 lb

## 2024-02-19 DIAGNOSIS — I10 Essential (primary) hypertension: Secondary | ICD-10-CM | POA: Insufficient documentation

## 2024-02-19 DIAGNOSIS — E782 Mixed hyperlipidemia: Secondary | ICD-10-CM

## 2024-02-19 NOTE — Patient Instructions (Signed)
Medication Instructions:  Your physician recommends that you continue on your current medications as directed. Please refer to the Current Medication list given to you today.  *If you need a refill on your cardiac medications before your next appointment, please call your pharmacy*  Follow-Up: At [facility], you and your health needs are our priority.  As part of our continuing mission to provide you with exceptional heart care, we have created designated Provider Care Teams.  These Care Teams include your primary Cardiologist (physician) and Advanced Practice Providers (APPs -  Physician Assistants and Nurse Practitioners) who all work together to provide you with the care you need, when you need it.  Your next appointment:   1 year(s)  Provider:   Thurmon Fair, MD

## 2024-02-24 ENCOUNTER — Encounter: Payer: Self-pay | Admitting: Cardiovascular Disease

## 2024-02-24 NOTE — Progress Notes (Signed)
 Cardiology Office Note    Date:  02/24/2024   ID:  [name], DOB 01-26-1946, MRN [id number]  PCP:  Irven Coe, MD  Cardiologist:   Thurmon Fair, MD   Chief Complaint  Patient presents with   Hypertension    History of Present Illness:  [name] is a 78 y.o. female with obesity and associated hypertension and hyperlipidemia returning for follow-up.  Her husband Peyton Najjar is also my patient and they are both here today, accompanied by their son, Council Mechanic.  Generally doing well, she has no cardiovascular complaints.  Her blood pressure was a little high when she first checked in at 144/92, but tenderness later it was down to 130/81.  This is pretty typical for her when she comes to the office.  Should always have her blood pressure checked with a large cuff.  She does not have problems with shortness of breath or chest pain with her relatively sedentary lifestyle.  She is held back by orthopedic problems especially in her left knee.  She denies palpitations, dizziness syncope, focal neurological deficits, claudication, lower extremity edema.  She has had a number of drug intolerances:  amlodipine due to ankle swelling and has a history of cough with ACE inhibitors and diarrhea with valsartan and losartan.  Her renal function deteriorated slightly when she took triamterene-hydrochlorothiazide.  Currently her only antihypertensive medication is carvedilol, which she seems to tolerate well.  We tried hydrochlorothiazide every other day, but she did not feel well taking this and has stopped it.  Lipid parameters are better than last year with her LDL cholesterol down to 100 and HDL 44.  Triglycerides remain moderately elevated at 268 she does not have diabetes mellitus, her hemoglobin A1c was 5.1%.  She has normal kidney function.  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  She is allergic to fenofibrate.  It is notable  that on a CT of the chest performed in 2018 when she was already 78 years old there is no visible atherosclerotic calcification in the coronary arteries, carotid arteries, or the thoracic and upper abdominal aorta.  In the past, she has periods of dyspnea that had clear association with gastroesophageal reflux.   She had a normal chest CT angiogram for presumed pulmonary embolism and a normal nuclear stress test in June 2018.  The chest CT, while not a coronary angiogram study, was remarkable for the absence of coronary or aortic calcification.  She had a normal nuclear stress test in 2018.  She had an echocardiogram in September 2019 that showed normal left ventricular systolic function with mild diastolic dysfunction.  She has a strong family history of coronary disease and a personal history of hypertension and hyperlipidemia and borderline diabetes mellitus.  She underwent cardiac catheterization in 2007 with angiographically patent coronary arteries without meaningful atherosclerosis. She has been intolerant to statin medications and fibrates due to severe myalgia.    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   Swelling    Vitamin D deficiency     Past Surgical History:  Procedure Laterality Date   ABDOMINAL HYSTERECTOMY  2001   APPENDECTOMY  1957   BREAST BIOPSY Left    x2   CARDIAC CATHETERIZATION  2007   Patent coronary arteries   CATARACTS Bilateral    COLONOSCOPY  2020   Dr.Nandigam   KNEE ARTHROSCOPY Right 1999   LAPAROSCOPIC  CHOLECYSTECTOMY  2007   POLYPECTOMY     TONSILECTOMY/ADENOIDECTOMY WITH MYRINGOTOMY  1963   TOTAL KNEE ARTHROPLASTY Right 07/21/2016   Procedure: RIGHT TOTAL KNEE ARTHROPLASTY;  Surgeon: Beverely Low, MD;  Location: [facility] OR;  Service: Orthopedics;  Laterality: Right;   TOTAL KNEE ARTHROPLASTY     right    Current Medications: Outpatient Medications Prior to Visit  Medication Sig Dispense Refill   aspirin EC 81 MG tablet Take 81 mg by mouth daily. Swallow whole.     carvedilol (COREG) 6.25 MG tablet TAKE 1 TABLET BY MOUTH TWICE A DAY 180 tablet 3   diphenhydrAMINE (BENADRYL) 25 mg capsule Take 25 mg by mouth daily.     ELDERBERRY PO Take by mouth daily.     FLAXSEED, LINSEED, PO Take 1,000 mg by mouth daily.     Krill Oil (OMEGA-3) 500 MG CAPS Take by mouth.     Milk Thistle 1000 MG CAPS once capsule Orally once a day     OVER THE COUNTER MEDICATION daily. LIVER HELP TAKE ONE DAILY     pantoprazole (PROTONIX) 40 MG tablet Take 1 tablet (40 mg total) by mouth daily. 90 tablet 3   polyethylene glycol (MIRALAX / GLYCOLAX) 17 g packet Take 17 g by mouth 2 (two) times daily. Until stooling regularly 60 packet 0   senna-docusate (SENOKOT-S) 8.6-50 MG tablet Take 2 tablets by mouth 2 (two) times daily. Until stooling regularly 60 tablet 0   sertraline (ZOLOFT) 50 MG tablet Take 25 mg by mouth daily.     Specialty Vitamins Products (COLLAGEN ULTRA) CAPS as directed Orally     Turmeric Curcumin 500 MG CAPS Take 1 capsule by mouth daily.     hydrochlorothiazide (MICROZIDE) 12.5 MG capsule Take 1 capsule (12.5 mg total) by mouth every other day. Please keep upcoming appointment for future refills. First attempt 45 capsule 0   Magnesium 300 MG CAPS Take 300 mg by mouth at bedtime.     sertraline (ZOLOFT) 25 MG tablet Take 25 mg by mouth daily.     famotidine (PEPCID) 40 MG tablet Take 40 mg by mouth as needed. (Patient not taking: Reported on 02/19/2024)     vitamin C (ASCORBIC ACID) 500 MG tablet Take  500 mg by mouth daily. (Patient not taking: Reported on 04/12/2023)     VITAMIN E PO Take 180 mg by mouth.     No facility-administered medications prior to visit.     Allergies:   Statins, Ace inhibitors, Fenofibrate, Losartan, Triamterene, Valsartan, and Penicillins   Social History   Socioeconomic History   Marital status: Married    Spouse name: Not on file   Number of children: 1   Years of education: 16   Highest education level: Bachelor's degree (e.g., BA, AB, BS)  Occupational History   Occupation: Retired  Tobacco Use   Smoking status: Never   Smokeless tobacco: Never  Vaping  Use   Vaping status: Never Used  Substance and Sexual Activity   Alcohol use: No   Drug use: No   Sexual activity: Not on file  Other Topics Concern   Not on file  Social History Narrative   Lives at home with her husband.   Right-handed.   1-2 cups caffeine per day.   Social Drivers of Corporate investment banker Strain: Not on file  Food Insecurity: Not on file  Transportation Needs: Not on file  Physical Activity: Not on file  Stress: Not on file  Social Connections: Not on file     Family History:  The patient's family history includes Anxiety disorder in her father; Colon cancer (age of onset: 81) in her sister; Colon cancer (age of onset: 35) in her father; Colon polyps in her brother; Dementia in her mother; Depression in her father and mother; Heart disease (age of onset: 34) in her mother; Heart failure in her father; Hyperlipidemia in her mother; Hypertension in her father and mother; Prostate cancer in her father; Sudden death (age of onset: 87) in her paternal grandfather; Sudden death (age of onset: 59) in her maternal grandfather; Thyroid disease in her mother.   ROS:   Please see the history of present illness.    ROS all other systems are reviewed and are negative  PHYSICAL EXAM:   VS:  BP 130/81   Pulse 75   Ht 5\' 6"  (1.676 m)   Wt 251 lb 12.8 oz (114.2 kg)   SpO2  96%   BMI 40.64 kg/m      General: Alert, oriented x3, no distress, severely obese Head: no evidence of trauma, PERRL, EOMI, no exophtalmos or lid lag, no myxedema, no xanthelasma; normal ears, nose and oropharynx Neck: normal jugular venous pulsations and no hepatojugular reflux; brisk carotid pulses without delay and no carotid bruits Chest: clear to auscultation, no signs of consolidation by percussion or palpation, normal fremitus, symmetrical and full respiratory excursions Cardiovascular: normal position and quality of the apical impulse, regular rhythm, normal first and second heart sounds, no murmurs, rubs or gallops Abdomen: no tenderness or distention, no masses by palpation, no abnormal pulsatility or arterial bruits, normal bowel sounds, no hepatosplenomegaly Extremities: no clubbing, cyanosis or edema; 2+ radial, ulnar and brachial pulses bilaterally; 2+ right femoral, posterior tibial and dorsalis pedis pulses; 2+ left femoral, posterior tibial and dorsalis pedis pulses; no subclavian or femoral bruits Neurological: grossly nonfocal Psych: Normal mood and affect      Wt Readings from Last 3 Encounters:  02/19/24 251 lb 12.8 oz (114.2 kg)  04/12/23 246 lb (111.6 kg)  03/16/23 246 lb (111.6 kg)      Studies/Labs Reviewed:   ECHO 08/28/2018: - Left ventricle: The cavity size was normal. Wall thickness was    increased in a pattern of moderate LVH. Systolic function was    normal. The estimated ejection fraction was in the range of 60%    to 65%. Wall motion was normal; there were no regional wall    motion abnormalities. Doppler parameters are consistent with    abnormal left ventricular relaxation (grade 1 diastolic    dysfunction). The E/e&' ratio is between 8-15, suggesting    indeterminate LV filling pressure.  - Mitral valve: Mildly thickened leaflets . There was trivial    regurgitation.  - Left atrium: The atrium was normal in size.  - Tricuspid valve: There  was trivial regurgitation.  - Pulmonary arteries: PA peak pressure:  27 mm Hg (S).  - Inferior vena cava: The vessel was normal in size. The    respirophasic diameter changes were in the normal range (>= 50%),    consistent with normal central venous pressure.   Impressions:   - LVEF 60-65%, moderate LVH, normal wall motion, grade 1 DD,    indeterminate LV filling pressure, trivial MR, norma LA size,    trivial TR, RVSP 27 mmHg, normal IVC.   EKG:    EKG Interpretation Date/Time:  Tuesday February 19 2024 14:20:38 EDT Ventricular Rate:  75 PR Interval:  194 QRS Duration:  82 QT Interval:  380 QTC Calculation: 424 R Axis:   -34  Text Interpretation: Normal sinus rhythm Left axis deviation Minimal voltage criteria for LVH, may be normal variant ( R in aVL ) When compared with ECG of 28-May-2017 04:46, Vent. rate has decreased BY  39 BPM Confirmed by Dailey Alberson [phone]) on 02/19/2024 2:30:12 PM          Recent Labs: Dr. Valentina Lucks, 06/25/2019 Total cholesterol 208, LDL 123, HDL 48, triglycerides of 91 Hemoglobin A1c 5.3%, creatinine 0.88, potassium 4.5, normal liver function tests  09/02/2020 Total cholesterol 199, HDL 45, LDL 119, triglycerides 202 Hemoglobin A1c 5.4%, creatinine 0.91, potassium 4.5, normal liver function tests and normal TSH  07/04/2021 Cholesterol 190, HDL 39, LDL 117, triglycerides 196 Hemoglobin A1c 5.9%, creatinine 0.97, potassium 4.5, normal liver function tests and normal TSH  07/10/2022 Cholesterol 192, HDL 38, LDL 109, triglycerides 255 Hemoglobin A1c 5.1%, creatinine 1.11, potassium 4.5, ALT 10, TSH 1.210  01/16/2024 Cholesterol 189, HDL 44, LDL 100, triglycerides 268 Hemoglobin A1c 5.1% Hemoglobin 16.7  ASSESSMENT:    1. Essential hypertension   2. Mixed hyperlipidemia   3. Severe obesity (BMI 35.0-39.9) with comorbidity (HCC)      PLAN:  In order of problems listed above:  HTN: It has been difficult to find a perfect blood pressure  control regimen.  She had fatigue and sleepiness with carvedilol at higher doses.  Intolerance to ACE inhibitors and ARB and calcium channel blockers due to various side effects.  Worsening renal function with triamterene.  She felt poorly when taking hydrochlorothiazide, even prescribed every other day.  Current blood pressure is acceptable.  Make sure to check her blood pressure with a large cuff. HLP: She does not have known CAD or PAD or diabetes mellitus.  Her LDL is much better this year, just reaching her target of 100 or less.  History of statin myopathy after trying multiple agents.  Continue with lifestyle changes.  We once again reviewed that high glycemic index carbohydrates are more harmful (one of her weaknesses is Posta). Severe obesity: Weight is on the borderline of morbid obesity.  She has lifelong frustration with attempts at weight loss. Exertional dyspnea: Previous cardiac work-up with echocardiography and coronary angiography as well as CT of the chest with contrast have not shown abnormalities that could be implicated.  Clearly her weight is part of the problem.  Note relationship with symptoms of GERD in the past.  It is reasonable to consider a pulmonary evaluation.    Medication Adjustments/Labs and Tests Ordered: Current medicines are reviewed at length with the patient today.  Concerns regarding medicines are outlined above.  Medication changes, Labs and Tests ordered today are listed in the Patient Instructions below. Patient Instructions  Medication Instructions:  Your physician recommends that you continue on your current medications as directed. Please refer to the Current Medication list given to  you today.  *If you need a refill on your cardiac medications before your next appointment, please call your pharmacy*   Follow-Up: At [facility], you and your health needs are our priority.  As part of our continuing mission to provide you with exceptional heart  care, we have created designated Provider Care Teams.  These Care Teams include your primary Cardiologist (physician) and Advanced Practice Providers (APPs -  Physician Assistants and Nurse Practitioners) who all work together to provide you with the care you need, when you need it.   Your next appointment:   1 year(s)  Provider:   Thurmon Fair, MD        Signed, Thurmon Fair, MD  02/24/2024 3:00 PM    [facility] [address] [place], [place], [place]  [address] Phone: [phone]; Fax: [phone]

## 2024-04-06 ENCOUNTER — Other Ambulatory Visit: Payer: Self-pay | Admitting: Cardiovascular Disease

## 2024-05-06 ENCOUNTER — Encounter: Payer: Self-pay | Admitting: Family Medicine

## 2024-05-06 ENCOUNTER — Other Ambulatory Visit: Payer: Self-pay | Admitting: Family Medicine

## 2024-05-06 DIAGNOSIS — R0789 Other chest pain: Secondary | ICD-10-CM

## 2024-05-12 ENCOUNTER — Ambulatory Visit
Admission: RE | Admit: 2024-05-12 | Discharge: 2024-05-12 | Disposition: A | Discharge status: Home / Self Care | Source: Ambulatory Visit | Attending: Family Medicine | Admitting: Family Medicine

## 2024-05-12 DIAGNOSIS — R0789 Other chest pain: Secondary | ICD-10-CM

## 2024-05-12 MED ORDER — IOPAMIDOL (ISOVUE-370) INJECTION 76%
75.0000 mL | Freq: Once | INTRAVENOUS | Status: AC | PRN
  Administered 2024-05-12: 75 mL via INTRAVENOUS

## 2024-05-20 ENCOUNTER — Other Ambulatory Visit: Payer: Self-pay | Admitting: Family Medicine

## 2024-05-20 ENCOUNTER — Encounter: Payer: Self-pay | Admitting: Family Medicine

## 2024-05-20 DIAGNOSIS — N281 Cyst of kidney, acquired: Secondary | ICD-10-CM

## 2024-05-22 ENCOUNTER — Ambulatory Visit
Admission: RE | Admit: 2024-05-22 | Discharge: 2024-05-22 | Disposition: A | Discharge status: Home / Self Care | Source: Ambulatory Visit | Attending: Family Medicine | Admitting: Family Medicine

## 2024-05-22 DIAGNOSIS — N281 Cyst of kidney, acquired: Secondary | ICD-10-CM

## 2024-05-22 MED ORDER — IOPAMIDOL (ISOVUE-300) INJECTION 61%
100.0000 mL | Freq: Once | INTRAVENOUS | Status: AC | PRN
  Administered 2024-05-22: 100 mL via INTRAVENOUS

## 2024-05-28 ENCOUNTER — Other Ambulatory Visit

## 2024-06-10 ENCOUNTER — Other Ambulatory Visit: Payer: Self-pay | Admitting: Family Medicine

## 2024-06-10 DIAGNOSIS — Z0000 Encounter for general adult medical examination without abnormal findings: Secondary | ICD-10-CM

## 2024-07-25 ENCOUNTER — Ambulatory Visit
Admission: RE | Admit: 2024-07-25 | Discharge: 2024-07-25 | Disposition: A | Discharge status: Home / Self Care | Source: Ambulatory Visit | Attending: Family Medicine | Admitting: Family Medicine

## 2024-07-25 DIAGNOSIS — Z0000 Encounter for general adult medical examination without abnormal findings: Secondary | ICD-10-CM

## 2024-09-29 ENCOUNTER — Other Ambulatory Visit: Payer: Self-pay | Admitting: Urology

## 2024-09-29 DIAGNOSIS — N281 Cyst of kidney, acquired: Secondary | ICD-10-CM

## 2024-10-02 ENCOUNTER — Other Ambulatory Visit: Payer: Self-pay | Admitting: Family Medicine

## 2024-10-02 DIAGNOSIS — M5430 Sciatica, unspecified side: Secondary | ICD-10-CM

## 2024-11-19 ENCOUNTER — Other Ambulatory Visit

## 2024-11-19 ENCOUNTER — Other Ambulatory Visit: Payer: Self-pay | Admitting: Cardiovascular Disease

## 2024-12-23 ENCOUNTER — Ambulatory Visit
Admission: RE | Admit: 2024-12-23 | Discharge: 2024-12-23 | Disposition: A | Source: Ambulatory Visit | Attending: Family Medicine | Admitting: Family Medicine

## 2024-12-23 ENCOUNTER — Encounter: Payer: Self-pay | Admitting: Urology

## 2024-12-23 ENCOUNTER — Ambulatory Visit
Admission: RE | Admit: 2024-12-23 | Discharge: 2024-12-23 | Disposition: A | Source: Ambulatory Visit | Attending: Urology

## 2024-12-23 DIAGNOSIS — M5430 Sciatica, unspecified side: Secondary | ICD-10-CM

## 2024-12-23 DIAGNOSIS — N281 Cyst of kidney, acquired: Secondary | ICD-10-CM

## 2024-12-23 MED ORDER — GADOPICLENOL 0.5 MMOL/ML IV SOLN
10.0000 mL | Freq: Once | INTRAVENOUS | Status: AC | PRN
  Administered 2024-12-23: 10 mL via INTRAVENOUS

## 2025-04-15 ENCOUNTER — Ambulatory Visit: Admitting: Cardiovascular Disease
